# Patient Record
Sex: Male | Born: 1970 | Race: White | Hispanic: No | Marital: Married | Smoking: Never smoker
Health system: Southern US, Community
[De-identification: ages and names within clinical notes are randomized; demographics above are authoritative.]

## PROBLEM LIST (undated history)

## (undated) DIAGNOSIS — J45909 Unspecified asthma, uncomplicated: Secondary | ICD-10-CM

## (undated) DIAGNOSIS — E781 Pure hyperglyceridemia: Secondary | ICD-10-CM

## (undated) DIAGNOSIS — E785 Hyperlipidemia, unspecified: Secondary | ICD-10-CM

## (undated) DIAGNOSIS — H9192 Unspecified hearing loss, left ear: Secondary | ICD-10-CM

## (undated) DIAGNOSIS — I10 Essential (primary) hypertension: Secondary | ICD-10-CM

## (undated) DIAGNOSIS — F419 Anxiety disorder, unspecified: Secondary | ICD-10-CM

## (undated) DIAGNOSIS — IMO0002 Reserved for concepts with insufficient information to code with codable children: Secondary | ICD-10-CM

## (undated) DIAGNOSIS — N529 Male erectile dysfunction, unspecified: Secondary | ICD-10-CM

## (undated) DIAGNOSIS — T7840XA Allergy, unspecified, initial encounter: Secondary | ICD-10-CM

## (undated) DIAGNOSIS — G4700 Insomnia, unspecified: Secondary | ICD-10-CM

## (undated) HISTORY — DX: Reserved for concepts with insufficient information to code with codable children: IMO0002

## (undated) HISTORY — DX: Essential (primary) hypertension: I10

## (undated) HISTORY — PX: TONSILLECTOMY: SUR1361

## (undated) HISTORY — DX: Allergy, unspecified, initial encounter: T78.40XA

## (undated) HISTORY — DX: Anxiety disorder, unspecified: F41.9

## (undated) HISTORY — DX: Male erectile dysfunction, unspecified: N52.9

## (undated) HISTORY — DX: Pure hyperglyceridemia: E78.1

## (undated) HISTORY — DX: Insomnia, unspecified: G47.00

## (undated) HISTORY — DX: Unspecified asthma, uncomplicated: J45.909

## (undated) HISTORY — DX: Hyperlipidemia, unspecified: E78.5

## (undated) HISTORY — PX: WISDOM TOOTH EXTRACTION: SHX21

## (undated) HISTORY — PX: SPINAL CORD STIMULATOR IMPLANT: SHX2422

---

## 2000-10-01 ENCOUNTER — Encounter: Payer: Self-pay | Admitting: Family Medicine

## 2000-10-01 ENCOUNTER — Ambulatory Visit (HOSPITAL_COMMUNITY): Admission: RE | Admit: 2000-10-01 | Discharge: 2000-10-01 | Payer: Self-pay | Admitting: Family Medicine

## 2010-04-09 ENCOUNTER — Other Ambulatory Visit (HOSPITAL_COMMUNITY): Payer: Self-pay | Admitting: Orthopaedic Surgery

## 2010-04-09 DIAGNOSIS — M25579 Pain in unspecified ankle and joints of unspecified foot: Secondary | ICD-10-CM

## 2010-04-10 ENCOUNTER — Ambulatory Visit (HOSPITAL_COMMUNITY)
Admission: RE | Admit: 2010-04-10 | Discharge: 2010-04-10 | Disposition: A | Discharge status: Home / Self Care | Payer: BC Managed Care – PPO | Source: Ambulatory Visit | Attending: Orthopaedic Surgery | Admitting: Orthopaedic Surgery

## 2010-04-10 DIAGNOSIS — Z13820 Encounter for screening for osteoporosis: Secondary | ICD-10-CM | POA: Insufficient documentation

## 2010-04-10 DIAGNOSIS — M25579 Pain in unspecified ankle and joints of unspecified foot: Secondary | ICD-10-CM

## 2012-05-13 ENCOUNTER — Other Ambulatory Visit: Payer: Self-pay | Admitting: Nurse Practitioner

## 2012-05-16 ENCOUNTER — Other Ambulatory Visit: Payer: Self-pay | Admitting: *Deleted

## 2012-05-16 MED ORDER — ENALAPRIL MALEATE 10 MG PO TABS: 10.0000 mg | ORAL_TABLET | Freq: Every day | ORAL | Status: DC

## 2012-06-03 ENCOUNTER — Ambulatory Visit (INDEPENDENT_AMBULATORY_CARE_PROVIDER_SITE_OTHER): Payer: BC Managed Care – PPO | Admitting: Family Medicine

## 2012-06-03 ENCOUNTER — Encounter: Payer: Self-pay | Admitting: Family Medicine

## 2012-06-03 VITALS — BP 112/88 | Temp 99.0°F | Wt 233.2 lb

## 2012-06-03 DIAGNOSIS — J309 Allergic rhinitis, unspecified: Secondary | ICD-10-CM

## 2012-06-03 MED ORDER — METHYLPREDNISOLONE ACETATE 80 MG/ML IJ SUSP
80.0000 mg | Freq: Once | INTRAMUSCULAR | Status: AC
  Administered 2012-06-03: 80 mg via INTRAMUSCULAR

## 2012-06-03 MED ORDER — KETOCONAZOLE 2 % EX CREA: TOPICAL_CREAM | Freq: Two times a day (BID) | CUTANEOUS | Status: DC

## 2012-06-03 NOTE — Progress Notes (Signed)
  Subjective:    Patient ID: Trevor Rollins, male    DOB: 1971-01-10, 42 y.o.   MRN: [id number]  HPI Sig hx allergies. Due to start shots soon. On loratadine plus flonase. Benadryl. Ongoing cong and drainage despite. Little itchy in the eyes, but not bad.  Patient also has challenge with fungus on feet. Over-the-counter meds help a little but not a lot. Keeps coming back. Review of Systems    no fever, no chest pain, no dyspnea. ROS otherwise negative. Objective:   Physical Exam  Alert slight malaise. Vitals reviewed. HEENT significant nasal congestion I injection slight drainage neck supple. Lungs clear. Heart regular in rhythm. Feet Tenia pedis changes      Assessment & Plan:  Impression #1 allergic rhinitis resistant to antihistamines posteriorly nasal spray. Patient considering injections soon. #2 tenia pedis. Plan steroid injection today. Ketoconazole cream when necessary. Symptomatic care discussed. WSL

## 2012-08-01 ENCOUNTER — Encounter: Payer: Self-pay | Admitting: *Deleted

## 2012-08-02 ENCOUNTER — Encounter: Payer: Self-pay | Admitting: Family Medicine

## 2012-08-02 ENCOUNTER — Ambulatory Visit (INDEPENDENT_AMBULATORY_CARE_PROVIDER_SITE_OTHER): Payer: BC Managed Care – PPO | Admitting: Family Medicine

## 2012-08-02 ENCOUNTER — Encounter: Payer: Self-pay | Admitting: *Deleted

## 2012-08-02 VITALS — BP 134/81 | Temp 98.7°F | Wt 239.0 lb

## 2012-08-02 DIAGNOSIS — R059 Cough, unspecified: Secondary | ICD-10-CM

## 2012-08-02 DIAGNOSIS — F654 Pedophilia: Secondary | ICD-10-CM

## 2012-08-02 DIAGNOSIS — R05 Cough: Secondary | ICD-10-CM

## 2012-08-02 DIAGNOSIS — R5383 Other fatigue: Secondary | ICD-10-CM

## 2012-08-02 DIAGNOSIS — R5381 Other malaise: Secondary | ICD-10-CM

## 2012-08-02 DIAGNOSIS — J309 Allergic rhinitis, unspecified: Secondary | ICD-10-CM

## 2012-08-02 DIAGNOSIS — E785 Hyperlipidemia, unspecified: Secondary | ICD-10-CM

## 2012-08-02 DIAGNOSIS — F528 Other sexual dysfunction not due to a substance or known physiological condition: Secondary | ICD-10-CM

## 2012-08-02 DIAGNOSIS — Z79899 Other long term (current) drug therapy: Secondary | ICD-10-CM

## 2012-08-02 MED ORDER — METHYLPREDNISOLONE ACETATE 80 MG/ML IJ SUSP
80.0000 mg | Freq: Once | INTRAMUSCULAR | Status: AC
  Administered 2012-08-02: 80 mg via INTRAMUSCULAR

## 2012-08-02 MED ORDER — CLARITHROMYCIN 500 MG PO TABS: 500.0000 mg | ORAL_TABLET | Freq: Two times a day (BID) | ORAL | Status: AC

## 2012-08-02 NOTE — Progress Notes (Signed)
  Subjective:    Patient ID: Trevor Rollins, male    DOB: [birth year], 42 y.o.   MRN: [id number]  Cough This is a recurrent problem. The current episode started in the past 7 days. The problem has been gradually worsening. The problem occurs every few minutes. The cough is productive of purulent sputum. Pertinent negatives include no chest pain or chills. Nothing aggravates the symptoms. Risk factors for lung disease include animal exposure. He has tried nothing for the symptoms. The treatment provided moderate relief.   Started after bushhogging Loratadine and benadryl and flonase  Review of Systems  Constitutional: Negative for chills.  Respiratory: Positive for cough.   Cardiovascular: Negative for chest pain.       Objective:   Physical Exam Alert no apparent distress. Clear his throat intermittently during exam. HEENT mild nasal congestion. Pharynx normal lungs rare rhonchi heart regular in rhythm.       Assessment & Plan:  Impression rhinitis/bronchitis with both allergenic and infectious component. Plan Biaxin 500 twice a day 10 days. Maintain Flonase. Steroid injection. Patient to consider getting back with allergy specialist regarding shots.

## 2012-08-02 NOTE — Patient Instructions (Signed)
Do blood work fasting about a week before next visit

## 2012-08-19 LAB — BASIC METABOLIC PANEL
BUN: 21 mg/dL (ref 6–23)
CO2: 25 mEq/L (ref 19–32)
Calcium: 9.1 mg/dL (ref 8.4–10.5)
Chloride: 102 mEq/L (ref 96–112)
Creat: 0.93 mg/dL (ref 0.50–1.35)
Glucose, Bld: 98 mg/dL (ref 70–99)
Potassium: 4.3 mEq/L (ref 3.5–5.3)
Sodium: 139 mEq/L (ref 135–145)

## 2012-08-19 LAB — LIPID PANEL
Cholesterol: 199 mg/dL (ref 0–200)
HDL: 48 mg/dL (ref >39)
LDL Cholesterol: 109 mg/dL — ABNORMAL HIGH (ref 0–99)
Total CHOL/HDL Ratio: 4.1 Ratio
Triglycerides: 211 mg/dL — ABNORMAL HIGH (ref <150)
VLDL: 42 mg/dL — ABNORMAL HIGH (ref 0–40)

## 2012-08-19 LAB — HEPATIC FUNCTION PANEL
ALT: 49 U/L (ref 0–53)
AST: 35 U/L (ref 0–37)
Albumin: 4 g/dL (ref 3.5–5.2)
Alkaline Phosphatase: 83 U/L (ref 39–117)
Bilirubin, Direct: 0.1 mg/dL (ref 0.0–0.3)
Indirect Bilirubin: 0.4 mg/dL (ref 0.0–0.9)
Total Bilirubin: 0.5 mg/dL (ref 0.3–1.2)
Total Protein: 6.5 g/dL (ref 6.0–8.3)

## 2012-08-19 LAB — TESTOSTERONE: Testosterone: 277 ng/dL — ABNORMAL LOW (ref 300–890)

## 2012-08-31 ENCOUNTER — Ambulatory Visit: Payer: BC Managed Care – PPO | Admitting: Family Medicine

## 2012-09-08 ENCOUNTER — Encounter: Payer: Self-pay | Admitting: Family Medicine

## 2012-09-08 ENCOUNTER — Ambulatory Visit (INDEPENDENT_AMBULATORY_CARE_PROVIDER_SITE_OTHER): Payer: BC Managed Care – PPO | Admitting: Family Medicine

## 2012-09-08 VITALS — BP 142/94 | HR 110 | Wt 237.6 lb

## 2012-09-08 DIAGNOSIS — I10 Essential (primary) hypertension: Secondary | ICD-10-CM

## 2012-09-08 DIAGNOSIS — E291 Testicular hypofunction: Secondary | ICD-10-CM

## 2012-09-08 DIAGNOSIS — N529 Male erectile dysfunction, unspecified: Secondary | ICD-10-CM | POA: Insufficient documentation

## 2012-09-08 DIAGNOSIS — F411 Generalized anxiety disorder: Secondary | ICD-10-CM

## 2012-09-08 DIAGNOSIS — E785 Hyperlipidemia, unspecified: Secondary | ICD-10-CM

## 2012-09-08 DIAGNOSIS — J4520 Mild intermittent asthma, uncomplicated: Secondary | ICD-10-CM

## 2012-09-08 DIAGNOSIS — R7989 Other specified abnormal findings of blood chemistry: Secondary | ICD-10-CM | POA: Insufficient documentation

## 2012-09-08 DIAGNOSIS — J45909 Unspecified asthma, uncomplicated: Secondary | ICD-10-CM | POA: Insufficient documentation

## 2012-09-08 MED ORDER — FLUTICASONE PROPIONATE 50 MCG/ACT NA SUSP: 2.0000 | Freq: Every day | NASAL | Status: DC

## 2012-09-08 MED ORDER — ENALAPRIL MALEATE 10 MG PO TABS: 10.0000 mg | ORAL_TABLET | Freq: Every day | ORAL | Status: DC

## 2012-09-08 MED ORDER — CITALOPRAM HYDROBROMIDE 20 MG PO TABS: ORAL_TABLET | ORAL | Status: DC

## 2012-09-08 NOTE — Progress Notes (Signed)
  Subjective:    Patient ID: Trevor Rollins, male    DOB: [birth year], 42 y.o.   MRN: [id number]  HPI Results for orders placed in visit on 08/02/12  BASIC METABOLIC PANEL      Result Value Range   Sodium 139  135 - 145 mEq/L   Potassium 4.3  3.5 - 5.3 mEq/L   Chloride 102  96 - 112 mEq/L   CO2 25  19 - 32 mEq/L   Glucose, Bld 98  70 - 99 mg/dL   BUN 21  6 - 23 mg/dL   Creat 7.82  9.56 - 2.13 mg/dL   Calcium 9.1  8.4 - 08.6 mg/dL  TESTOSTERONE      Result Value Range   Testosterone 277 (*) 300 - 890 ng/dL  LIPID PANEL      Result Value Range   Cholesterol 199  0 - 200 mg/dL   Triglycerides 578 (*) <150 mg/dL   HDL 48  >46 mg/dL   Total CHOL/HDL Ratio 4.1     VLDL 42 (*) 0 - 40 mg/dL   LDL Cholesterol 962 (*) 0 - 99 mg/dL  HEPATIC FUNCTION PANEL      Result Value Range   Total Bilirubin 0.5  0.3 - 1.2 mg/dL   Bilirubin, Direct 0.1  0.0 - 0.3 mg/dL   Indirect Bilirubin 0.4  0.0 - 0.9 mg/dL   Alkaline Phosphatase 83  39 - 117 U/L   AST 35  0 - 37 U/L   ALT 49  0 - 53 U/L   Total Protein 6.5  6.0 - 8.3 g/dL   Albumin 4.0  3.5 - 5.2 g/dL   Exercise regular, watching it closely.  Diet overall not very good. Compliant with meds  Increased stress lately led to increased snacking  Gained weight.  Energy level not the best  celexa 40 patient has been taking just one quarter of a tablet of this daily.  Patient notes diminished libido. Also diminished ability to maintain a good correction. This is been coming on for some time. Patient's spouse was wondering about testosterone level.  Still significant stress at work though somewhat improved.  Review of Systems No chest pain no headache decent appetite no abdominal pain no change in bowel habits no blood in stools alert    Objective:   Physical Exam  Alert no acute distress. HEENT normal. Blood pressure good on repeat. Lungs clear. Heart regular rate and rhythm.      Assessment & Plan:  Impression #1 hypertension good  control. #2 chronic rhinitis good control. #3 hyperlipidemia discussed improved. #4 depression improved stable on low-dose medicine. #5 diminished libido #6 low testosterone discussed at great length. I am not in favor of supplementing with just mild decline in levels. Discussed at length with patient. Plan maintain usual meds. Decrease Celexa to one half 20 daily. Diet exercise discussed in encourage. Viagra 50 mg #8 one 2 hours before sex side effects benefits discussed easily 40 minutes spent with patient most in discussion. WSL

## 2012-12-19 ENCOUNTER — Encounter: Payer: Self-pay | Admitting: Family Medicine

## 2012-12-19 ENCOUNTER — Ambulatory Visit (INDEPENDENT_AMBULATORY_CARE_PROVIDER_SITE_OTHER): Payer: BC Managed Care – PPO | Admitting: Family Medicine

## 2012-12-19 VITALS — BP 110/80 | Temp 98.5°F | Ht 74.0 in | Wt 245.0 lb

## 2012-12-19 DIAGNOSIS — J329 Chronic sinusitis, unspecified: Secondary | ICD-10-CM

## 2012-12-19 MED ORDER — LEVOFLOXACIN 500 MG PO TABS: 500.0000 mg | ORAL_TABLET | Freq: Every day | ORAL | Status: AC

## 2012-12-19 NOTE — Progress Notes (Signed)
  Subjective:    Patient ID: Trevor Rollins, male    DOB: [birth year], 42 y.o.   MRN: [id number]  URI  This is a new problem. The current episode started 1 to 4 weeks ago. The problem has been unchanged. There has been no fever. Associated symptoms include congestion and coughing. He has tried antihistamine for the symptoms. The treatment provided no relief.    Out doors a lot cough  definietly worse at night  Uses benadryl, adding hydromet at night helps.  Using the loratadine and flonase, some uri symptoms in the family,   Cough productive   Review of Systems  HENT: Positive for congestion.   Respiratory: Positive for cough.    no vomiting no diarrhea no rash. ROS otherwise negative.   Objective:   Physical Exam  Alert HEENT moderate his congestion some frontal tenderness. Pharynx slight erythema neck supple. Lungs clear heart regular in rhythm.         Assessment & Plan:  Impression rhinosinusitis with secondary cough. Plan Hydromet 1 teaspoon each bedtime when necessary. Levaquin 500 daily 10 days. Symptomatic care discussed. WSL

## 2012-12-29 ENCOUNTER — Telehealth: Payer: Self-pay | Admitting: Family Medicine

## 2012-12-29 MED ORDER — PREDNISONE 20 MG PO TABS: ORAL_TABLET | ORAL | Status: DC

## 2012-12-29 NOTE — Telephone Encounter (Signed)
pred 20 three qd for three d, two qd for three d one qd for two d

## 2012-12-29 NOTE — Telephone Encounter (Signed)
States he is not feeling any better.  Can something else be prescribed?  Patient states maybe Prednisone will work  Scientist, forensic in [employer]

## 2012-12-29 NOTE — Telephone Encounter (Signed)
Med sent to pharm. Pt notified.  

## 2013-06-21 ENCOUNTER — Ambulatory Visit: Payer: BC Managed Care – PPO | Admitting: Family Medicine

## 2013-06-26 ENCOUNTER — Ambulatory Visit (INDEPENDENT_AMBULATORY_CARE_PROVIDER_SITE_OTHER): Payer: BC Managed Care – PPO | Admitting: Family Medicine

## 2013-06-26 ENCOUNTER — Encounter: Payer: Self-pay | Admitting: Family Medicine

## 2013-06-26 VITALS — BP 142/90 | Temp 98.3°F | Ht 74.0 in | Wt 246.0 lb

## 2013-06-26 DIAGNOSIS — J309 Allergic rhinitis, unspecified: Secondary | ICD-10-CM

## 2013-06-26 MED ORDER — SILDENAFIL CITRATE 20 MG PO TABS: ORAL_TABLET | ORAL | Status: DC

## 2013-06-26 MED ORDER — METHYLPREDNISOLONE ACETATE 40 MG/ML IJ SUSP
80.0000 mg | Freq: Once | INTRAMUSCULAR | Status: AC
  Administered 2013-06-26: 80 mg via INTRAMUSCULAR

## 2013-06-26 NOTE — Progress Notes (Signed)
   Subjective:    Patient ID: Trevor Rollins, male    DOB: 08/07/1970, 43 y.o.   MRN: [id number]  HPI Patient is here today for environmental allergies.  He has headaches, aching all over., alswo sig stress with job  Using the inhaler for wheezing, Some prod cough but not much  Congestion  Cough  He does get allergy shots once a week.  No concerns/questions  Re emerged past couple weeks, on the go,  Patient also notes ongoing challenges with erectile dysfunction. Would like to try generic Viagra.  Review of Systems No fever no chills no nausea no vomiting no diarrhea ROS otherwise negative    Objective:   Physical Exam Alert no apparent distress. Mild malaise. HEENT moderate nasal congestion eyes injected pharynx normal. Lungs clear. Heart regular in rhythm.      Assessment & Plan:  Impression 1 allergic rhinitis flare. Patient has been helped very significantly by injections in the past. #2 erectile dysfunction discussed plan trial of generic sildenafil. Depo-Medrol injection. WSL

## 2013-07-05 ENCOUNTER — Other Ambulatory Visit: Payer: Self-pay | Admitting: Family Medicine

## 2013-07-10 ENCOUNTER — Telehealth: Payer: Self-pay | Admitting: Family Medicine

## 2013-07-10 NOTE — Telephone Encounter (Signed)
Rx DENIAL received from [facility] for pt's Sildenafil, state that plan doesn't cover that medicine for the Dx stated, called to notify pt, pt states he knew was denied and that he paid out of pocket

## 2013-07-18 ENCOUNTER — Other Ambulatory Visit: Payer: Self-pay | Admitting: Family Medicine

## 2013-07-18 NOTE — Telephone Encounter (Signed)
Last seen 06/26/13 (sick)

## 2013-08-09 ENCOUNTER — Other Ambulatory Visit: Payer: Self-pay | Admitting: Family Medicine

## 2013-08-10 MED ORDER — CITALOPRAM HYDROBROMIDE 20 MG PO TABS: ORAL_TABLET | ORAL | Status: DC

## 2013-08-10 NOTE — Addendum Note (Signed)
Addended byOneal Deputy: Bence Trapp D on: 08/10/2013 11:26 AM   Modules accepted: Orders

## 2013-10-09 ENCOUNTER — Ambulatory Visit (INDEPENDENT_AMBULATORY_CARE_PROVIDER_SITE_OTHER): Payer: BC Managed Care – PPO | Admitting: Nurse Practitioner

## 2013-10-09 VITALS — BP 150/96 | Ht 73.5 in | Wt 246.0 lb

## 2013-10-09 DIAGNOSIS — G9050 Complex regional pain syndrome I, unspecified: Secondary | ICD-10-CM

## 2013-10-09 MED ORDER — DIAZEPAM 5 MG PO TABS: 5.0000 mg | ORAL_TABLET | Freq: Every evening | ORAL | Status: DC | PRN

## 2013-10-09 MED ORDER — TIZANIDINE HCL 4 MG PO TABS: 4.0000 mg | ORAL_TABLET | Freq: Three times a day (TID) | ORAL | Status: DC | PRN

## 2013-10-12 ENCOUNTER — Encounter: Payer: Self-pay | Admitting: Nurse Practitioner

## 2013-10-12 NOTE — Progress Notes (Signed)
Subjective:  Presents for complaints of exacerbation of his muscle spasms. Particularly in the back and the legs. Having spasms after exercise. Has RSD with a spinal stimulator. Has used Zanaflex in the past to help with spasms. Having some difficulty sleeping due to spasms. Also sees a Land.  Objective:   BP 150/96  Ht 6' 1.5" (1.867 m)  Wt 246 lb (111.585 kg)  BMI 32.01 kg/m2 NAD. Alert, oriented. Lungs clear. Heart RRR.   Assessment:  Problem List Items Addressed This Visit   None    Visit Diagnoses   RSD (reflex sympathetic dystrophy)    -  Primary    Relevant Medications       tiZANidine (ZANAFLEX) tablet       diazepam (VALIUM) tablet       Plan:  Meds ordered this encounter  Medications  . clobetasol cream (TEMOVATE) 0.05 %    Sig:   . tiZANidine (ZANAFLEX) 4 MG tablet    Sig: Take 1 tablet (4 mg total) by mouth every 8 (eight) hours as needed for muscle spasms.    Dispense:  30 tablet    Refill:  2    Order Specific Question:  Supervising Provider    Answer:  Merlyn Albert [[id number]]  . diazepam (VALIUM) 5 MG tablet    Sig: Take 1 tablet (5 mg total) by mouth at bedtime as needed for muscle spasms. Wait 4 hours between zanaflex and valium dose    Dispense:  30 tablet    Refill:  0    Order Specific Question:  Supervising Provider    Answer:  Merlyn Albert [[id number]]   Zanaflex for daytime use. Valium for HS use prn. Use sparingly. Call back if no improvement.

## 2013-12-05 ENCOUNTER — Other Ambulatory Visit: Payer: Self-pay | Admitting: Family Medicine

## 2013-12-21 ENCOUNTER — Telehealth: Payer: Self-pay | Admitting: *Deleted

## 2013-12-21 MED ORDER — PREDNISONE 20 MG PO TABS: 20.0000 mg | ORAL_TABLET | Freq: Every day | ORAL | Status: DC

## 2013-12-21 NOTE — Telephone Encounter (Signed)
Patient just has a cough that will not go away. He would like Prednisone.   Dr. Brett CanalesSteve approved. Pt notified and verbalized understanding. Will call back if s/s worsen.

## 2013-12-29 ENCOUNTER — Telehealth: Payer: Self-pay | Admitting: Family Medicine

## 2013-12-29 MED ORDER — AZITHROMYCIN 250 MG PO TABS: ORAL_TABLET | ORAL | Status: DC

## 2013-12-29 MED ORDER — ALBUTEROL SULFATE HFA 108 (90 BASE) MCG/ACT IN AERS: 2.0000 | INHALATION_SPRAY | Freq: Four times a day (QID) | RESPIRATORY_TRACT | Status: DC | PRN

## 2013-12-29 NOTE — Telephone Encounter (Signed)
Medication sent to pharmacy. Patient was notified and told to call back if symptoms persist.

## 2013-12-29 NOTE — Telephone Encounter (Signed)
Tell pt normally we'd see but way past full for today, vent mdi two sprays qid prn cough/sheezing.., also z pk, sched appt if persists

## 2013-12-29 NOTE — Telephone Encounter (Signed)
Patient last seen 10/09/13 for spasms. Patient had prednisone sent in for him on 12/21/13 for cough and wheezing. Patient states that he is still coughing and wheezing and feels he may need to be seen or can something be sent in for him. (Has not been recently seen for this issue)

## 2013-12-29 NOTE — Telephone Encounter (Signed)
Ntsw, get more descrip and what pt wants

## 2013-12-29 NOTE — Telephone Encounter (Signed)
Patient said that he still has a persistent cough and prednisone did not help.  He was told to call back is symptoms persists and wants to know if he needs to be seen.  [facility]

## 2014-01-12 ENCOUNTER — Other Ambulatory Visit: Payer: Self-pay | Admitting: Family Medicine

## 2014-02-22 ENCOUNTER — Other Ambulatory Visit: Payer: Self-pay | Admitting: Family Medicine

## 2014-02-26 ENCOUNTER — Other Ambulatory Visit (HOSPITAL_COMMUNITY): Payer: Self-pay | Admitting: Orthopaedic Surgery

## 2014-02-28 ENCOUNTER — Encounter (HOSPITAL_COMMUNITY): Payer: Self-pay | Admitting: *Deleted

## 2014-02-28 MED ORDER — CLINDAMYCIN PHOSPHATE 900 MG/50ML IV SOLN
900.0000 mg | INTRAVENOUS | Status: AC
  Administered 2014-03-01: 900 mg via INTRAVENOUS
  Filled 2014-02-28: qty 50

## 2014-02-28 NOTE — Progress Notes (Signed)
Pt denies SOB, chest pain, and being under the care of a cardiologist. Pt denies having a stress test, echo, and cardiac cath. Pt denies having an EKG and chest x ray within the last year. Pt made aware to stop taking Aspirin, vitamins, and herbal medications. Do not take any NSAIDs ie: Ibuprofen, Advil, Naproxen or any medication containing Aspirin.

## 2014-02-28 NOTE — H&P (Signed)
PREOPERATIVE H&P  Chief Complaint: Right trimalleolar ankle fracture  HPI: Trevor Rollins is a 44 y.o. male who presents for surgical treatment of Right trimalleolar ankle fracture.  He denies any changes in medical history.  Past Medical History  Diagnosis Date  . DDD (degenerative disc disease)   . Allergy   . Anxiety   . Hypertension   . Hyperlipidemia   . Insomnia   . Asthma   . Hypertriglyceridemia   . ED (erectile dysfunction)   . Deafness in left ear    Past Surgical History  Procedure Laterality Date  . Tonsillectomy    . Spinal cord stimulator implant     History   Social History  . Marital Status: Married    Spouse Name: N/A    Number of Children: N/A  . Years of Education: N/A   Social History Main Topics  . Smoking status: Never Smoker   . Smokeless tobacco: Never Used  . Alcohol Use: Yes     Comment: occasional  . Drug Use: No  . Sexual Activity: None   Other Topics Concern  . None   Social History Narrative   Family History  Problem Relation Age of Onset  . Cancer Sister   . Stroke Other    Allergies  Allergen Reactions  . Penicillins Other (See Comments)    Childhood reaction "redness"    Prior to Admission medications   Medication Sig Start Date End Date Taking? Authorizing Provider  albuterol (PROVENTIL HFA;VENTOLIN HFA) 108 (90 BASE) MCG/ACT inhaler Inhale 2 puffs into the lungs 4 (four) times daily as needed for wheezing or shortness of breath. 12/29/13  Yes Merlyn Albert, MD  diazepam (VALIUM) 5 MG tablet Take 1 tablet (5 mg total) by mouth at bedtime as needed for muscle spasms. Wait 4 hours between zanaflex and valium dose 10/09/13  Yes Campbell Riches, NP  enalapril (VASOTEC) 10 MG tablet TAKE 1 TABLET BY MOUTH EVERY DAY 02/23/14  Yes Merlyn Albert, MD  escitalopram (LEXAPRO) 10 MG tablet Take 10 mg by mouth daily.   Yes Historical Provider, MD  loratadine (CLARITIN) 10 MG tablet Take 10 mg by mouth daily.   Yes Historical  Provider, MD  oxyCODONE (OXY IR/ROXICODONE) 5 MG immediate release tablet Take 5 mg by mouth 4 (four) times daily as needed (for pain).   Yes Historical Provider, MD  sildenafil (REVATIO) 20 MG tablet Two to three tabs one to two hrs prior to sex Patient taking differently: Take 40-60 mg by mouth once as needed (1 to 2 hours prior to sex for E. D.). Two to three tabs one to two hrs prior to sex 06/26/13  Yes Merlyn Albert, MD  tiZANidine (ZANAFLEX) 4 MG tablet Take 1 tablet (4 mg total) by mouth every 8 (eight) hours as needed for muscle spasms. 10/09/13  Yes Campbell Riches, NP  azithromycin (ZITHROMAX Z-PAK) 250 MG tablet Take 2 tablets (500 mg) on  Day 1,  followed by 1 tablet (250 mg) once daily on Days 2 through 5. Patient not taking: Reported on 02/27/2014 12/29/13   Merlyn Albert, MD  citalopram (CELEXA) 20 MG tablet TAKE 1/2 TABLET BY MOUTH EVERY DAY Patient not taking: Reported on 02/27/2014 12/05/13   Merlyn Albert, MD  fluticasone [facility]) 50 MCG/ACT nasal spray USE 2 SPRAYS IN [facility] NOSTRIL EVERY DAY Patient not taking: Reported on 02/27/2014    Merlyn Albert, MD  ketoconazole (NIZORAL) 2 % cream APPLY  TOPICALLY TWICE DAILY Patient not taking: Reported on 02/27/2014 07/18/13   Babs SciaraScott A Luking, MD  predniSONE (DELTASONE) 20 MG tablet Take 1 tablet (20 mg total) by mouth daily. Patient not taking: Reported on 02/27/2014 12/21/13   Merlyn AlbertWilliam S Luking, MD     Positive ROS: All other systems have been reviewed and were otherwise negative with the exception of those mentioned in the HPI and as above.  Physical Exam: General: Alert, no acute distress Cardiovascular: No pedal edema Respiratory: No cyanosis, no use of accessory musculature GI: abdomen soft Skin: No lesions in the area of chief complaint Neurologic: Sensation intact distally Psychiatric: Patient is competent for consent with normal mood and affect Lymphatic: no lymphedema  MUSCULOSKELETAL: exam  stable   Assessment: Right trimalleolar ankle fracture  Plan: Plan for Procedure(s): OPEN REDUCTION INTERNAL FIXATION (ORIF) RIGHT ANKLE FRACTURE  The risks benefits and alternatives were discussed with the patient including but not limited to the risks of nonoperative treatment, versus surgical intervention including infection, bleeding, nerve injury,  blood clots, cardiopulmonary complications, morbidity, mortality, among others, and they were willing to proceed.   Trevor Rollins, Trevor Michael, MD   02/28/2014 7:14 PM

## 2014-03-01 ENCOUNTER — Encounter (HOSPITAL_COMMUNITY)
Admission: RE | Disposition: A | Discharge status: Home / Self Care | Payer: Self-pay | Source: Ambulatory Visit | Attending: Orthopaedic Surgery

## 2014-03-01 ENCOUNTER — Ambulatory Visit (HOSPITAL_COMMUNITY): Payer: BC Managed Care – PPO | Admitting: Certified Registered Nurse Anesthetist

## 2014-03-01 ENCOUNTER — Encounter (HOSPITAL_COMMUNITY): Payer: Self-pay | Admitting: *Deleted

## 2014-03-01 ENCOUNTER — Ambulatory Visit (HOSPITAL_COMMUNITY): Payer: BC Managed Care – PPO

## 2014-03-01 ENCOUNTER — Ambulatory Visit (HOSPITAL_COMMUNITY)
Admission: RE | Admit: 2014-03-01 | Discharge: 2014-03-01 | Disposition: A | Discharge status: Home / Self Care | Payer: BC Managed Care – PPO | Source: Ambulatory Visit | Attending: Orthopaedic Surgery | Admitting: Orthopaedic Surgery

## 2014-03-01 DIAGNOSIS — G4700 Insomnia, unspecified: Secondary | ICD-10-CM | POA: Insufficient documentation

## 2014-03-01 DIAGNOSIS — J45909 Unspecified asthma, uncomplicated: Secondary | ICD-10-CM | POA: Diagnosis not present

## 2014-03-01 DIAGNOSIS — F419 Anxiety disorder, unspecified: Secondary | ICD-10-CM | POA: Insufficient documentation

## 2014-03-01 DIAGNOSIS — Z79899 Other long term (current) drug therapy: Secondary | ICD-10-CM | POA: Diagnosis not present

## 2014-03-01 DIAGNOSIS — Y929 Unspecified place or not applicable: Secondary | ICD-10-CM | POA: Diagnosis not present

## 2014-03-01 DIAGNOSIS — N529 Male erectile dysfunction, unspecified: Secondary | ICD-10-CM | POA: Insufficient documentation

## 2014-03-01 DIAGNOSIS — S82851A Displaced trimalleolar fracture of right lower leg, initial encounter for closed fracture: Secondary | ICD-10-CM | POA: Diagnosis present

## 2014-03-01 DIAGNOSIS — E785 Hyperlipidemia, unspecified: Secondary | ICD-10-CM | POA: Insufficient documentation

## 2014-03-01 DIAGNOSIS — Y939 Activity, unspecified: Secondary | ICD-10-CM | POA: Insufficient documentation

## 2014-03-01 DIAGNOSIS — Z880 Allergy status to penicillin: Secondary | ICD-10-CM | POA: Insufficient documentation

## 2014-03-01 DIAGNOSIS — X58XXXA Exposure to other specified factors, initial encounter: Secondary | ICD-10-CM | POA: Diagnosis not present

## 2014-03-01 DIAGNOSIS — I10 Essential (primary) hypertension: Secondary | ICD-10-CM | POA: Diagnosis not present

## 2014-03-01 DIAGNOSIS — H918X2 Other specified hearing loss, left ear: Secondary | ICD-10-CM | POA: Diagnosis not present

## 2014-03-01 DIAGNOSIS — S82891A Other fracture of right lower leg, initial encounter for closed fracture: Secondary | ICD-10-CM

## 2014-03-01 HISTORY — DX: Unspecified hearing loss, left ear: H91.92

## 2014-03-01 HISTORY — PX: ORIF ANKLE FRACTURE: SHX5408

## 2014-03-01 LAB — CBC
HCT: 44.3 % (ref 39.0–52.0)
Hemoglobin: 14.7 g/dL (ref 13.0–17.0)
MCH: 31.4 pg (ref 26.0–34.0)
MCHC: 33.2 g/dL (ref 30.0–36.0)
MCV: 94.7 fL (ref 78.0–100.0)
Platelets: 261 10*3/uL (ref 150–400)
RBC: 4.68 MIL/uL (ref 4.22–5.81)
RDW: 12.9 % (ref 11.5–15.5)
WBC: 10 10*3/uL (ref 4.0–10.5)

## 2014-03-01 LAB — BASIC METABOLIC PANEL
Anion gap: 10 (ref 5–15)
BUN: 11 mg/dL (ref 6–23)
CO2: 25 mmol/L (ref 19–32)
Calcium: 9.5 mg/dL (ref 8.4–10.5)
Chloride: 101 mEq/L (ref 96–112)
Creatinine, Ser: 1.02 mg/dL (ref 0.50–1.35)
GFR calc Af Amer: >90 mL/min (ref >90)
GFR calc non Af Amer: 88 mL/min — ABNORMAL LOW (ref >90)
Glucose, Bld: 106 mg/dL — ABNORMAL HIGH (ref 70–99)
Potassium: 4.5 mmol/L (ref 3.5–5.1)
Sodium: 136 mmol/L (ref 135–145)

## 2014-03-01 LAB — SURGICAL PCR SCREEN
MRSA, PCR: NEGATIVE
Staphylococcus aureus: NEGATIVE

## 2014-03-01 SURGERY — OPEN REDUCTION INTERNAL FIXATION (ORIF) ANKLE FRACTURE
Anesthesia: General | Laterality: Right

## 2014-03-01 MED ORDER — FENTANYL CITRATE 0.05 MG/ML IJ SOLN
INTRAMUSCULAR | Status: DC | PRN
Start: 2014-03-01 — End: 2014-03-01
  Administered 2014-03-01 (×4): 50 ug via INTRAVENOUS
  Administered 2014-03-01 (×2): 100 ug via INTRAVENOUS

## 2014-03-01 MED ORDER — ASPIRIN EC 325 MG PO TBEC: 325.0000 mg | DELAYED_RELEASE_TABLET | Freq: Two times a day (BID) | ORAL | Status: DC

## 2014-03-01 MED ORDER — 0.9 % SODIUM CHLORIDE (POUR BTL) OPTIME
TOPICAL | Status: DC | PRN
  Administered 2014-03-01: 1000 mL

## 2014-03-01 MED ORDER — MIDAZOLAM HCL 2 MG/2ML IJ SOLN
INTRAMUSCULAR | Status: AC
  Administered 2014-03-01: 2 mg
  Filled 2014-03-01: qty 2

## 2014-03-01 MED ORDER — ONDANSETRON HCL 4 MG/2ML IJ SOLN: 4.0000 mg | Freq: Once | INTRAMUSCULAR | Status: DC | PRN

## 2014-03-01 MED ORDER — OXYCODONE HCL 5 MG PO TABS: 5.0000 mg | ORAL_TABLET | Freq: Once | ORAL | Status: DC | PRN

## 2014-03-01 MED ORDER — FENTANYL CITRATE 0.05 MG/ML IJ SOLN
INTRAMUSCULAR | Status: AC
  Administered 2014-03-01: 100 ug
  Filled 2014-03-01: qty 2

## 2014-03-01 MED ORDER — DEXAMETHASONE SODIUM PHOSPHATE 4 MG/ML IJ SOLN
INTRAMUSCULAR | Status: DC | PRN
  Administered 2014-03-01: 4 mg via INTRAVENOUS

## 2014-03-01 MED ORDER — DEXAMETHASONE SODIUM PHOSPHATE 4 MG/ML IJ SOLN
INTRAMUSCULAR | Status: AC
  Filled 2014-03-01: qty 1

## 2014-03-01 MED ORDER — LIDOCAINE HCL (CARDIAC) 20 MG/ML IV SOLN
INTRAVENOUS | Status: AC
  Filled 2014-03-01: qty 5

## 2014-03-01 MED ORDER — MUPIROCIN 2 % EX OINT
TOPICAL_OINTMENT | CUTANEOUS | Status: AC
  Administered 2014-03-01: 1
  Filled 2014-03-01: qty 22

## 2014-03-01 MED ORDER — FENTANYL CITRATE 0.05 MG/ML IJ SOLN
INTRAMUSCULAR | Status: AC
  Filled 2014-03-01: qty 5

## 2014-03-01 MED ORDER — OXYCODONE HCL 5 MG/5ML PO SOLN: 5.0000 mg | Freq: Once | ORAL | Status: DC | PRN

## 2014-03-01 MED ORDER — LACTATED RINGERS IV SOLN
INTRAVENOUS | Status: DC
  Administered 2014-03-01: 50 mL/h via INTRAVENOUS

## 2014-03-01 MED ORDER — LIDOCAINE HCL (CARDIAC) 20 MG/ML IV SOLN
INTRAVENOUS | Status: DC | PRN
  Administered 2014-03-01: 50 mg via INTRAVENOUS

## 2014-03-01 MED ORDER — ONDANSETRON HCL 4 MG/2ML IJ SOLN
INTRAMUSCULAR | Status: AC
  Filled 2014-03-01: qty 2

## 2014-03-01 MED ORDER — OXYCODONE HCL 5 MG PO TABS: 5.0000 mg | ORAL_TABLET | ORAL | Status: DC | PRN

## 2014-03-01 MED ORDER — ONDANSETRON HCL 4 MG/2ML IJ SOLN
INTRAMUSCULAR | Status: DC | PRN
  Administered 2014-03-01: 4 mg via INTRAVENOUS

## 2014-03-01 MED ORDER — PROPOFOL 10 MG/ML IV BOLUS
INTRAVENOUS | Status: DC | PRN
  Administered 2014-03-01: 200 mg via INTRAVENOUS

## 2014-03-01 MED ORDER — PROPOFOL 10 MG/ML IV BOLUS
INTRAVENOUS | Status: AC
  Filled 2014-03-01: qty 20

## 2014-03-01 MED ORDER — HYDROMORPHONE HCL 1 MG/ML IJ SOLN: 0.2500 mg | INTRAMUSCULAR | Status: DC | PRN

## 2014-03-01 SURGICAL SUPPLY — 77 items
BANDAGE ELASTIC 4 VELCRO ST LF (GAUZE/BANDAGES/DRESSINGS) IMPLANT
BANDAGE ELASTIC 6 VELCRO ST LF (GAUZE/BANDAGES/DRESSINGS) ×2 IMPLANT
BANDAGE ESMARK 6X9 LF (GAUZE/BANDAGES/DRESSINGS) ×1 IMPLANT
BIT DRILL 3.5X122MM AO FIT (BIT) ×2 IMPLANT
BIT DRILL CANN 2.7 (BIT) ×2
BIT DRILL CANN 2.7MM (BIT) ×1
BIT DRILL SRG 2.7XCANN AO CPLG (BIT) IMPLANT
BIT DRL SRG 2.7XCANN AO CPLNG (BIT) ×1
BNDG CMPR 9X6 STRL LF SNTH (GAUZE/BANDAGES/DRESSINGS) ×1
BNDG COHESIVE 4X5 TAN STRL (GAUZE/BANDAGES/DRESSINGS) ×3 IMPLANT
BNDG COHESIVE 6X5 TAN STRL LF (GAUZE/BANDAGES/DRESSINGS) ×3 IMPLANT
BNDG ESMARK 6X9 LF (GAUZE/BANDAGES/DRESSINGS) ×3
BUCKET CAST 5QT PAPER WAX WHT (CAST SUPPLIES) ×2 IMPLANT
CANISTER SUCT 3000ML (MISCELLANEOUS) ×1 IMPLANT
COVER SURGICAL LIGHT HANDLE (MISCELLANEOUS) ×3 IMPLANT
CUFF TOURNIQUET SINGLE 34IN LL (TOURNIQUET CUFF) ×2 IMPLANT
DRAPE C-ARM 42X72 X-RAY (DRAPES) ×3 IMPLANT
DRAPE C-ARMOR (DRAPES) ×3 IMPLANT
DRAPE IMP U-DRAPE 54X76 (DRAPES) ×3 IMPLANT
DRAPE U-SHAPE 47X51 STRL (DRAPES) ×6 IMPLANT
DRILL 2.6X122MM WL AO SHAFT (BIT) ×2 IMPLANT
DRSG PAD ABDOMINAL 8X10 ST (GAUZE/BANDAGES/DRESSINGS) ×2 IMPLANT
DURAPREP 26ML APPLICATOR (WOUND CARE) ×3 IMPLANT
ELECT CAUTERY BLADE 6.4 (BLADE) ×3 IMPLANT
ELECT REM PT RETURN 9FT ADLT (ELECTROSURGICAL) ×3
ELECTRODE REM PT RTRN 9FT ADLT (ELECTROSURGICAL) ×1 IMPLANT
FACESHIELD WRAPAROUND (MASK) ×3 IMPLANT
FACESHIELD WRAPAROUND OR TEAM (MASK) ×1 IMPLANT
GAUZE SPONGE 4X4 12PLY STRL (GAUZE/BANDAGES/DRESSINGS) ×3 IMPLANT
GAUZE XEROFORM 5X9 LF (GAUZE/BANDAGES/DRESSINGS) ×3 IMPLANT
GLOVE NEODERM STRL 7.5 LF PF (GLOVE) ×1 IMPLANT
GLOVE SURG NEODERM 7.5  LF PF (GLOVE) ×2
GLOVE SURG SYN 7.5  E (GLOVE) ×2
GLOVE SURG SYN 7.5 E (GLOVE) ×1 IMPLANT
GLOVE SURG SYN 7.5 PF PI (GLOVE) ×1 IMPLANT
GOWN STRL REIN XL XLG (GOWN DISPOSABLE) ×9 IMPLANT
K-WIRE ORTHOPEDIC 1.4X150L (WIRE) ×6
KIT BASIN OR (CUSTOM PROCEDURE TRAY) ×3 IMPLANT
KIT ROOM TURNOVER OR (KITS) ×3 IMPLANT
KWIRE ORTHOPEDIC 1.4X150L (WIRE) IMPLANT
NDL HYPO 25GX1X1/2 BEV (NEEDLE) IMPLANT
NEEDLE HYPO 25GX1X1/2 BEV (NEEDLE) IMPLANT
NS IRRIG 1000ML POUR BTL (IV SOLUTION) ×3 IMPLANT
PACK ORTHO EXTREMITY (CUSTOM PROCEDURE TRAY) ×3 IMPLANT
PAD ARMBOARD 7.5X6 YLW CONV (MISCELLANEOUS) ×6 IMPLANT
PAD CAST 3X4 CTTN HI CHSV (CAST SUPPLIES) ×2 IMPLANT
PADDING CAST ABS 3INX4YD NS (CAST SUPPLIES) ×2
PADDING CAST ABS 6INX4YD NS (CAST SUPPLIES) ×2
PADDING CAST ABS COTTON 3X4 (CAST SUPPLIES) IMPLANT
PADDING CAST ABS COTTON 6X4 NS (CAST SUPPLIES) IMPLANT
PADDING CAST COTTON 3X4 STRL (CAST SUPPLIES) ×6
PADDING CAST COTTON 6X4 STRL (CAST SUPPLIES) ×3 IMPLANT
PADDING CAST SYN 6 (CAST SUPPLIES) ×8
PADDING CAST SYNTHETIC 6X4 NS (CAST SUPPLIES) IMPLANT
PLATE FIBULA 4H (Plate) ×2 IMPLANT
SCREW BONE 14MMX3.5MM (Screw) ×2 IMPLANT
SCREW BONE 18 (Screw) ×2 IMPLANT
SCREW BONE 3.5X20MM (Screw) ×4 IMPLANT
SCREW BONE NON-LCKING 3.5X12MM (Screw) ×4 IMPLANT
SCREW CANNULATED 4.0X36MM PT (Screw) ×4 IMPLANT
SCREW LOCKING 3.5X16MM (Screw) ×4 IMPLANT
SCREW LOCKING 3.5X18MM (Screw) ×4 IMPLANT
SPLINT FIBERGLASS 4X30 (CAST SUPPLIES) ×4 IMPLANT
SPONGE LAP 18X18 X RAY DECT (DISPOSABLE) IMPLANT
SUCTION FRAZIER TIP 10 FR DISP (SUCTIONS) ×3 IMPLANT
SUT ETHILON 2 0 FS 18 (SUTURE) IMPLANT
SUT ETHILON 3 0 PS 1 (SUTURE) ×2 IMPLANT
SUT VIC AB 0 CT1 27 (SUTURE)
SUT VIC AB 0 CT1 27XBRD ANBCTR (SUTURE) IMPLANT
SUT VIC AB 2-0 CT1 27 (SUTURE) ×3
SUT VIC AB 2-0 CT1 TAPERPNT 27 (SUTURE) IMPLANT
SYR CONTROL 10ML LL (SYRINGE) IMPLANT
TOWEL OR 17X24 6PK STRL BLUE (TOWEL DISPOSABLE) ×3 IMPLANT
TOWEL OR 17X26 10 PK STRL BLUE (TOWEL DISPOSABLE) ×8 IMPLANT
TUBE CONNECTING 12'X1/4 (SUCTIONS) ×1
TUBE CONNECTING 12X1/4 (SUCTIONS) ×2 IMPLANT
WATER STERILE IRR 1000ML POUR (IV SOLUTION) ×3 IMPLANT

## 2014-03-01 NOTE — Anesthesia Postprocedure Evaluation (Signed)
  Anesthesia Post-op Note  Patient: Trevor Rollins  Procedure(s) Performed: Procedure(s): OPEN REDUCTION INTERNAL FIXATION (ORIF) RIGHT ANKLE FRACTURE (Right)  Patient Location: PACU  Anesthesia Type:General  Level of Consciousness: awake, alert , oriented and patient cooperative  Airway and Oxygen Therapy: Patient Spontanous Breathing  Post-op Pain: mild  Post-op Assessment: Post-op Vital signs reviewed, Patient's Cardiovascular Status Stable, Respiratory Function Stable, Patent Airway, No signs of Nausea or vomiting and Pain level controlled  Post-op Vital Signs: stable  Last Vitals:  Filed Vitals:   03/01/14 1713  BP: 154/91  Pulse: 100  Temp: 36.7 C  Resp: 17    Complications: No apparent anesthesia complications

## 2014-03-01 NOTE — Addendum Note (Signed)
Addendum  created 03/01/14 1744 by Kipp Broodavid Brynleigh Sequeira, MD   Modules edited: Anesthesia Blocks and Procedures, Clinical Notes   Clinical Notes:  File: [id number]

## 2014-03-01 NOTE — Anesthesia Procedure Notes (Addendum)
Procedure Name: LMA Insertion Date/Time: 03/01/2014 3:21 PM Performed by: Charm BargesBUTLER, Lavanya Roa R Pre-anesthesia Checklist: Patient identified, Emergency Drugs available, Suction available, Patient being monitored and Timeout performed Patient Re-evaluated:Patient Re-evaluated prior to inductionOxygen Delivery Method: Circle system utilized Preoxygenation: Pre-oxygenation with 100% oxygen Intubation Type: IV induction Ventilation: Mask ventilation without difficulty LMA: LMA inserted LMA Size: 5.0 Number of attempts: 1 Placement Confirmation: positive ETCO2 and breath sounds checked- equal and bilateral Tube secured with: Tape Dental Injury: Teeth and Oropharynx as per pre-operative assessment    Anesthesia Regional Block:  Popliteal block  Pre-Anesthetic Checklist: ,, timeout performed, Correct Patient, Correct Site, Correct Laterality, Correct Procedure, Correct Position, site marked, Risks and benefits discussed,  Surgical consent,  Pre-op evaluation,  At surgeon's request and post-op pain management  Laterality: Right  Prep: chloraprep       Needles:  Injection technique: Single-shot  Needle Type: Echogenic Stimulator Needle     Needle Length: 9cm 9 cm Needle Gauge: 22 and 22 G    Additional Needles:  Procedures: ultrasound guided (picture in chart) Popliteal block Narrative:  Start time: 03/01/2014 2:43 PM End time: 03/01/2014 2:52 PM Injection made incrementally with aspirations every 5 mL.  Additional Notes: 30 cc 0.5% Bupivacaine with 1:200 epi injected easily

## 2014-03-01 NOTE — Discharge Instructions (Signed)
° ° °  1. Keep splint clean and dry °2. Elevate foot above level of the heart °3. Take aspirin to prevent blood clots °4. Take pain meds as needed °5. Strict non weight bearing to operative extremity ° °

## 2014-03-01 NOTE — OR Nursing (Signed)
Patient has spinal cord stimulator in abdomen on right side of body. Cautery pad could not be placed on the right leg due to the application of a tourniquet. Therefore, the pad was placed on the left thigh.  Dr. Roda ShuttersXu made aware.  Patient stated that he is deaf in left ear. All OR team members were made aware so that communication could occur appropriately.

## 2014-03-01 NOTE — Transfer of Care (Signed)
Immediate Anesthesia Transfer of Care Note  Patient: Trevor Rollins  Procedure(s) Performed: Procedure(s): OPEN REDUCTION INTERNAL FIXATION (ORIF) RIGHT ANKLE FRACTURE (Right)  Patient Location: PACU  Anesthesia Type:GA combined with regional for post-op pain  Level of Consciousness: awake, alert  and oriented  Airway & Oxygen Therapy: Patient Spontanous Breathing and Patient connected to nasal cannula oxygen  Post-op Assessment: Report given to PACU RN, Post -op Vital signs reviewed and stable and Patient moving all extremities  Post vital signs: Reviewed and stable  Complications: No apparent anesthesia complications

## 2014-03-01 NOTE — Anesthesia Preprocedure Evaluation (Signed)
Anesthesia Evaluation  Patient identified by MRN, date of birth, ID band Patient awake    Reviewed: Allergy & Precautions, NPO status , Patient's Chart, lab work & pertinent test results  Airway Mallampati: II  TM Distance: >3 FB Neck ROM: Full    Dental  (+) Teeth Intact, Dental Advisory Given   Pulmonary  breath sounds clear to auscultation        Cardiovascular hypertension, Rhythm:Regular Rate:Normal     Neuro/Psych    GI/Hepatic   Endo/Other    Renal/GU      Musculoskeletal   Abdominal   Peds  Hematology   Anesthesia Other Findings   Reproductive/Obstetrics                             Anesthesia Physical Anesthesia Plan  ASA: II  Anesthesia Plan: General   Post-op Pain Management:    Induction: Intravenous  Airway Management Planned: LMA  Additional Equipment:   Intra-op Plan:   Post-operative Plan: Extubation in OR  Informed Consent: I have reviewed the patients History and Physical, chart, labs and discussed the procedure including the risks, benefits and alternatives for the proposed anesthesia with the patient or authorized representative who has indicated his/her understanding and acceptance.   Dental advisory given  Plan Discussed with: CRNA and Anesthesiologist  Anesthesia Plan Comments: (Htn H/O RSD now resolved  Plan GA with LMA with popliteal block  Kipp Broodavid Oleva Koo, MD)        Anesthesia Quick Evaluation

## 2014-03-01 NOTE — Op Note (Signed)
   Date of Surgery: 03/01/2014  INDICATIONS: Mr. Trevor Rollins is a 44 y.o.-year-old male who sustained a right ankle fracture; he was indicated for open reduction and internal fixation due to the displaced nature of the articular fracture and came to the operating room today for this procedure. The patient did consent to the procedure after discussion of the risks and benefits.  PREOPERATIVE DIAGNOSIS: right trimalleolar ankle fracture  POSTOPERATIVE DIAGNOSIS: Same.  PROCEDURE: Open treatment of right ankle fracture with internal fixation. Trimalleolar w/o fixation of posterior malleolus CPT 27822.  SURGEON: N. Glee ArvinMichael Xu, M.D.  ASSIST: Hart CarwinJustin Queen, RNFA.  ANESTHESIA:  general, regional  IV FLUIDS AND URINE: See anesthesia.  ESTIMATED BLOOD LOSS: minimal mL.  IMPLANTS: [employer] Variax 4 hole distal fibula plate  COMPLICATIONS: None.  DESCRIPTION OF PROCEDURE: The patient was brought to the operating room and placed supine on the operating table.  The patient had been signed prior to the procedure and this was documented. The patient had the anesthesia placed by the anesthesiologist.  A nonsterile tourniquet was placed on the upper thigh.  The prep verification and incision time-outs were performed to confirm that this was the correct patient, site, side and location. The patient had an SCD on the opposite lower extremity. The patient did receive antibiotics prior to the incision and was re-dosed during the procedure as needed at indicated intervals.  The patient had the lower extremity prepped and draped in the standard surgical fashion.  The extremity was exsanguinated using an esmarch bandage and the tourniquet was inflated to 300 mm Hg.  The bony landmarks were palpated and a lateral incision over the fibula was used. Full-thickness flaps were created and elevated off of the fibula. The superficial peroneal nerve was protected during the entirety of the procedure. Subperiosteal elevation was  carried out around the fracture site. Any organized hematoma and entrapped periosteum was retrieved from the fracture site. A reduction was affected and held in place with a tenaculum clamp. Of note the patient had poor bone quality and significant comminution of the fracture. We then sized a distal fibular plate to the lateral aspect of the fibula. We fixed the fracture and bridge fashion given the amount of comminution. X-rays were taken to confirm adequate plate placement and reduction of the fracture. We then turned our attention to the medial malleolus fracture. The medial malleolus fracture had self reduced after the fibula was reduced. This was confirmed on fluoroscopy. We placed 2 percutaneous 4.0 mm cannulated screws over parallel K wires. This was done under fluoroscopy. Compression was achieved with use of a partially-threaded screw. The posterior malleolus fracture was treated without internal fixation.  The wound was thoroughly irrigated and closed in layer fashion using 2-0 Vicryl and 3-0 nylon. Sterile dressings were applied. Short-leg splint was placed. Patient was x-rayed and transferred to the PACU in stable condition.  POSTOPERATIVE PLAN: Mr. Trevor Rollins will remain nonweightbearing on this leg for approximately 6 weeks; Mr. Trevor Rollins will return for suture removal in 2 weeks.  He will be immobilized in a short leg splint and then transitioned to a short leg cast at his first follow up appointment.  Mr. Trevor Rollins will receive DVT prophylaxis based on other medications, activity level, and risk ratio of bleeding to thrombosis.  Mayra ReelN. Michael Xu, MD [facility] [phone] 5:14 PM

## 2014-03-04 ENCOUNTER — Other Ambulatory Visit: Payer: Self-pay | Admitting: Nurse Practitioner

## 2014-03-06 ENCOUNTER — Encounter (HOSPITAL_COMMUNITY): Payer: Self-pay | Admitting: Orthopaedic Surgery

## 2014-03-15 ENCOUNTER — Encounter (HOSPITAL_COMMUNITY): Payer: Self-pay | Admitting: Orthopaedic Surgery

## 2014-03-24 ENCOUNTER — Other Ambulatory Visit: Payer: Self-pay | Admitting: Family Medicine

## 2014-03-27 ENCOUNTER — Other Ambulatory Visit: Payer: Self-pay | Admitting: Family Medicine

## 2014-04-02 ENCOUNTER — Ambulatory Visit: Payer: BC Managed Care – PPO | Admitting: Family Medicine

## 2014-04-03 ENCOUNTER — Ambulatory Visit (INDEPENDENT_AMBULATORY_CARE_PROVIDER_SITE_OTHER): Payer: BC Managed Care – PPO | Admitting: Family Medicine

## 2014-04-03 ENCOUNTER — Encounter: Payer: Self-pay | Admitting: Family Medicine

## 2014-04-03 VITALS — BP 132/82 | Ht 74.0 in

## 2014-04-03 DIAGNOSIS — M545 Low back pain, unspecified: Secondary | ICD-10-CM

## 2014-04-03 MED ORDER — TIZANIDINE HCL 4 MG PO TABS: 4.0000 mg | ORAL_TABLET | Freq: Four times a day (QID) | ORAL | Status: DC | PRN

## 2014-04-03 NOTE — Progress Notes (Signed)
   Subjective:    Patient ID: Trevor Rollins, male    DOB: 02/14/1970, 44 y.o.   MRN: [id number]  HPI Patient arrives with complaint of muscle cramps in legs foot and back-has been sleeping on couch due to broken leg and leg surgery.  Pt having sig spasm, leg and foot  And low back   lumb spasms prn and sig   spasms quite painful at times. Having this sleep on the couch.   Review of Systems  no headache no chest pain no back pain no abdominal pain    Objective:   Physical Exam   alert no acute distress. Using crutches. Vitals stable. Lungs clear heart regular in rhythm. Low back slight tenderness to palpation right ankle healing surgical wound no calf tenderness       Assessment & Plan:   impression muscle spasm plan Zanaflex when necessary. Local measures discussed. WSL

## 2014-05-12 ENCOUNTER — Other Ambulatory Visit: Payer: Self-pay | Admitting: Family Medicine

## 2014-07-16 ENCOUNTER — Telehealth: Payer: Self-pay | Admitting: Family Medicine

## 2014-07-16 ENCOUNTER — Other Ambulatory Visit: Payer: Self-pay | Admitting: *Deleted

## 2014-07-16 MED ORDER — CITALOPRAM HYDROBROMIDE 20 MG PO TABS: 10.0000 mg | ORAL_TABLET | Freq: Every day | ORAL | Status: DC

## 2014-07-16 NOTE — Telephone Encounter (Signed)
Patient needs Rx for citalopram to [facility].  He is completely out and needs this ASAP please.

## 2014-07-16 NOTE — Telephone Encounter (Signed)
Ok, chronic o v next month

## 2014-07-16 NOTE — Telephone Encounter (Signed)
Last seen 2/23 for back pain. Med check up over 1 year ago. Refill done in feb was told needed office visit. Requesting 90 day supply.

## 2014-07-16 NOTE — Telephone Encounter (Signed)
Med sent to pharm. Pt notified on voicemail.  

## 2014-08-14 DIAGNOSIS — Z029 Encounter for administrative examinations, unspecified: Secondary | ICD-10-CM

## 2014-08-21 ENCOUNTER — Ambulatory Visit (INDEPENDENT_AMBULATORY_CARE_PROVIDER_SITE_OTHER): Payer: BC Managed Care – PPO | Admitting: Family Medicine

## 2014-08-21 ENCOUNTER — Encounter: Payer: Self-pay | Admitting: Family Medicine

## 2014-08-21 VITALS — BP 140/82 | Temp 98.3°F | Ht 74.0 in | Wt 247.2 lb

## 2014-08-21 DIAGNOSIS — R252 Cramp and spasm: Secondary | ICD-10-CM

## 2014-08-21 DIAGNOSIS — I10 Essential (primary) hypertension: Secondary | ICD-10-CM

## 2014-08-21 DIAGNOSIS — R05 Cough: Secondary | ICD-10-CM

## 2014-08-21 DIAGNOSIS — J301 Allergic rhinitis due to pollen: Secondary | ICD-10-CM | POA: Diagnosis not present

## 2014-08-21 DIAGNOSIS — R059 Cough, unspecified: Secondary | ICD-10-CM

## 2014-08-21 MED ORDER — METHYLPREDNISOLONE ACETATE 40 MG/ML IJ SUSP
40.0000 mg | Freq: Once | INTRAMUSCULAR | Status: AC
Start: 2014-08-21 — End: 2014-08-21
  Administered 2014-08-21: 40 mg via INTRAMUSCULAR

## 2014-08-21 MED ORDER — CITALOPRAM HYDROBROMIDE 20 MG PO TABS: 10.0000 mg | ORAL_TABLET | Freq: Every day | ORAL | Status: DC

## 2014-08-21 MED ORDER — FLUTICASONE PROPIONATE 50 MCG/ACT NA SUSP: 2.0000 | Freq: Every day | NASAL | Status: DC

## 2014-08-21 MED ORDER — ENALAPRIL MALEATE 10 MG PO TABS: 10.0000 mg | ORAL_TABLET | Freq: Every day | ORAL | Status: DC

## 2014-08-21 MED ORDER — DIAZEPAM 5 MG PO TABS: 5.0000 mg | ORAL_TABLET | Freq: Every evening | ORAL | Status: DC | PRN

## 2014-08-21 NOTE — Progress Notes (Signed)
   Subjective:    Patient ID: Trevor Rollins, male    DOB: 02/04/1971, 44 y.o.   MRN: [id number]  Sinusitis This is a new problem. The current episode started 1 to 4 weeks ago. The problem is unchanged. There has been no fever. The pain is moderate. Associated symptoms include congestion and coughing. Treatments tried: loratadine  The treatment provided no relief.   recently did a lot of [place] hogging which seemed to flareup allergies Patient states that he is having muscle cramps. He would like to discuss this with the doctor also. Uses Valium daily at bedtime when necessary. Helps the cramps considerably.  Compliant blood pressure medicine no obvious side effects per watching salt intake. Does not miss a dose.  Compliant with an eye depressed. Feels it's definitely still help him would like to stay on the medicine.  Going to [place], walking regulaly  Recently had fx in the leg, at times muscles cramp up intermittently  Hx of valium qhs as needed for muscles cramps  Review of Systems  HENT: Positive for congestion.   Respiratory: Positive for cough.        Objective:   Physical Exam  Alert vitals stable blood pressure good on repeat HEENT mild nasal congestion. Neck supple. Lungs clear. Heart regular in rhythm ankles edema      Assessment & Plan:  Impression 1 substantial flare of allergic rhinitis #2 hypertension good control #3 depression clinically stable number for nocturnal cramps plan thallium refilled. Use sparingly. Diet exercise discussed. Other medications refilled. Depo-Medrol injection because that helps his allergies to most follow-up every 6 months WSL

## 2014-08-23 ENCOUNTER — Other Ambulatory Visit: Payer: Self-pay | Admitting: Family Medicine

## 2014-10-10 ENCOUNTER — Other Ambulatory Visit: Payer: Self-pay | Admitting: Family Medicine

## 2014-11-01 DIAGNOSIS — J309 Allergic rhinitis, unspecified: Principal | ICD-10-CM

## 2014-11-01 DIAGNOSIS — H1010 Acute atopic conjunctivitis, unspecified eye: Secondary | ICD-10-CM | POA: Insufficient documentation

## 2014-11-06 ENCOUNTER — Ambulatory Visit (INDEPENDENT_AMBULATORY_CARE_PROVIDER_SITE_OTHER): Payer: BC Managed Care – PPO

## 2014-11-06 DIAGNOSIS — J309 Allergic rhinitis, unspecified: Secondary | ICD-10-CM

## 2014-11-20 ENCOUNTER — Ambulatory Visit (INDEPENDENT_AMBULATORY_CARE_PROVIDER_SITE_OTHER): Payer: BC Managed Care – PPO

## 2014-11-20 DIAGNOSIS — J309 Allergic rhinitis, unspecified: Secondary | ICD-10-CM

## 2014-11-21 DIAGNOSIS — J301 Allergic rhinitis due to pollen: Secondary | ICD-10-CM | POA: Diagnosis not present

## 2014-11-22 DIAGNOSIS — J3089 Other allergic rhinitis: Secondary | ICD-10-CM | POA: Diagnosis not present

## 2014-11-27 ENCOUNTER — Ambulatory Visit (INDEPENDENT_AMBULATORY_CARE_PROVIDER_SITE_OTHER): Payer: BC Managed Care – PPO

## 2014-11-27 DIAGNOSIS — J301 Allergic rhinitis due to pollen: Secondary | ICD-10-CM | POA: Diagnosis not present

## 2014-12-04 ENCOUNTER — Ambulatory Visit (INDEPENDENT_AMBULATORY_CARE_PROVIDER_SITE_OTHER): Payer: BC Managed Care – PPO

## 2014-12-04 DIAGNOSIS — J301 Allergic rhinitis due to pollen: Secondary | ICD-10-CM

## 2014-12-12 ENCOUNTER — Ambulatory Visit (INDEPENDENT_AMBULATORY_CARE_PROVIDER_SITE_OTHER): Payer: BC Managed Care – PPO | Admitting: Neurology

## 2014-12-12 DIAGNOSIS — J309 Allergic rhinitis, unspecified: Secondary | ICD-10-CM | POA: Diagnosis not present

## 2014-12-28 ENCOUNTER — Ambulatory Visit (INDEPENDENT_AMBULATORY_CARE_PROVIDER_SITE_OTHER): Payer: BC Managed Care – PPO

## 2014-12-28 DIAGNOSIS — J309 Allergic rhinitis, unspecified: Secondary | ICD-10-CM | POA: Diagnosis not present

## 2015-01-01 ENCOUNTER — Ambulatory Visit (INDEPENDENT_AMBULATORY_CARE_PROVIDER_SITE_OTHER): Payer: BC Managed Care – PPO

## 2015-01-01 DIAGNOSIS — J309 Allergic rhinitis, unspecified: Secondary | ICD-10-CM | POA: Diagnosis not present

## 2015-01-08 ENCOUNTER — Ambulatory Visit (INDEPENDENT_AMBULATORY_CARE_PROVIDER_SITE_OTHER): Payer: BC Managed Care – PPO

## 2015-01-08 DIAGNOSIS — J309 Allergic rhinitis, unspecified: Secondary | ICD-10-CM

## 2015-01-10 ENCOUNTER — Other Ambulatory Visit: Payer: Self-pay | Admitting: Family Medicine

## 2015-01-15 ENCOUNTER — Ambulatory Visit (INDEPENDENT_AMBULATORY_CARE_PROVIDER_SITE_OTHER): Payer: BC Managed Care – PPO

## 2015-01-15 DIAGNOSIS — J309 Allergic rhinitis, unspecified: Secondary | ICD-10-CM | POA: Diagnosis not present

## 2015-02-05 ENCOUNTER — Ambulatory Visit (INDEPENDENT_AMBULATORY_CARE_PROVIDER_SITE_OTHER): Payer: BC Managed Care – PPO

## 2015-02-05 DIAGNOSIS — J309 Allergic rhinitis, unspecified: Secondary | ICD-10-CM

## 2015-02-12 ENCOUNTER — Ambulatory Visit (INDEPENDENT_AMBULATORY_CARE_PROVIDER_SITE_OTHER): Payer: BC Managed Care – PPO

## 2015-02-12 DIAGNOSIS — J309 Allergic rhinitis, unspecified: Secondary | ICD-10-CM | POA: Diagnosis not present

## 2015-02-13 ENCOUNTER — Other Ambulatory Visit: Payer: Self-pay | Admitting: Family Medicine

## 2015-02-19 ENCOUNTER — Ambulatory Visit (INDEPENDENT_AMBULATORY_CARE_PROVIDER_SITE_OTHER): Payer: BC Managed Care – PPO | Admitting: Neurology

## 2015-02-19 DIAGNOSIS — J309 Allergic rhinitis, unspecified: Secondary | ICD-10-CM | POA: Diagnosis not present

## 2015-02-28 ENCOUNTER — Other Ambulatory Visit: Payer: Self-pay | Admitting: Family Medicine

## 2015-03-05 ENCOUNTER — Ambulatory Visit (INDEPENDENT_AMBULATORY_CARE_PROVIDER_SITE_OTHER): Payer: BC Managed Care – PPO

## 2015-03-05 DIAGNOSIS — J309 Allergic rhinitis, unspecified: Secondary | ICD-10-CM

## 2015-03-12 ENCOUNTER — Ambulatory Visit (INDEPENDENT_AMBULATORY_CARE_PROVIDER_SITE_OTHER): Payer: BC Managed Care – PPO

## 2015-03-12 DIAGNOSIS — J309 Allergic rhinitis, unspecified: Secondary | ICD-10-CM

## 2015-03-18 ENCOUNTER — Other Ambulatory Visit: Payer: Self-pay | Admitting: Family Medicine

## 2015-03-21 DIAGNOSIS — J3089 Other allergic rhinitis: Secondary | ICD-10-CM | POA: Diagnosis not present

## 2015-03-22 DIAGNOSIS — J301 Allergic rhinitis due to pollen: Secondary | ICD-10-CM | POA: Diagnosis not present

## 2015-03-26 ENCOUNTER — Ambulatory Visit (INDEPENDENT_AMBULATORY_CARE_PROVIDER_SITE_OTHER): Payer: BC Managed Care – PPO

## 2015-03-26 DIAGNOSIS — J309 Allergic rhinitis, unspecified: Secondary | ICD-10-CM

## 2015-04-02 ENCOUNTER — Ambulatory Visit (INDEPENDENT_AMBULATORY_CARE_PROVIDER_SITE_OTHER): Payer: BC Managed Care – PPO

## 2015-04-02 DIAGNOSIS — J309 Allergic rhinitis, unspecified: Secondary | ICD-10-CM | POA: Diagnosis not present

## 2015-04-09 ENCOUNTER — Ambulatory Visit (INDEPENDENT_AMBULATORY_CARE_PROVIDER_SITE_OTHER): Payer: BC Managed Care – PPO

## 2015-04-09 DIAGNOSIS — J309 Allergic rhinitis, unspecified: Secondary | ICD-10-CM | POA: Diagnosis not present

## 2015-04-16 ENCOUNTER — Other Ambulatory Visit: Payer: Self-pay | Admitting: Family Medicine

## 2015-04-16 ENCOUNTER — Ambulatory Visit (INDEPENDENT_AMBULATORY_CARE_PROVIDER_SITE_OTHER): Payer: BC Managed Care – PPO

## 2015-04-16 DIAGNOSIS — J309 Allergic rhinitis, unspecified: Secondary | ICD-10-CM

## 2015-04-16 NOTE — Telephone Encounter (Signed)
Patient called hoping this can be done today because he is having trouble sleeping because he has been doing spring work.

## 2015-04-23 ENCOUNTER — Ambulatory Visit (INDEPENDENT_AMBULATORY_CARE_PROVIDER_SITE_OTHER): Payer: BC Managed Care – PPO

## 2015-04-23 DIAGNOSIS — J309 Allergic rhinitis, unspecified: Secondary | ICD-10-CM | POA: Diagnosis not present

## 2015-05-07 ENCOUNTER — Ambulatory Visit (INDEPENDENT_AMBULATORY_CARE_PROVIDER_SITE_OTHER): Payer: BC Managed Care – PPO

## 2015-05-07 DIAGNOSIS — J309 Allergic rhinitis, unspecified: Secondary | ICD-10-CM

## 2015-05-13 ENCOUNTER — Ambulatory Visit: Payer: BC Managed Care – PPO | Admitting: Family Medicine

## 2015-05-14 ENCOUNTER — Ambulatory Visit (INDEPENDENT_AMBULATORY_CARE_PROVIDER_SITE_OTHER): Payer: BC Managed Care – PPO

## 2015-05-14 DIAGNOSIS — J309 Allergic rhinitis, unspecified: Secondary | ICD-10-CM | POA: Diagnosis not present

## 2015-05-20 ENCOUNTER — Encounter: Payer: Self-pay | Admitting: Family Medicine

## 2015-05-20 ENCOUNTER — Ambulatory Visit (INDEPENDENT_AMBULATORY_CARE_PROVIDER_SITE_OTHER): Payer: BC Managed Care – PPO | Admitting: Family Medicine

## 2015-05-20 VITALS — BP 136/82 | Ht 73.5 in | Wt 245.0 lb

## 2015-05-20 DIAGNOSIS — J301 Allergic rhinitis due to pollen: Secondary | ICD-10-CM

## 2015-05-20 DIAGNOSIS — Z91048 Other nonmedicinal substance allergy status: Secondary | ICD-10-CM

## 2015-05-20 DIAGNOSIS — G9050 Complex regional pain syndrome I, unspecified: Secondary | ICD-10-CM

## 2015-05-20 DIAGNOSIS — I10 Essential (primary) hypertension: Secondary | ICD-10-CM | POA: Diagnosis not present

## 2015-05-20 DIAGNOSIS — Z9109 Other allergy status, other than to drugs and biological substances: Secondary | ICD-10-CM

## 2015-05-20 DIAGNOSIS — M545 Low back pain, unspecified: Secondary | ICD-10-CM

## 2015-05-20 MED ORDER — ENALAPRIL MALEATE 10 MG PO TABS: 10.0000 mg | ORAL_TABLET | Freq: Every day | ORAL | Status: DC

## 2015-05-20 MED ORDER — METHYLPREDNISOLONE ACETATE 40 MG/ML IJ SUSP: 80.0000 mg | Freq: Once | INTRAMUSCULAR | Status: DC

## 2015-05-20 MED ORDER — TIZANIDINE HCL 4 MG PO TABS: ORAL_TABLET | ORAL | Status: DC

## 2015-05-20 MED ORDER — METHYLPREDNISOLONE ACETATE 80 MG/ML IJ SUSP
80.0000 mg | Freq: Once | INTRAMUSCULAR | Status: AC
  Administered 2015-05-20: 80 mg via INTRAMUSCULAR

## 2015-05-20 MED ORDER — FLUTICASONE PROPIONATE 50 MCG/ACT NA SUSP: 2.0000 | Freq: Every day | NASAL | Status: DC

## 2015-05-20 NOTE — Progress Notes (Signed)
   Subjective:    Patient ID: Trevor Rollins, male    DOB: 05/24/1970, 45 y.o.   MRN: [id number]  Hypertension This is a chronic problem. The current episode started more than 1 year ago. Treatments tried: enalapril. Compliance problems include exercise (walks a mile a day).    Requesting 80 mg depo medrol for allergies, back to walking and now a lot of allergy symptoms. . Wants to start back on flonase.   Wants refill on tizanidine,would like to go to 60 per rx no obvious side effects. Definitely helps his muscle spasm pain.  flonase, and definitely help  Enalapril., did not exdrcise this winter, sticking with meds, cutting down salt intake   allergy shots has cut down flare up considerably   Pt states he use to get #60 of tizanidine and then it went to #30. Pt prefers #60 because some days he has to take more than one. , sometimes  Not using celexa, notes not on these meds now and hndling well. Patient feels he needs nothing along these lines. Reports no problem with depression or anxiety this time is not taking the medication.  Allergies continued be a major problem. Uses Flonase regularly. Uses Claritin also. Overall helped symptoms. Although times flareup unfortunately salmon a pretty massive flare right now  Review of Systems No headache, no major weight loss or weight gain, no chest pain no back pain abdominal pain no change in bowel habits complete ROS otherwise negative     Objective:   Physical Exam  Alert vital stable HET moderate nasal congestion eyes injected slight nasal discharge pharynx normal neck supple lungs clear heart regular in rhythm blood pressure excellent on repeat      Assessment & Plan:  Impression 1 hypertension good control discussed maintain same meds #2 allergic rhinitis major flare patient request steroids which is reasonable #3 chronic muscle pain with element of spasm. Angles wall medications. #4 depression/anxiety clinically resolved at this time  plan diet exercise discussed., Medications refilled multiple questions answered WSL

## 2015-05-25 ENCOUNTER — Encounter: Payer: Self-pay | Admitting: Family Medicine

## 2015-05-28 ENCOUNTER — Ambulatory Visit (INDEPENDENT_AMBULATORY_CARE_PROVIDER_SITE_OTHER): Payer: BC Managed Care – PPO

## 2015-05-28 DIAGNOSIS — J309 Allergic rhinitis, unspecified: Secondary | ICD-10-CM

## 2015-06-25 ENCOUNTER — Ambulatory Visit (INDEPENDENT_AMBULATORY_CARE_PROVIDER_SITE_OTHER): Payer: BC Managed Care – PPO | Admitting: *Deleted

## 2015-06-25 DIAGNOSIS — J309 Allergic rhinitis, unspecified: Secondary | ICD-10-CM | POA: Diagnosis not present

## 2015-06-26 DIAGNOSIS — J3089 Other allergic rhinitis: Secondary | ICD-10-CM | POA: Diagnosis not present

## 2015-06-27 DIAGNOSIS — J301 Allergic rhinitis due to pollen: Secondary | ICD-10-CM | POA: Diagnosis not present

## 2015-07-16 ENCOUNTER — Ambulatory Visit (INDEPENDENT_AMBULATORY_CARE_PROVIDER_SITE_OTHER): Payer: BC Managed Care – PPO

## 2015-07-16 DIAGNOSIS — J309 Allergic rhinitis, unspecified: Secondary | ICD-10-CM

## 2015-07-26 ENCOUNTER — Ambulatory Visit (INDEPENDENT_AMBULATORY_CARE_PROVIDER_SITE_OTHER): Payer: BC Managed Care – PPO | Admitting: *Deleted

## 2015-07-26 DIAGNOSIS — J309 Allergic rhinitis, unspecified: Secondary | ICD-10-CM | POA: Diagnosis not present

## 2015-08-14 ENCOUNTER — Ambulatory Visit (INDEPENDENT_AMBULATORY_CARE_PROVIDER_SITE_OTHER): Payer: BC Managed Care – PPO

## 2015-08-14 DIAGNOSIS — J309 Allergic rhinitis, unspecified: Secondary | ICD-10-CM

## 2015-09-12 ENCOUNTER — Ambulatory Visit (INDEPENDENT_AMBULATORY_CARE_PROVIDER_SITE_OTHER): Payer: BC Managed Care – PPO

## 2015-09-12 DIAGNOSIS — J309 Allergic rhinitis, unspecified: Secondary | ICD-10-CM | POA: Diagnosis not present

## 2015-09-18 ENCOUNTER — Ambulatory Visit (INDEPENDENT_AMBULATORY_CARE_PROVIDER_SITE_OTHER): Payer: BC Managed Care – PPO

## 2015-09-18 DIAGNOSIS — J309 Allergic rhinitis, unspecified: Secondary | ICD-10-CM | POA: Diagnosis not present

## 2015-10-08 ENCOUNTER — Ambulatory Visit (INDEPENDENT_AMBULATORY_CARE_PROVIDER_SITE_OTHER): Payer: BC Managed Care – PPO

## 2015-10-08 DIAGNOSIS — J309 Allergic rhinitis, unspecified: Secondary | ICD-10-CM

## 2015-10-18 ENCOUNTER — Other Ambulatory Visit: Payer: Self-pay | Admitting: Family Medicine

## 2015-10-18 NOTE — Telephone Encounter (Signed)
Refusal of this renewal, according to the records patient is not on this medicine, patient needs to call, cancel or refuse this refill request thank you

## 2015-10-29 ENCOUNTER — Ambulatory Visit (INDEPENDENT_AMBULATORY_CARE_PROVIDER_SITE_OTHER): Payer: BC Managed Care – PPO | Admitting: *Deleted

## 2015-10-29 DIAGNOSIS — J309 Allergic rhinitis, unspecified: Secondary | ICD-10-CM | POA: Diagnosis not present

## 2015-11-05 ENCOUNTER — Ambulatory Visit (INDEPENDENT_AMBULATORY_CARE_PROVIDER_SITE_OTHER): Payer: BC Managed Care – PPO | Admitting: *Deleted

## 2015-11-05 DIAGNOSIS — J309 Allergic rhinitis, unspecified: Secondary | ICD-10-CM

## 2015-11-12 ENCOUNTER — Ambulatory Visit (INDEPENDENT_AMBULATORY_CARE_PROVIDER_SITE_OTHER): Payer: BC Managed Care – PPO | Admitting: *Deleted

## 2015-11-12 DIAGNOSIS — J309 Allergic rhinitis, unspecified: Secondary | ICD-10-CM | POA: Diagnosis not present

## 2015-11-19 ENCOUNTER — Ambulatory Visit (INDEPENDENT_AMBULATORY_CARE_PROVIDER_SITE_OTHER): Payer: BC Managed Care – PPO | Admitting: *Deleted

## 2015-11-19 DIAGNOSIS — J309 Allergic rhinitis, unspecified: Secondary | ICD-10-CM | POA: Diagnosis not present

## 2015-11-25 DIAGNOSIS — J301 Allergic rhinitis due to pollen: Secondary | ICD-10-CM | POA: Diagnosis not present

## 2015-11-26 ENCOUNTER — Ambulatory Visit (INDEPENDENT_AMBULATORY_CARE_PROVIDER_SITE_OTHER): Payer: BC Managed Care – PPO | Admitting: *Deleted

## 2015-11-26 DIAGNOSIS — J309 Allergic rhinitis, unspecified: Secondary | ICD-10-CM

## 2015-11-27 DIAGNOSIS — J3089 Other allergic rhinitis: Secondary | ICD-10-CM | POA: Diagnosis not present

## 2015-12-24 ENCOUNTER — Other Ambulatory Visit: Payer: Self-pay | Admitting: Family Medicine

## 2015-12-31 ENCOUNTER — Ambulatory Visit (INDEPENDENT_AMBULATORY_CARE_PROVIDER_SITE_OTHER): Payer: BC Managed Care – PPO | Admitting: *Deleted

## 2015-12-31 DIAGNOSIS — J309 Allergic rhinitis, unspecified: Secondary | ICD-10-CM | POA: Diagnosis not present

## 2016-01-07 ENCOUNTER — Encounter: Payer: Self-pay | Admitting: Family Medicine

## 2016-01-07 ENCOUNTER — Ambulatory Visit (INDEPENDENT_AMBULATORY_CARE_PROVIDER_SITE_OTHER): Payer: BC Managed Care – PPO | Admitting: *Deleted

## 2016-01-07 ENCOUNTER — Ambulatory Visit (INDEPENDENT_AMBULATORY_CARE_PROVIDER_SITE_OTHER): Payer: BC Managed Care – PPO | Admitting: Family Medicine

## 2016-01-07 VITALS — BP 132/78 | Ht 74.0 in | Wt 238.0 lb

## 2016-01-07 DIAGNOSIS — E785 Hyperlipidemia, unspecified: Secondary | ICD-10-CM | POA: Diagnosis not present

## 2016-01-07 DIAGNOSIS — M791 Myalgia, unspecified site: Secondary | ICD-10-CM

## 2016-01-07 DIAGNOSIS — Z79899 Other long term (current) drug therapy: Secondary | ICD-10-CM

## 2016-01-07 DIAGNOSIS — J309 Allergic rhinitis, unspecified: Secondary | ICD-10-CM | POA: Diagnosis not present

## 2016-01-07 DIAGNOSIS — Z13220 Encounter for screening for lipoid disorders: Secondary | ICD-10-CM

## 2016-01-07 DIAGNOSIS — I10 Essential (primary) hypertension: Secondary | ICD-10-CM

## 2016-01-07 MED ORDER — TIZANIDINE HCL 4 MG PO TABS: ORAL_TABLET | ORAL | 5 refills | Status: DC

## 2016-01-07 MED ORDER — ENALAPRIL MALEATE 10 MG PO TABS: 10.0000 mg | ORAL_TABLET | Freq: Every day | ORAL | 5 refills | Status: DC

## 2016-01-07 MED ORDER — DIAZEPAM 5 MG PO TABS: ORAL_TABLET | ORAL | 0 refills | Status: DC

## 2016-01-07 NOTE — Progress Notes (Signed)
   Subjective:    Patient ID: Trevor Rollins, male    DOB: 08/11/1970, 45 y.o.   MRN: [id number] Patient arrives office with several concerns HPIMuscle spasms all over. Started 30 years ago. Needs refills on zanaflex. elps gernally with ths spasm states definitely needs it.   needs valium prn . Patient uses when necessary for insomnia. Also when necessary for severe muscle spasms when the Zanaflex does not help  Patient also still gets substantial difficulties with allergies. Uses a Flonase faithfully. Overall controlling symptoms well.  Flu shot already given    Results for orders placed or performed during the hospital encounter of 03/01/14  Surgical pcr screen  Result Value Ref Range   MRSA, PCR NEGATIVE NEGATIVE   Staphylococcus aureus NEGATIVE NEGATIVE  CBC  Result Value Ref Range   WBC 10.0 4.0 - 10.5 K/uL   RBC 4.68 4.22 - 5.81 MIL/uL   Hemoglobin 14.7 13.0 - 17.0 g/dL   HCT 40.944.3 81.139.0 - 91.452.0 %   MCV 94.7 78.0 - 100.0 fL   MCH 31.4 26.0 - 34.0 pg   MCHC 33.2 30.0 - 36.0 g/dL   RDW 78.212.9 95.611.5 - 21.315.5 %   Platelets 261 150 - 400 K/uL  Basic metabolic panel  Result Value Ref Range   Sodium 136 135 - 145 mmol/L   Potassium 4.5 3.5 - 5.1 mmol/L   Chloride 101 96 - 112 mEq/L   CO2 25 19 - 32 mmol/L   Glucose, Bld 106 (H) 70 - 99 mg/dL   BUN 11 6 - 23 mg/dL   Creatinine, Ser 0.861.02 0.50 - 1.35 mg/dL   Calcium 9.5 8.4 - 57.810.5 mg/dL   GFR calc non Af Amer 88 (L) >90 mL/min   GFR calc Af Amer >90 >90 mL/min   Anion gap 10 5 - 15   Blood pressure medicine and blood pressure levels reviewed today with patient. Compliant with blood pressure medicine. States does not miss a dose. No obvious side effects. Blood pressure generally good when checked elsewhere. Watching salt intake.  BP geernally good elsewhere Blood pressure medicine and blood pressure levels reviewed today with patient. Compliant with blood pressure medicine. States does not miss a dose. No obvious side effects. Blood  pressure generally good when checked elsewhere. Watching salt intake.    Pt also wanted to get med check up while here today.    Review of Systems No headache, no major weight loss or weight gain, no chest pain no back pain abdominal pain no change in bowel habits complete ROS otherwise negative     Objective:   Physical Exam  Alert vitals stable, NAD. Blood pressure good on repeat. HEENTMild nasal congestion otherwise normal. Lungs clear. Heart regular rate and rhythm.       Assessment & Plan:  Impression hypertension good control discussed maintain same meds #2 chronic rhinitis. Generally Flonase and Claritin maintains reasonable control said to maintain #3 skeletal muscle spasms patient has substantial problems with this. Will screen for magnesium along with usual blood work. Zanaflex refilled. Diet and to use for more severe spasms all medications refilled. Blood work performed further recommendations based results

## 2016-02-11 ENCOUNTER — Ambulatory Visit (INDEPENDENT_AMBULATORY_CARE_PROVIDER_SITE_OTHER): Payer: BC Managed Care – PPO | Admitting: *Deleted

## 2016-02-11 DIAGNOSIS — J309 Allergic rhinitis, unspecified: Secondary | ICD-10-CM | POA: Diagnosis not present

## 2016-02-21 ENCOUNTER — Other Ambulatory Visit: Payer: Self-pay | Admitting: Family Medicine

## 2016-02-22 NOTE — Telephone Encounter (Signed)
May have one refill on each with 2 additional refills will need office visit before further

## 2016-03-03 ENCOUNTER — Ambulatory Visit (INDEPENDENT_AMBULATORY_CARE_PROVIDER_SITE_OTHER): Payer: BC Managed Care – PPO | Admitting: *Deleted

## 2016-03-03 DIAGNOSIS — J309 Allergic rhinitis, unspecified: Secondary | ICD-10-CM | POA: Diagnosis not present

## 2016-03-05 ENCOUNTER — Telehealth: Payer: Self-pay | Admitting: Family Medicine

## 2016-03-05 NOTE — Telephone Encounter (Signed)
Patient refused to tell me anything and will only talk to Dr. Brett CanalesSteve. He said that he will pay for the phone call if need be. Offered patient an office visit and he refused. Told patient that Dr. Brett CanalesSteve will not be able to call him back due to seeing patients with many urgent concerns and he stated ok and hung up.

## 2016-03-05 NOTE — Telephone Encounter (Signed)
Noted and proper response, pts have to work with us, gen erally if complicated enough for demand for consversation over phone requires an o v to cover adequately

## 2016-03-05 NOTE — Telephone Encounter (Signed)
Patient called and would not give any information to me.  He just says it is urgent and he needs Dr. Brett CanalesSteve to call him.

## 2016-03-05 NOTE — Telephone Encounter (Signed)
Called pt and explained to him that dr Brett Canalessteve is seeing pts and cannot call him. Pt states he will only speak to dr Brett Canalessteve. Offered to make him an appt to talk face to face. Pt declined. Pt states he wants dr Brett Canalessteve to call him. Does not want appt. Explained to pt that I would send message but cannot guarantee that dr Brett Canalessteve will be able to call him and if he has something urgent going on he needs to tell me so I can talk with the doctor. Pt still declined.

## 2016-03-05 NOTE — Telephone Encounter (Signed)
Sorry nurse will need to interact with him so I can no severity we have many patients with many urgen problems

## 2016-03-10 ENCOUNTER — Ambulatory Visit (INDEPENDENT_AMBULATORY_CARE_PROVIDER_SITE_OTHER): Payer: BC Managed Care – PPO | Admitting: *Deleted

## 2016-03-10 DIAGNOSIS — J309 Allergic rhinitis, unspecified: Secondary | ICD-10-CM | POA: Diagnosis not present

## 2016-03-16 NOTE — Addendum Note (Signed)
Addended by: Berna BueWHITAKER, Maan Zarcone L on: 03/16/2016 02:37 PM   Modules accepted: Orders

## 2016-03-17 ENCOUNTER — Ambulatory Visit (INDEPENDENT_AMBULATORY_CARE_PROVIDER_SITE_OTHER): Payer: BC Managed Care – PPO | Admitting: *Deleted

## 2016-03-17 DIAGNOSIS — J309 Allergic rhinitis, unspecified: Secondary | ICD-10-CM

## 2016-03-17 MED ORDER — EPINEPHRINE 0.3 MG/0.3ML IJ SOAJ: 0.3000 mg | Freq: Once | INTRAMUSCULAR | 1 refills | Status: AC

## 2016-04-07 ENCOUNTER — Ambulatory Visit (INDEPENDENT_AMBULATORY_CARE_PROVIDER_SITE_OTHER): Payer: BC Managed Care – PPO | Admitting: *Deleted

## 2016-04-07 DIAGNOSIS — J309 Allergic rhinitis, unspecified: Secondary | ICD-10-CM | POA: Diagnosis not present

## 2016-04-15 ENCOUNTER — Ambulatory Visit (INDEPENDENT_AMBULATORY_CARE_PROVIDER_SITE_OTHER): Payer: BC Managed Care – PPO | Admitting: *Deleted

## 2016-04-15 DIAGNOSIS — J309 Allergic rhinitis, unspecified: Secondary | ICD-10-CM | POA: Diagnosis not present

## 2016-04-23 ENCOUNTER — Ambulatory Visit (INDEPENDENT_AMBULATORY_CARE_PROVIDER_SITE_OTHER): Payer: BC Managed Care – PPO | Admitting: *Deleted

## 2016-04-23 DIAGNOSIS — J309 Allergic rhinitis, unspecified: Secondary | ICD-10-CM | POA: Diagnosis not present

## 2016-04-30 ENCOUNTER — Ambulatory Visit (INDEPENDENT_AMBULATORY_CARE_PROVIDER_SITE_OTHER): Payer: BC Managed Care – PPO | Admitting: *Deleted

## 2016-04-30 DIAGNOSIS — J309 Allergic rhinitis, unspecified: Secondary | ICD-10-CM | POA: Diagnosis not present

## 2016-05-13 ENCOUNTER — Ambulatory Visit (INDEPENDENT_AMBULATORY_CARE_PROVIDER_SITE_OTHER): Payer: BC Managed Care – PPO | Admitting: *Deleted

## 2016-05-13 DIAGNOSIS — J309 Allergic rhinitis, unspecified: Secondary | ICD-10-CM

## 2016-06-01 ENCOUNTER — Ambulatory Visit (INDEPENDENT_AMBULATORY_CARE_PROVIDER_SITE_OTHER): Payer: BC Managed Care – PPO | Admitting: *Deleted

## 2016-06-01 DIAGNOSIS — J309 Allergic rhinitis, unspecified: Secondary | ICD-10-CM

## 2016-06-11 ENCOUNTER — Ambulatory Visit (INDEPENDENT_AMBULATORY_CARE_PROVIDER_SITE_OTHER): Payer: BC Managed Care – PPO | Admitting: *Deleted

## 2016-06-11 DIAGNOSIS — J309 Allergic rhinitis, unspecified: Secondary | ICD-10-CM | POA: Diagnosis not present

## 2016-06-17 ENCOUNTER — Encounter: Payer: Self-pay | Admitting: *Deleted

## 2016-06-19 DIAGNOSIS — J3089 Other allergic rhinitis: Secondary | ICD-10-CM | POA: Diagnosis not present

## 2016-06-23 ENCOUNTER — Ambulatory Visit (INDEPENDENT_AMBULATORY_CARE_PROVIDER_SITE_OTHER): Payer: BC Managed Care – PPO

## 2016-06-23 DIAGNOSIS — J309 Allergic rhinitis, unspecified: Secondary | ICD-10-CM | POA: Diagnosis not present

## 2016-07-10 ENCOUNTER — Ambulatory Visit (INDEPENDENT_AMBULATORY_CARE_PROVIDER_SITE_OTHER): Payer: BC Managed Care – PPO

## 2016-07-10 DIAGNOSIS — J309 Allergic rhinitis, unspecified: Secondary | ICD-10-CM

## 2016-08-18 ENCOUNTER — Ambulatory Visit (INDEPENDENT_AMBULATORY_CARE_PROVIDER_SITE_OTHER): Payer: BC Managed Care – PPO | Admitting: *Deleted

## 2016-08-18 DIAGNOSIS — J309 Allergic rhinitis, unspecified: Secondary | ICD-10-CM

## 2016-10-02 ENCOUNTER — Ambulatory Visit (INDEPENDENT_AMBULATORY_CARE_PROVIDER_SITE_OTHER): Payer: BC Managed Care – PPO

## 2016-10-02 DIAGNOSIS — J309 Allergic rhinitis, unspecified: Secondary | ICD-10-CM | POA: Diagnosis not present

## 2016-10-06 ENCOUNTER — Ambulatory Visit (INDEPENDENT_AMBULATORY_CARE_PROVIDER_SITE_OTHER): Payer: BC Managed Care – PPO | Admitting: *Deleted

## 2016-10-06 DIAGNOSIS — J309 Allergic rhinitis, unspecified: Secondary | ICD-10-CM | POA: Diagnosis not present

## 2016-10-09 ENCOUNTER — Other Ambulatory Visit: Payer: Self-pay | Admitting: Family Medicine

## 2016-10-20 ENCOUNTER — Ambulatory Visit (INDEPENDENT_AMBULATORY_CARE_PROVIDER_SITE_OTHER): Payer: BC Managed Care – PPO | Admitting: *Deleted

## 2016-10-20 DIAGNOSIS — J309 Allergic rhinitis, unspecified: Secondary | ICD-10-CM

## 2016-10-27 ENCOUNTER — Ambulatory Visit (INDEPENDENT_AMBULATORY_CARE_PROVIDER_SITE_OTHER): Payer: BC Managed Care – PPO | Admitting: *Deleted

## 2016-10-27 DIAGNOSIS — J309 Allergic rhinitis, unspecified: Secondary | ICD-10-CM

## 2016-11-03 ENCOUNTER — Ambulatory Visit (INDEPENDENT_AMBULATORY_CARE_PROVIDER_SITE_OTHER): Payer: BC Managed Care – PPO | Admitting: *Deleted

## 2016-11-03 DIAGNOSIS — J309 Allergic rhinitis, unspecified: Secondary | ICD-10-CM | POA: Diagnosis not present

## 2016-11-10 ENCOUNTER — Ambulatory Visit (INDEPENDENT_AMBULATORY_CARE_PROVIDER_SITE_OTHER): Payer: BC Managed Care – PPO

## 2016-11-10 DIAGNOSIS — J309 Allergic rhinitis, unspecified: Secondary | ICD-10-CM | POA: Diagnosis not present

## 2016-11-18 ENCOUNTER — Telehealth: Payer: Self-pay | Admitting: Family Medicine

## 2016-11-18 ENCOUNTER — Other Ambulatory Visit: Payer: Self-pay | Admitting: Family Medicine

## 2016-11-18 DIAGNOSIS — I10 Essential (primary) hypertension: Secondary | ICD-10-CM

## 2016-11-18 DIAGNOSIS — Z79899 Other long term (current) drug therapy: Secondary | ICD-10-CM

## 2016-11-18 DIAGNOSIS — E785 Hyperlipidemia, unspecified: Secondary | ICD-10-CM

## 2016-11-18 MED ORDER — ENALAPRIL MALEATE 10 MG PO TABS: 10.0000 mg | ORAL_TABLET | Freq: Every day | ORAL | 0 refills | Status: DC

## 2016-11-18 NOTE — Telephone Encounter (Signed)
Pt is requesting lab orders to be sent over for an upcoming appt on 10/24 with Dr. Brett Canales. Last labs in epic are expired.

## 2016-11-18 NOTE — Telephone Encounter (Signed)
Lip luiv m7 magnesium

## 2016-11-18 NOTE — Telephone Encounter (Signed)
Pt is requesting a refill on enalapril (VASOTEC) 10 MG tablet    [facility]

## 2016-11-18 NOTE — Telephone Encounter (Signed)
Spoke with patient and informed him that we sent in a refill on Enalapril. Patient verbalized understanding.

## 2016-11-19 ENCOUNTER — Other Ambulatory Visit: Payer: Self-pay | Admitting: *Deleted

## 2016-11-19 DIAGNOSIS — E785 Hyperlipidemia, unspecified: Secondary | ICD-10-CM

## 2016-11-19 DIAGNOSIS — Z79899 Other long term (current) drug therapy: Secondary | ICD-10-CM

## 2016-11-19 NOTE — Telephone Encounter (Signed)
I called and left vm, asked that he r/c. Orders sent to [facility].

## 2016-11-20 NOTE — Telephone Encounter (Signed)
Pt made aware

## 2016-12-01 ENCOUNTER — Ambulatory Visit (INDEPENDENT_AMBULATORY_CARE_PROVIDER_SITE_OTHER): Payer: BC Managed Care – PPO | Admitting: *Deleted

## 2016-12-01 DIAGNOSIS — J309 Allergic rhinitis, unspecified: Secondary | ICD-10-CM

## 2016-12-02 ENCOUNTER — Encounter: Payer: Self-pay | Admitting: Family Medicine

## 2016-12-02 ENCOUNTER — Ambulatory Visit (INDEPENDENT_AMBULATORY_CARE_PROVIDER_SITE_OTHER): Payer: BC Managed Care – PPO | Admitting: Family Medicine

## 2016-12-02 VITALS — BP 132/84 | Ht 74.0 in | Wt 242.0 lb

## 2016-12-02 DIAGNOSIS — Z9109 Other allergy status, other than to drugs and biological substances: Secondary | ICD-10-CM | POA: Diagnosis not present

## 2016-12-02 DIAGNOSIS — I10 Essential (primary) hypertension: Secondary | ICD-10-CM | POA: Diagnosis not present

## 2016-12-02 DIAGNOSIS — F411 Generalized anxiety disorder: Secondary | ICD-10-CM | POA: Diagnosis not present

## 2016-12-02 MED ORDER — ENALAPRIL MALEATE 10 MG PO TABS
10.0000 mg | ORAL_TABLET | Freq: Every day | ORAL | 11 refills | Status: DC
Start: 2016-12-02 — End: 2018-08-26

## 2016-12-02 MED ORDER — FLUTICASONE PROPIONATE 50 MCG/ACT NA SUSP: 2.0000 | Freq: Every day | NASAL | 2 refills | Status: DC

## 2016-12-02 NOTE — Progress Notes (Signed)
   Subjective:    Patient ID: Trevor HongBrent H Brisbane, male    DOB: 08/31/1970, 46 y.o.   MRN: [id number]  Hypertension  This is a chronic problem. Compliance problems include exercise.     Results for orders placed or performed during the hospital encounter of 03/01/14  Surgical pcr screen  Result Value Ref Range   MRSA, PCR NEGATIVE NEGATIVE   Staphylococcus aureus NEGATIVE NEGATIVE  CBC  Result Value Ref Range   WBC 10.0 4.0 - 10.5 K/uL   RBC 4.68 4.22 - 5.81 MIL/uL   Hemoglobin 14.7 13.0 - 17.0 g/dL   HCT 34.744.3 42.539.0 - 95.652.0 %   MCV 94.7 78.0 - 100.0 fL   MCH 31.4 26.0 - 34.0 pg   MCHC 33.2 30.0 - 36.0 g/dL   RDW 38.712.9 56.411.5 - 33.215.5 %   Platelets 261 150 - 400 K/uL  Basic metabolic panel  Result Value Ref Range   Sodium 136 135 - 145 mmol/L   Potassium 4.5 3.5 - 5.1 mmol/L   Chloride 101 96 - 112 mEq/L   CO2 25 19 - 32 mmol/L   Glucose, Bld 106 (H) 70 - 99 mg/dL   BUN 11 6 - 23 mg/dL   Creatinine, Ser 9.511.02 0.50 - 1.35 mg/dL   Calcium 9.5 8.4 - 88.410.5 mg/dL   GFR calc non Af Amer 88 (L) >90 mL/min   GFR calc Af Amer >90 >90 mL/min   Anion gap 10 5 - 15    Blood pressure medicine and blood pressure levels reviewed today with patient. Compliant with blood pressure medicine. States does not miss a dose. No obvious side effects. Blood pressure generally good when checked elsewhere. Watching salt intake.  Patient continues to experience chronic pain.Celexa was stopped. Started on Cymbalta. At this time he feels it is helping his mood and his pain. He would like to remain on it.Therefore w remove Celexa from pharmacy less  bp's elev at times    bp goes up with pain  exrcise not much to do, pain very sig with this n  Allergy shot s still help, has helped sinus infxn etter. Notes allergy shots have definitely helped. Still uses Flonae as needed  Review of Systems No headache, no major weight loss or weight gain, no chest pain no back pain abdominal pain no change in bowel habits complete  ROS otherwise negative     Objective:   Physical Exam Alert and oriented, vitals reviewed and stable, NAD ENT-TM's and ext canals WNL bilat via otoscopic exam Soft palate, tonsils and post pharynx WNL via oropharyngeal exam Neck-symmetric, no masses; thyroid nonpalpable and nontender Pulmonary-no tachypnea or accessory muscle use; Clear without wheezes via auscultation Card--no abnrml murmurs, rhythm reg and rate WNL Carotid pulses symmetric, without bruits        Assessment & Plan:  Impression 1 hypertension good control discussed/maintain blood pressure medication refills written  #2generalized anxiety disorder with chronic pain. Celexa switch to Cymbalta. Will maintain  #3 chronic allergic rhinitis. Will prescribe Flonase to use when necessary.  Greater than 50% of this 25 minute face to face visit was spent in counseling and discussion and coordination of care regarding the above diagnosis/diagnosies

## 2016-12-23 ENCOUNTER — Encounter (HOSPITAL_COMMUNITY): Payer: Self-pay | Admitting: *Deleted

## 2016-12-23 ENCOUNTER — Other Ambulatory Visit: Payer: Self-pay

## 2016-12-23 ENCOUNTER — Other Ambulatory Visit: Payer: Self-pay | Admitting: Anesthesiology

## 2016-12-23 NOTE — Progress Notes (Signed)
Pt denies SOB, chest pain, and being under the care of a cardiologist. Pt denies having a stress test, echo and cardiac cath. Pt denies having a chest x ray and EKG within the last year. Pt add BMI and neck size to complete Sleep Apnea Screening on DOS. Pt made aware to stop taking Aspirin, vitamins, fish oil and herbal medications. Do not take any NSAIDs ie: Ibuprofen, Advil, Naproxen (Aleve), Motrin, BC and Goody Powder or any medication containing Aspirin. Pt verbalized understanding of all pre-op instructions.

## 2016-12-24 ENCOUNTER — Encounter (HOSPITAL_COMMUNITY): Payer: Self-pay | Admitting: Certified Registered"

## 2016-12-24 ENCOUNTER — Encounter (HOSPITAL_COMMUNITY)
Admission: RE | Disposition: A | Discharge status: Home / Self Care | Payer: Self-pay | Source: Ambulatory Visit | Attending: Anesthesiology

## 2016-12-24 ENCOUNTER — Inpatient Hospital Stay (HOSPITAL_COMMUNITY)
Admission: RE | Admit: 2016-12-24 | Discharge: 2016-12-24 | Disposition: A | Discharge status: Home / Self Care | Payer: BLUE CROSS/BLUE SHIELD | Source: Ambulatory Visit

## 2016-12-24 ENCOUNTER — Ambulatory Visit (HOSPITAL_COMMUNITY): Payer: BC Managed Care – PPO | Admitting: Certified Registered"

## 2016-12-24 ENCOUNTER — Ambulatory Visit (HOSPITAL_COMMUNITY)
Admission: RE | Admit: 2016-12-24 | Discharge: 2016-12-24 | Disposition: A | Discharge status: Home / Self Care | Payer: BC Managed Care – PPO | Source: Ambulatory Visit | Attending: Anesthesiology | Admitting: Anesthesiology

## 2016-12-24 DIAGNOSIS — G4700 Insomnia, unspecified: Secondary | ICD-10-CM | POA: Diagnosis not present

## 2016-12-24 DIAGNOSIS — Z880 Allergy status to penicillin: Secondary | ICD-10-CM | POA: Diagnosis not present

## 2016-12-24 DIAGNOSIS — G90529 Complex regional pain syndrome I of unspecified lower limb: Secondary | ICD-10-CM | POA: Insufficient documentation

## 2016-12-24 DIAGNOSIS — F419 Anxiety disorder, unspecified: Secondary | ICD-10-CM | POA: Insufficient documentation

## 2016-12-24 DIAGNOSIS — Z79899 Other long term (current) drug therapy: Secondary | ICD-10-CM | POA: Diagnosis not present

## 2016-12-24 DIAGNOSIS — Z79891 Long term (current) use of opiate analgesic: Secondary | ICD-10-CM | POA: Insufficient documentation

## 2016-12-24 DIAGNOSIS — E785 Hyperlipidemia, unspecified: Secondary | ICD-10-CM | POA: Diagnosis not present

## 2016-12-24 DIAGNOSIS — I10 Essential (primary) hypertension: Secondary | ICD-10-CM | POA: Insufficient documentation

## 2016-12-24 DIAGNOSIS — Z7982 Long term (current) use of aspirin: Secondary | ICD-10-CM | POA: Insufficient documentation

## 2016-12-24 HISTORY — PX: SPINAL CORD STIMULATOR BATTERY EXCHANGE: SHX6202

## 2016-12-24 LAB — BASIC METABOLIC PANEL
Anion gap: 11 (ref 5–15)
BUN: 13 mg/dL (ref 6–20)
CO2: 21 mmol/L — ABNORMAL LOW (ref 22–32)
Calcium: 9.2 mg/dL (ref 8.9–10.3)
Chloride: 104 mmol/L (ref 101–111)
Creatinine, Ser: 0.96 mg/dL (ref 0.61–1.24)
GFR calc Af Amer: >60 mL/min (ref >60)
GFR calc non Af Amer: >60 mL/min (ref >60)
Glucose, Bld: 105 mg/dL — ABNORMAL HIGH (ref 65–99)
Potassium: 3.9 mmol/L (ref 3.5–5.1)
Sodium: 136 mmol/L (ref 135–145)

## 2016-12-24 LAB — CBC
HCT: 41.7 % (ref 39.0–52.0)
Hemoglobin: 14.1 g/dL (ref 13.0–17.0)
MCH: 32.3 pg (ref 26.0–34.0)
MCHC: 33.8 g/dL (ref 30.0–36.0)
MCV: 95.4 fL (ref 78.0–100.0)
Platelets: 219 K/uL (ref 150–400)
RBC: 4.37 MIL/uL (ref 4.22–5.81)
RDW: 12.9 % (ref 11.5–15.5)
WBC: 5.1 K/uL (ref 4.0–10.5)

## 2016-12-24 LAB — APTT: aPTT: 28 seconds (ref 24–36)

## 2016-12-24 LAB — PROTIME-INR
INR: 0.97
Prothrombin Time: 12.8 s (ref 11.4–15.2)

## 2016-12-24 SURGERY — SPINAL CORD STIMULATOR BATTERY EXCHANGE
Anesthesia: General

## 2016-12-24 MED ORDER — ROCURONIUM BROMIDE 10 MG/ML (PF) SYRINGE
PREFILLED_SYRINGE | INTRAVENOUS | Status: AC
  Filled 2016-12-24: qty 5

## 2016-12-24 MED ORDER — HYDROCODONE-ACETAMINOPHEN 5-325 MG PO TABS: 1.0000 | ORAL_TABLET | ORAL | 0 refills | Status: DC | PRN

## 2016-12-24 MED ORDER — PROPOFOL 10 MG/ML IV BOLUS
INTRAVENOUS | Status: AC
  Filled 2016-12-24: qty 20

## 2016-12-24 MED ORDER — LACTATED RINGERS IV SOLN
INTRAVENOUS | Status: DC
  Administered 2016-12-24: 50 mL/h via INTRAVENOUS

## 2016-12-24 MED ORDER — ONDANSETRON HCL 4 MG/2ML IJ SOLN
INTRAMUSCULAR | Status: DC | PRN
  Administered 2016-12-24: 4 mg via INTRAVENOUS

## 2016-12-24 MED ORDER — PROMETHAZINE HCL 25 MG/ML IJ SOLN: 6.2500 mg | INTRAMUSCULAR | Status: DC | PRN

## 2016-12-24 MED ORDER — OXYCODONE HCL 5 MG PO TABS: 5.0000 mg | ORAL_TABLET | Freq: Once | ORAL | Status: DC | PRN

## 2016-12-24 MED ORDER — OXYCODONE HCL 5 MG/5ML PO SOLN: 5.0000 mg | Freq: Once | ORAL | Status: DC | PRN

## 2016-12-24 MED ORDER — LIDOCAINE-EPINEPHRINE 1 %-1:100000 IJ SOLN
INTRAMUSCULAR | Status: AC
  Filled 2016-12-24: qty 1

## 2016-12-24 MED ORDER — VANCOMYCIN HCL IN DEXTROSE 1-5 GM/200ML-% IV SOLN
1000.0000 mg | INTRAVENOUS | Status: AC
  Administered 2016-12-24: 1000 mg via INTRAVENOUS
  Filled 2016-12-24: qty 200

## 2016-12-24 MED ORDER — HYDROMORPHONE HCL 1 MG/ML IJ SOLN
0.2500 mg | INTRAMUSCULAR | Status: DC | PRN
  Administered 2016-12-24: 0.5 mg via INTRAVENOUS

## 2016-12-24 MED ORDER — FENTANYL CITRATE (PF) 250 MCG/5ML IJ SOLN
INTRAMUSCULAR | Status: AC
  Filled 2016-12-24: qty 5

## 2016-12-24 MED ORDER — MIDAZOLAM HCL 2 MG/2ML IJ SOLN
INTRAMUSCULAR | Status: DC | PRN
  Administered 2016-12-24: 2 mg via INTRAVENOUS

## 2016-12-24 MED ORDER — MEPERIDINE HCL 25 MG/ML IJ SOLN: 6.2500 mg | INTRAMUSCULAR | Status: DC | PRN

## 2016-12-24 MED ORDER — ONDANSETRON HCL 4 MG/2ML IJ SOLN
INTRAMUSCULAR | Status: AC
  Filled 2016-12-24: qty 2

## 2016-12-24 MED ORDER — LIDOCAINE 2% (20 MG/ML) 5 ML SYRINGE
INTRAMUSCULAR | Status: DC | PRN
  Administered 2016-12-24: 40 mg via INTRAVENOUS

## 2016-12-24 MED ORDER — LIDOCAINE-EPINEPHRINE 1 %-1:100000 IJ SOLN
INTRAMUSCULAR | Status: DC | PRN
  Administered 2016-12-24: 15 mL

## 2016-12-24 MED ORDER — ROCURONIUM BROMIDE 10 MG/ML (PF) SYRINGE
PREFILLED_SYRINGE | INTRAVENOUS | Status: DC | PRN
  Administered 2016-12-24: 50 mg via INTRAVENOUS

## 2016-12-24 MED ORDER — CHLORHEXIDINE GLUCONATE CLOTH 2 % EX PADS: 6.0000 | MEDICATED_PAD | Freq: Once | CUTANEOUS | Status: DC

## 2016-12-24 MED ORDER — LIDOCAINE 2% (20 MG/ML) 5 ML SYRINGE
INTRAMUSCULAR | Status: AC
  Filled 2016-12-24: qty 5

## 2016-12-24 MED ORDER — 0.9 % SODIUM CHLORIDE (POUR BTL) OPTIME
TOPICAL | Status: DC | PRN
  Administered 2016-12-24: 1000 mL

## 2016-12-24 MED ORDER — PROPOFOL 10 MG/ML IV BOLUS
INTRAVENOUS | Status: DC | PRN
  Administered 2016-12-24: 200 mg via INTRAVENOUS

## 2016-12-24 MED ORDER — SUGAMMADEX SODIUM 200 MG/2ML IV SOLN
INTRAVENOUS | Status: AC
  Filled 2016-12-24: qty 2

## 2016-12-24 MED ORDER — FENTANYL CITRATE (PF) 100 MCG/2ML IJ SOLN
INTRAMUSCULAR | Status: DC | PRN
  Administered 2016-12-24 (×3): 50 ug via INTRAVENOUS

## 2016-12-24 MED ORDER — HYDROMORPHONE HCL 1 MG/ML IJ SOLN
INTRAMUSCULAR | Status: AC
  Filled 2016-12-24: qty 1

## 2016-12-24 MED ORDER — SUGAMMADEX SODIUM 200 MG/2ML IV SOLN
INTRAVENOUS | Status: DC | PRN
  Administered 2016-12-24: 200 mg via INTRAVENOUS

## 2016-12-24 MED ORDER — MIDAZOLAM HCL 2 MG/2ML IJ SOLN
INTRAMUSCULAR | Status: AC
  Filled 2016-12-24: qty 2

## 2016-12-24 SURGICAL SUPPLY — 39 items
ADH SKN CLS APL DERMABOND .7 (GAUZE/BANDAGES/DRESSINGS) ×1
BINDER ABD UNIV 12 45-62 (WOUND CARE) ×1 IMPLANT
BINDER ABDOMINAL 46IN 62IN (WOUND CARE) ×2
BLADE CLIPPER SURG (BLADE) IMPLANT
CHLORAPREP W/TINT 26ML (MISCELLANEOUS) ×2 IMPLANT
DERMABOND ADVANCED (GAUZE/BANDAGES/DRESSINGS) ×1
DERMABOND ADVANCED .7 DNX12 (GAUZE/BANDAGES/DRESSINGS) ×1 IMPLANT
DRAPE LAPAROTOMY 100X72X124 (DRAPES) ×2 IMPLANT
DRSG OPSITE POSTOP 4X6 (GAUZE/BANDAGES/DRESSINGS) ×1 IMPLANT
GENERATOR IPG PRODIGY 4.8X5.3 (Generator) ×1 IMPLANT
GLOVE BIOGEL PI IND STRL 7.5 (GLOVE) ×1 IMPLANT
GLOVE BIOGEL PI INDICATOR 7.5 (GLOVE) ×1
GLOVE ECLIPSE 7.5 STRL STRAW (GLOVE) ×2 IMPLANT
GLOVE EXAM NITRILE LRG STRL (GLOVE) IMPLANT
GLOVE EXAM NITRILE XL STR (GLOVE) IMPLANT
GLOVE EXAM NITRILE XS STR PU (GLOVE) IMPLANT
GOWN STRL REUS W/ TWL LRG LVL3 (GOWN DISPOSABLE) IMPLANT
GOWN STRL REUS W/TWL LRG LVL3 (GOWN DISPOSABLE)
KIT BASIN OR (CUSTOM PROCEDURE TRAY) ×2 IMPLANT
KIT ROOM TURNOVER OR (KITS) ×2 IMPLANT
NDL HYPO 25X1 1.5 SAFETY (NEEDLE) ×1 IMPLANT
NEEDLE HYPO 22GX1.5 SAFETY (NEEDLE) ×1 IMPLANT
NEEDLE HYPO 25X1 1.5 SAFETY (NEEDLE) ×2 IMPLANT
PACK LAMINECTOMY NEURO (CUSTOM PROCEDURE TRAY) ×2 IMPLANT
PAD ARMBOARD 7.5X6 YLW CONV (MISCELLANEOUS) ×2 IMPLANT
PRODIGY CHARGING SYSTEM NONMR US ×1 IMPLANT
PRODIGY PROGRAMMER 3856ANS ×1 IMPLANT
SPONGE LAP 4X18 X RAY DECT (DISPOSABLE) IMPLANT
SUT MNCRL AB 4-0 PS2 18 (SUTURE) ×2 IMPLANT
SUT SILK 0 (SUTURE) ×2
SUT SILK 0 MO-6 18XCR BRD 8 (SUTURE) IMPLANT
SUT SILK 2 0 PERMA HAND 18 BK (SUTURE) IMPLANT
SUT VIC AB 2-0 CP2 18 (SUTURE) ×3 IMPLANT
SYR 10ML LL (SYRINGE) IMPLANT
SYR EPIDURAL 5ML GLASS (SYRINGE) IMPLANT
TOWEL GREEN STERILE (TOWEL DISPOSABLE) ×2 IMPLANT
TOWEL GREEN STERILE FF (TOWEL DISPOSABLE) ×2 IMPLANT
WATER STERILE IRR 1000ML POUR (IV SOLUTION) ×2 IMPLANT
YANKAUER SUCT BULB TIP NO VENT (SUCTIONS) ×1 IMPLANT

## 2016-12-24 NOTE — Anesthesia Procedure Notes (Signed)
Procedure Name: Intubation Date/Time: 12/24/2016 11:48 AM Performed by: Barrington Ellison, CRNA Pre-anesthesia Checklist: Patient identified, Emergency Drugs available, Suction available and Patient being monitored Patient Re-evaluated:Patient Re-evaluated prior to induction Oxygen Delivery Method: Circle System Utilized Preoxygenation: Pre-oxygenation with 100% oxygen Induction Type: IV induction Ventilation: Mask ventilation without difficulty Laryngoscope Size: Mac and 4 Grade View: Grade I Tube type: Oral Tube size: 7.5 mm Number of attempts: 1 Airway Equipment and Method: Stylet and Oral airway Placement Confirmation: ETT inserted through vocal cords under direct vision,  positive ETCO2 and breath sounds checked- equal and bilateral Secured at: 23 cm Tube secured with: Tape Dental Injury: Teeth and Oropharynx as per pre-operative assessment

## 2016-12-24 NOTE — H&P (Signed)
Trevor Rollins is an 46 y.o. male.   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  Patient reports that was working well up until a few months ago when he had problems  with his battery functioning.   His pain management physician, Dr. Brien Few asked me to replace the battery, interrogate his leads and replace the system if needed  Past Medical History:  Diagnosis Date  . Allergy   . Anxiety   . Asthma   . DDD (degenerative disc disease)    pt denies  . Deafness in left ear   . ED (erectile dysfunction)   . Hyperlipidemia   . Hypertension   . Hypertriglyceridemia   . Insomnia     Past Surgical History:  Procedure Laterality Date  . ORIF ANKLE FRACTURE Right 03/01/2014   Procedure: OPEN REDUCTION INTERNAL FIXATION (ORIF) RIGHT ANKLE FRACTURE;  Surgeon: Marianna Payment, MD;  Location: [facility];  Service: Orthopedics;  Laterality: Right;  . SPINAL CORD STIMULATOR IMPLANT    . TONSILLECTOMY    . WISDOM TOOTH EXTRACTION      Family History  Problem Relation Age of Onset  . Cancer Sister   . Stroke Other    Social History:  reports that  has never smoked. he has never used smokeless tobacco. He reports that he drinks alcohol. He reports that he does not use drugs.  Allergies:  Allergies  Allergen Reactions  . Penicillins Other (See Comments)    Childhood reaction "redness"  Has patient had a PCN reaction causing immediate rash, facial/tongue/throat swelling, SOB or lightheadedness with hypotension: No Has patient had a PCN reaction causing severe rash involving mucus membranes or skin necrosis: No Has patient had a PCN reaction that required hospitalization: No Has patient had a PCN reaction occurring within the last 10 years: No If all of the above answers are "NO", then may proceed with Cephalosporin use.     Medications Prior to Admission  Medication Sig  Dispense Refill  . citalopram (CELEXA) 10 MG tablet Take 5 mg daily by mouth.    . diphenhydrAMINE (BENADRYL) 25 mg capsule Take 25 mg daily as needed by mouth (for allergic reaction).     . enalapril (VASOTEC) 10 MG tablet Take 1 tablet (10 mg total) by mouth daily. 30 tablet 11  . oxyCODONE-acetaminophen (PERCOCET/ROXICET) 5-325 MG tablet Take 1 tablet 2 (two) times daily by mouth.     Marland Kitchen tiZANidine (ZANAFLEX) 4 MG tablet TAKE ONE TABLET BY MOUTH EVERY 6 HOURS AS NEEDED FOR MUSCLE SPASMS. 60 tablet 5  . aspirin EC 325 MG tablet Take 1 tablet (325 mg total) by mouth 2 (two) times daily. (Patient not taking: Reported on 12/21/2016) 84 tablet 0  . cyclobenzaprine (FLEXERIL) 10 MG tablet Take 10 mg daily as needed by mouth for muscle spasms.    . diazepam (VALIUM) 5 MG tablet TAKE ONE TABLET BY MOUTH AT BEDTIME AS NEEDED. (Patient taking differently: Take 5 mg by mouth once daily as needed for muscle spasms) 30 tablet 2  . fluticasone (FLONASE) 50 MCG/ACT nasal spray Place 2 sprays into both nostrils daily. (Patient taking differently: Place 1 spray daily as needed into both nostrils for allergies. ) 16 g 2  . loratadine (CLARITIN) 10 MG tablet Take 10 mg daily as needed by mouth for allergies.     . sildenafil (REVATIO) 20 MG  tablet Two to three tabs one to two hrs prior to sex (Patient not taking: Reported on 12/21/2016) 24 tablet 5    Results for orders placed or performed during the hospital encounter of 12/24/16 (from the past 48 hour(s))  APTT     Status: None   Collection Time: 12/24/16  9:32 AM  Result Value Ref Range   aPTT 28 24 - 36 seconds  Protime-INR     Status: None   Collection Time: 12/24/16  9:32 AM  Result Value Ref Range   Prothrombin Time 12.8 11.4 - 15.2 seconds   INR 0.97   CBC     Status: None   Collection Time: 12/24/16  9:32 AM  Result Value Ref Range   WBC 5.1 4.0 - 10.5 K/uL   RBC 4.37 4.22 - 5.81 MIL/uL   Hemoglobin 14.1 13.0 - 17.0 g/dL   HCT 41.7 39.0 - 52.0  %   MCV 95.4 78.0 - 100.0 fL   MCH 32.3 26.0 - 34.0 pg   MCHC 33.8 30.0 - 36.0 g/dL   RDW 12.9 11.5 - 15.5 %   Platelets 219 150 - 400 K/uL  Basic metabolic panel     Status: Abnormal   Collection Time: 12/24/16  9:32 AM  Result Value Ref Range   Sodium 136 135 - 145 mmol/L   Potassium 3.9 3.5 - 5.1 mmol/L   Chloride 104 101 - 111 mmol/L   CO2 21 (L) 22 - 32 mmol/L   Glucose, Bld 105 (H) 65 - 99 mg/dL   BUN 13 6 - 20 mg/dL   Creatinine, Ser 0.96 0.61 - 1.24 mg/dL   Calcium 9.2 8.9 - 10.3 mg/dL   GFR calc non Af Amer >60 >60 mL/min   GFR calc Af Amer >60 >60 mL/min    Comment: (NOTE) The eGFR has been calculated using the CKD EPI equation. This calculation has not been validated in all clinical situations. eGFR's persistently <60 mL/min signify possible Chronic Kidney Disease.    Anion gap 11 5 - 15   No results found.  Review of Systems  Constitutional: Negative.   HENT: Negative.   Eyes: Negative.   Respiratory: Negative.   Cardiovascular: Negative.   Gastrointestinal: Negative.   Genitourinary: Negative.   Musculoskeletal: Negative.   Skin: Negative.   Neurological: Negative.   Endo/Heme/Allergies: Negative.   Psychiatric/Behavioral: Negative.     Blood pressure (!) 142/90, pulse 80, temperature 98.4 F (36.9 C), temperature source Oral, resp. rate 20, height 6' 1" (1.854 m), weight 108.9 kg (240 lb), SpO2 97 %. Physical Exam  Constitutional: He is oriented to person, place, and time. He appears well-developed and well-nourished.  HENT:  Head: Normocephalic and atraumatic.  Eyes: Conjunctivae are normal. Pupils are equal, round, and reactive to light.  Neck: Normal range of motion.  Cardiovascular: Normal rate.  Musculoskeletal: Normal range of motion.  Neurological: He is alert and oriented to person, place, and time.  Skin: Skin is warm and dry.  Psychiatric: He has a normal mood and affect. Judgment and thought content normal.     Assessment/Plan  CRPS  of the lower extremity, S CS therapy with failed IPG.    Plan:  Replace IPG, leads if necessary. St. Jude/Abbot rep aware  Bonna Gains, MD 12/24/2016, 11:29 AM

## 2016-12-24 NOTE — Discharge Instructions (Signed)
Dr. Aldine Grainger Post-Op Orders ° °• Ice Pack - 20 minutes on (in a pillow case), and 20 minutes off. Wear the ice pack UNDER the binder. °• Follow up in office, they will call you for an appointment in 10 days to 2 weeks. °• Increase activity gradually.   °• No lifting anything heavier than a gallon of milk (10 pounds) until seen in the office. °• Advance diet slowly as tolerated. °• Dressing care:  Keep dressing dry for 3 days, and on Post-op day 4, may shower. °• Call for fever, drainage, and redness. °• No swimming or bathing in a bathtub (do not get into standing water). °•  °

## 2016-12-24 NOTE — Transfer of Care (Signed)
Immediate Anesthesia Transfer of Care Note  Patient: Trevor MerlBrent H Guadamuz  Procedure(s) Performed: Replacement of implantable pulse generator, (N/A )  Patient Location: PACU  Anesthesia Type:General  Level of Consciousness: awake, alert  and oriented  Airway & Oxygen Therapy: Patient Spontanous Breathing  Post-op Assessment: Report given to RN and Patient moving all extremities X 4  Post vital signs: Reviewed and stable  Last Vitals:  Vitals:   12/24/16 0925  BP: (!) 142/90  Pulse: 80  Resp: 20  Temp: 36.9 C  SpO2: 97%    Last Pain:  Vitals:   12/24/16 0934  TempSrc:   PainSc: 4       Patients Stated Pain Goal: 3 (12/24/16 0934)  Complications: No apparent anesthesia complications

## 2016-12-24 NOTE — Op Note (Signed)
1) CRPS type 1 2) chronic pain  POSTOP DX: same as preop PROCEDURES PERFORMED:1) IPG replacement (St. Jude/Abbott) SURGEON:Tequia Wolman  ASSISTANT: NONE  ANESTHESIA: GETA EBL: <10cc  DESCRIPTION OF PROCEDURE: After a discussion of risks, benefits and alternatives, informed consent was obtained. The patient was taken to the OR, general anesthesia induced by the anesthesia team without difficulty, all pressure points padded, SCD's placed. A timeout was taken to verify the correct patient, position, personnel, availability of appropriate equipment, and administration of perioperative antibiotics.  The abdomen overlying the previously placed IPG widely prepped with chloraprep and draped into a sterile field. The skin and subcutaneous tissues around the patient's previous pocket incision was infiltrated with 0.25% bupivicaine 1:200K epinephrine. The subcutaneous pocket was incised with a 10 blade and using sharp, careful dissection the pocket opened and the IPG delivered onto the field. The pocket was inspected for hemostasis, which was found to be excellent. The leads were removed from the IPG, and placed into a new Abbott MRI compatible IPG. Impedances were checked and the lead found to be in good condition.  The  Incision was copiously irrigated with bacitracin-containing irrigation.The incision was closed with a deeper layer of 2-0 vicryl interrupted sutures, a more superficial layer of interrupted 2-0 vicryl, and the skin closed with a running 4-0 subcuticular monocryl suture and dermabond. Sterile dressings were applied. Needle, sponge, and instrument counts were correct x2 at the end of the case.  The patient was then carefully awakened from anesthesia, turned supine, an abdominal binder placed, and the patient taken to the recovery room. COMPLICATIONS: NONE  CONDITION: Stable throughout the course of the procedure and immediately afterward  DISPOSITION: discharge to home. Discussed care  with the patient and family. Followup in clinic will be scheduled in 10-14 days.

## 2016-12-24 NOTE — Anesthesia Postprocedure Evaluation (Signed)
Anesthesia Post Note  Patient: Trevor Rollins  Procedure(s) Performed: Replacement of implantable pulse generator, (N/A )     Patient location during evaluation: PACU Anesthesia Type: General Level of consciousness: awake and alert Pain management: pain level controlled Vital Signs Assessment: post-procedure vital signs reviewed and stable Respiratory status: spontaneous breathing, nonlabored ventilation and respiratory function stable Cardiovascular status: blood pressure returned to baseline and stable Postop Assessment: no apparent nausea or vomiting Anesthetic complications: no    Last Vitals:  Vitals:   12/24/16 1310 12/24/16 1325  BP: (!) 155/94 (!) 155/94  Pulse: 84 83  Resp: 18 18  Temp:    SpO2: 95% 96%    Last Pain:  Vitals:   12/24/16 1255  TempSrc:   PainSc: 2                  Lowella CurbWarren Ray Jossue Rubenstein

## 2016-12-24 NOTE — Anesthesia Preprocedure Evaluation (Signed)
Anesthesia Evaluation  Patient identified by MRN, date of birth, ID band Patient awake    Reviewed: Allergy & Precautions, NPO status , Patient's Chart, lab work & pertinent test results  Airway Mallampati: II  TM Distance: >3 FB Neck ROM: Full    Dental  (+) Teeth Intact, Dental Advisory Given   Pulmonary asthma ,    breath sounds clear to auscultation       Cardiovascular hypertension, Pt. on medications  Rhythm:Regular Rate:Normal     Neuro/Psych Anxiety    GI/Hepatic   Endo/Other    Renal/GU      Musculoskeletal  (+) Arthritis , Osteoarthritis,    Abdominal   Peds  Hematology   Anesthesia Other Findings   Reproductive/Obstetrics                             Anesthesia Physical  Anesthesia Plan  ASA: II  Anesthesia Plan: General   Post-op Pain Management:    Induction: Intravenous  PONV Risk Score and Plan: 2 and Ondansetron and Midazolam  Airway Management Planned: Oral ETT  Additional Equipment:   Intra-op Plan:   Post-operative Plan: Extubation in OR  Informed Consent: I have reviewed the patients History and Physical, chart, labs and discussed the procedure including the risks, benefits and alternatives for the proposed anesthesia with the patient or authorized representative who has indicated his/her understanding and acceptance.   Dental advisory given  Plan Discussed with: CRNA and Anesthesiologist  Anesthesia Plan Comments: (Htn H/O RSD    )        Anesthesia Quick Evaluation

## 2016-12-28 ENCOUNTER — Encounter (HOSPITAL_COMMUNITY): Payer: Self-pay | Admitting: Anesthesiology

## 2017-01-05 ENCOUNTER — Ambulatory Visit (INDEPENDENT_AMBULATORY_CARE_PROVIDER_SITE_OTHER): Payer: BC Managed Care – PPO | Admitting: *Deleted

## 2017-01-05 DIAGNOSIS — J309 Allergic rhinitis, unspecified: Secondary | ICD-10-CM | POA: Diagnosis not present

## 2017-02-16 ENCOUNTER — Ambulatory Visit (INDEPENDENT_AMBULATORY_CARE_PROVIDER_SITE_OTHER): Payer: BC Managed Care – PPO | Admitting: *Deleted

## 2017-02-16 DIAGNOSIS — J309 Allergic rhinitis, unspecified: Secondary | ICD-10-CM

## 2017-03-02 NOTE — Progress Notes (Signed)
VIALS EXP 01-12-19 

## 2017-03-03 DIAGNOSIS — J3089 Other allergic rhinitis: Secondary | ICD-10-CM | POA: Diagnosis not present

## 2017-03-16 ENCOUNTER — Ambulatory Visit (INDEPENDENT_AMBULATORY_CARE_PROVIDER_SITE_OTHER): Payer: BC Managed Care – PPO

## 2017-03-16 DIAGNOSIS — J309 Allergic rhinitis, unspecified: Secondary | ICD-10-CM

## 2017-03-29 ENCOUNTER — Other Ambulatory Visit: Payer: Self-pay | Admitting: Family Medicine

## 2017-03-30 ENCOUNTER — Ambulatory Visit (INDEPENDENT_AMBULATORY_CARE_PROVIDER_SITE_OTHER): Payer: BC Managed Care – PPO | Admitting: *Deleted

## 2017-03-30 DIAGNOSIS — J309 Allergic rhinitis, unspecified: Secondary | ICD-10-CM | POA: Diagnosis not present

## 2017-05-18 ENCOUNTER — Encounter: Payer: Self-pay | Admitting: *Deleted

## 2017-05-19 NOTE — Progress Notes (Signed)
[facility].  Last injection was 03/20/17.  No injection given today due to lapse in time and will need to consult physician.  Patient also wants to get his injections in [place] office so he can get injections back on schedule and not have lapse.  [facility] and will consult with Dr. Dellis AnesGallagher regarding next dose. Per Dr. Dellis AnesGallagher, [facility] and patient will receive 0.25 ml if he starts back this week on his injections.

## 2017-05-20 DIAGNOSIS — J301 Allergic rhinitis due to pollen: Secondary | ICD-10-CM | POA: Diagnosis not present

## 2017-05-26 ENCOUNTER — Ambulatory Visit (INDEPENDENT_AMBULATORY_CARE_PROVIDER_SITE_OTHER): Payer: BC Managed Care – PPO | Admitting: *Deleted

## 2017-05-26 DIAGNOSIS — J309 Allergic rhinitis, unspecified: Secondary | ICD-10-CM | POA: Diagnosis not present

## 2017-06-03 ENCOUNTER — Ambulatory Visit (INDEPENDENT_AMBULATORY_CARE_PROVIDER_SITE_OTHER): Payer: BC Managed Care – PPO | Admitting: *Deleted

## 2017-06-03 DIAGNOSIS — J309 Allergic rhinitis, unspecified: Secondary | ICD-10-CM

## 2017-06-14 ENCOUNTER — Encounter (INDEPENDENT_AMBULATORY_CARE_PROVIDER_SITE_OTHER): Payer: Self-pay | Admitting: Orthopaedic Surgery

## 2017-06-14 ENCOUNTER — Ambulatory Visit (INDEPENDENT_AMBULATORY_CARE_PROVIDER_SITE_OTHER): Payer: BC Managed Care – PPO | Admitting: Orthopaedic Surgery

## 2017-06-14 ENCOUNTER — Ambulatory Visit (INDEPENDENT_AMBULATORY_CARE_PROVIDER_SITE_OTHER): Payer: BC Managed Care – PPO | Admitting: *Deleted

## 2017-06-14 ENCOUNTER — Ambulatory Visit (INDEPENDENT_AMBULATORY_CARE_PROVIDER_SITE_OTHER): Payer: BC Managed Care – PPO

## 2017-06-14 DIAGNOSIS — J309 Allergic rhinitis, unspecified: Secondary | ICD-10-CM | POA: Diagnosis not present

## 2017-06-14 DIAGNOSIS — M25571 Pain in right ankle and joints of right foot: Secondary | ICD-10-CM

## 2017-06-14 MED ORDER — MELOXICAM 7.5 MG PO TABS: 7.5000 mg | ORAL_TABLET | Freq: Two times a day (BID) | ORAL | 2 refills | Status: DC | PRN

## 2017-06-14 NOTE — Progress Notes (Signed)
Office Visit Note   Patient: Trevor Rollins           Date of Birth: 07/22/1970           MRN: [id number] Visit Date: 06/14/2017              Requested by: Merlyn Albert, MD [address] [place], [place] [address] PCP: Merlyn Albert, MD   Assessment & Plan: Visit Diagnoses:  1. Pain in right ankle and joints of right foot     Plan: Impression is 47 year old gentleman with right ankle sprain.  Recommend meloxicam for 2 weeks and then as needed.  ASO brace when active.  He has an upcoming trip to [place] and I recommend that he have his ASO brace available.  Follow-Up Instructions: Return if symptoms worsen or fail to improve.   Orders:  Orders Placed This Encounter  Procedures  . XR Ankle Complete Right   Meds ordered this encounter  Medications  . meloxicam (MOBIC) 7.5 MG tablet    Sig: Take 1 tablet (7.5 mg total) by mouth 2 (two) times daily as needed for pain.    Dispense:  30 tablet    Refill:  2      Procedures: No procedures performed   Clinical Data: No additional findings.   Subjective: Chief Complaint  Patient presents with  . Right Ankle - Pain    S/p ORIF right ankle fracture 03/01/14. ? Injury 9 days ago while hiking    Trevor Rollins is a 47 year old gentleman who I took care of almost 3 years ago for a trimalleolar ankle fracture who comes in today with recent injury to his right ankle while hiking.  He states that he stepped down on a rock really hard and felt immediate pain.  He has had improvement since then.  He does have some swelling.  He is not had to take any medicines.  Denies any numbness and tingling.   Review of Systems  Constitutional: Negative.   All other systems reviewed and are negative.    Objective: Vital Signs: There were no vitals taken for this visit.  Physical Exam  Constitutional: He is oriented to person, place, and time. He appears well-developed and well-nourished.  Pulmonary/Chest: Effort normal.  Abdominal:  Soft.  Neurological: He is alert and oriented to person, place, and time.  Skin: Skin is warm.  Psychiatric: He has a normal mood and affect. His behavior is normal. Judgment and thought content normal.  Nursing note and vitals reviewed.   Ortho Exam Right ankle exam shows fully healed surgical scars.  Minimal swelling.  Neurovascular intact.  Normal motor and sensory function. Specialty Comments:  No specialty comments available.  Imaging: Xr Ankle Complete Right  Result Date: 06/14/2017 No acute or structural abnormalities.  Stable hardware.  Mild arthritis of the ankle joint.    PMFS History: Patient Active Problem List   Diagnosis Date Noted  . Allergic rhinoconjunctivitis   . Trimalleolar fracture of right ankle 03/01/2014  . Essential hypertension, benign 09/08/2012  . Other and unspecified hyperlipidemia 09/08/2012  . Asthma, chronic 09/08/2012  . Generalized anxiety disorder 09/08/2012  . Erectile dysfunction 09/08/2012  . Low serum testosterone level 09/08/2012  . Allergic rhinitis 08/02/2012   Past Medical History:  Diagnosis Date  . Allergy   . Anxiety   . Asthma   . DDD (degenerative disc disease)    pt denies  . Deafness in left ear   . ED (erectile  dysfunction)   . Hyperlipidemia   . Hypertension   . Hypertriglyceridemia   . Insomnia     Family History  Problem Relation Age of Onset  . Cancer Sister   . Stroke Other     Past Surgical History:  Procedure Laterality Date  . ORIF ANKLE FRACTURE Right 03/01/2014   Procedure: OPEN REDUCTION INTERNAL FIXATION (ORIF) RIGHT ANKLE FRACTURE;  Surgeon: Cheral Almas, MD;  Location: [facility] OR;  Service: Orthopedics;  Laterality: Right;  . SPINAL CORD STIMULATOR BATTERY EXCHANGE N/A 12/24/2016   Procedure: Replacement of implantable pulse generator,;  Surgeon: Odette Fraction, MD;  Location: [facility] OR;  Service: Neurosurgery;  Laterality: N/A;  Replacement of implantable pulse generator, possible replacement of leads  on spinal cord stimulator  . SPINAL CORD STIMULATOR IMPLANT    . TONSILLECTOMY    . WISDOM TOOTH EXTRACTION     Social History   Occupational History  . Not on file  Tobacco Use  . Smoking status: Never Smoker  . Smokeless tobacco: Never Used  Substance and Sexual Activity  . Alcohol use: Yes    Comment: occasional  . Drug use: No  . Sexual activity: Not on file

## 2017-06-17 ENCOUNTER — Ambulatory Visit (INDEPENDENT_AMBULATORY_CARE_PROVIDER_SITE_OTHER): Payer: BC Managed Care – PPO

## 2017-06-17 DIAGNOSIS — J309 Allergic rhinitis, unspecified: Secondary | ICD-10-CM | POA: Diagnosis not present

## 2017-06-25 ENCOUNTER — Ambulatory Visit (INDEPENDENT_AMBULATORY_CARE_PROVIDER_SITE_OTHER): Payer: BC Managed Care – PPO

## 2017-06-25 DIAGNOSIS — J309 Allergic rhinitis, unspecified: Secondary | ICD-10-CM

## 2017-07-06 ENCOUNTER — Ambulatory Visit (INDEPENDENT_AMBULATORY_CARE_PROVIDER_SITE_OTHER): Payer: BC Managed Care – PPO | Admitting: *Deleted

## 2017-07-06 DIAGNOSIS — J309 Allergic rhinitis, unspecified: Secondary | ICD-10-CM

## 2017-07-09 ENCOUNTER — Ambulatory Visit: Payer: BC Managed Care – PPO | Admitting: Family Medicine

## 2017-07-09 ENCOUNTER — Encounter: Payer: Self-pay | Admitting: Family Medicine

## 2017-07-09 ENCOUNTER — Other Ambulatory Visit: Payer: Self-pay | Admitting: *Deleted

## 2017-07-09 VITALS — BP 136/88 | Ht 73.0 in | Wt 246.0 lb

## 2017-07-09 DIAGNOSIS — M791 Myalgia, unspecified site: Secondary | ICD-10-CM | POA: Diagnosis not present

## 2017-07-09 DIAGNOSIS — F411 Generalized anxiety disorder: Secondary | ICD-10-CM | POA: Diagnosis not present

## 2017-07-09 DIAGNOSIS — I10 Essential (primary) hypertension: Secondary | ICD-10-CM

## 2017-07-09 MED ORDER — DIAZEPAM 5 MG PO TABS: 5.0000 mg | ORAL_TABLET | Freq: Every evening | ORAL | 0 refills | Status: DC | PRN

## 2017-07-09 MED ORDER — CITALOPRAM HYDROBROMIDE 20 MG PO TABS: 10.0000 mg | ORAL_TABLET | Freq: Every day | ORAL | 5 refills | Status: DC

## 2017-07-09 MED ORDER — CIPROFLOXACIN HCL 500 MG PO TABS: 500.0000 mg | ORAL_TABLET | Freq: Two times a day (BID) | ORAL | 0 refills | Status: DC

## 2017-07-09 NOTE — Telephone Encounter (Signed)
Ok six mo 

## 2017-07-09 NOTE — Progress Notes (Signed)
   Subjective:    Patient ID: Trevor Rollins, male    DOB: 02/09/1970, 47 y.o.   MRN: [id number] Patient [place]. Had hepA, typhoid and tdap at pharm on 06/01/17.   Would like to discuss what to do if he gets sick.  1 patient inquiring up precautions to avoid traveler's diarrhea and other sickness.  Not to do an event that something were to happen.  He has 2 teenagers in their mid teens.      and has muscle spasms while there.  Often when he is traveling concerned about this.  Would like to have some Valium on hand to take for muscle spasm.  Also uses it to help with sleep.  He will not be driving.  Has already received shots    Going to [place] for two Compliant with blood pressure medicine  Blood pressure medicine and blood pressure levels reviewed today with patient. Compliant with blood pressure medicine. States does not miss a dose. No obvious side effects. Blood pressure generally good when checked elsewhere. Watching salt intake.   Patient notes ongoing compliance with antidepressant medication. No obvious side effects. Reports does not miss a dose. Overall continues to help depression substantially. No thoughts of homicide or suicide. Would like to maintain medication.    Review of Systems No headache, no major weight loss or weight gain, no chest pain no back pain abdominal pain no change in bowel habits complete ROS otherwise negative     Objective:   Physical Exam  Alert and oriented, vitals reviewed and stable, NAD ENT-TM's and ext canals WNL bilat via otoscopic exam Soft palate, tonsils and post pharynx WNL via oropharyngeal exam Neck-symmetric, no masses; thyroid nonpalpable and nontender Pulmonary-no tachypnea or accessory muscle use; Clear without wheezes via auscultation Card--no abnrml murmurs, rhythm reg and rate WNL Carotid pulses symmetric, without bruits       Assessment & Plan:  1  impression traveling to [place].  Long discussion held.  Already gained.  Vaccines.  Recommend Cipro 1 twice daily up to 3 days as needed for traveler's diarrhea.  Avoidance of exposure to pathogens discussed at length  2.  Hypertension good control discussed maintain same meds  3.  Intermittent muscle spasms.  Valium helps prescription given proper use discussed  4.  generalized/anxiety clinically stable maintain Celexa  Medication written  Greater than 50% of this 25 minute face to face visit was spent in counseling and discussion and coordination of care regarding the above diagnosis/diagnosies

## 2017-07-15 ENCOUNTER — Ambulatory Visit (INDEPENDENT_AMBULATORY_CARE_PROVIDER_SITE_OTHER): Payer: BC Managed Care – PPO

## 2017-07-15 DIAGNOSIS — J309 Allergic rhinitis, unspecified: Secondary | ICD-10-CM

## 2017-07-26 ENCOUNTER — Ambulatory Visit (INDEPENDENT_AMBULATORY_CARE_PROVIDER_SITE_OTHER): Payer: BC Managed Care – PPO

## 2017-07-26 DIAGNOSIS — J309 Allergic rhinitis, unspecified: Secondary | ICD-10-CM

## 2017-08-24 ENCOUNTER — Ambulatory Visit: Payer: BC Managed Care – PPO | Admitting: Allergy & Immunology

## 2017-08-24 ENCOUNTER — Encounter: Payer: Self-pay | Admitting: Allergy & Immunology

## 2017-08-24 VITALS — BP 130/78 | HR 88 | Temp 98.2°F | Resp 18

## 2017-08-24 DIAGNOSIS — J3089 Other allergic rhinitis: Secondary | ICD-10-CM

## 2017-08-24 DIAGNOSIS — J302 Other seasonal allergic rhinitis: Secondary | ICD-10-CM | POA: Diagnosis not present

## 2017-08-24 DIAGNOSIS — J309 Allergic rhinitis, unspecified: Secondary | ICD-10-CM | POA: Diagnosis not present

## 2017-08-24 DIAGNOSIS — J0100 Acute maxillary sinusitis, unspecified: Secondary | ICD-10-CM

## 2017-08-24 MED ORDER — CLARITHROMYCIN 500 MG PO TABS: 500.0000 mg | ORAL_TABLET | Freq: Two times a day (BID) | ORAL | 0 refills | Status: AC

## 2017-08-24 MED ORDER — ALBUTEROL SULFATE 108 (90 BASE) MCG/ACT IN AEPB: 4.0000 | INHALATION_SPRAY | RESPIRATORY_TRACT | 1 refills | Status: DC | PRN

## 2017-08-24 NOTE — Patient Instructions (Addendum)
1. Seasonal and perennial allergic rhinitis - Continue with allergy shots at the same schedule. - We anticipate treating you at least through August 2021 to complete five years of therapy.  2. Acute sinuitis  - With your current symptoms and time course, antibiotics are needed: clarithromycin 500mg  twice daily for 14 days - Start the prednisone burst provided. - Add on nasal saline spray (i.e., Simply Saline) or nasal saline lavage (i.e., NeilMed) as needed prior to medicated nasal sprays. - For thick post nasal drainage, continue with the guaifenesin [phone] mg (Mucinex) twice daily as needed for mucous thinning with adequate hydration to help it work.  - We will send in an albuterol inhaler to use (4 puffs every 4-6 hours as needed).  3. Return in about 1 year (around 08/25/2018).   Please inform us of any Emergency Department visits, hospitalizations, or changes in symptoms. Call us before going to the ED for breathing or allergy symptoms since we might be able to fit you in for a sick visit. Feel free to contact us anytime with any questions, problems, or concerns.  It was a pleasure to meet you today!  Websites that have reliable patient information: 1. American Academy of Asthma, Allergy, and Immunology: www.aaaai.org 2. Food Allergy Research and Education (FARE): foodallergy.org 3. Mothers of Asthmatics: http://www.asthmacommunitynetwork.org 4. American College of Allergy, Asthma, and Immunology: MissingWeapons.cawww.acaai.org   Make sure you are registered to vote! If you have moved or changed any of your contact information, you will need to get this updated before voting!

## 2017-08-24 NOTE — Progress Notes (Addendum)
FOLLOW UP  Date of Service/Encounter:  08/24/17   Assessment:   Seasonal and perennial allergic rhinitis (grasses, weeds, trees, molds, cat, dog, dust mite, cockroach) - on allergen immunotherapy on maintenance since Aug 2016  Acute sinusitis   Plan/Recommendations:   1. Seasonal and perennial allergic rhinitis - Continue with allergy shots at the same schedule. - We anticipate treating you at least through August 2021 to complete five years of therapy.  2. Acute sinuitis  - With your current symptoms and time course, antibiotics are needed: clarithromycin 500mg  twice daily for 14 days - Start the prednisone burst provided. - Add on nasal saline spray (i.e., Simply Saline) or nasal saline lavage (i.e., NeilMed) as needed prior to medicated nasal sprays. - For thick post nasal drainage, continue with the guaifenesin ([phone] mg (Mucinex) twice daily as needed for mucous thinning with adequate hydration to help it work.  - We will send in an albuterol inhaler to use (4 puffs every 4-6 hours as needed).  3. Return in about 1 year (around 08/25/2018).  Subjective:   Trevor Rollins is a 47 y.o. male presenting today for follow up of  Chief Complaint  Patient presents with  . Nasal Congestion  . Cough    Trevor MerlBrent H Rollins has a history of the following: Patient Active Problem List   Diagnosis Date Noted  . Allergic rhinoconjunctivitis   . Trimalleolar fracture of right ankle 03/01/2014  . Essential hypertension, benign 09/08/2012  . Other and unspecified hyperlipidemia 09/08/2012  . Asthma, chronic 09/08/2012  . Generalized anxiety disorder 09/08/2012  . Erectile dysfunction 09/08/2012  . Low serum testosterone level 09/08/2012  . Allergic rhinitis 08/02/2012    History obtained from: chart review and patient.  Trevor Rollins's Primary Care Provider is Merlyn AlbertLuking, William S, MD.     Trevor Rollins is a 47 y.o. male presenting for a sick visit. He was last seen in February 2015. At that  time, he was endorsing improvement from the medications, but made the decision to start allergen immunotherapy. His last testing was performed in March 2014 and was positive to grasses, weeds, trees, molds, dust mites, cat, dog, and cockroach.   Since the last visit, Trevor Rollins has done well. He reports that [place] was in [place] a [place] of weeks. He feels that the smog in the air has made him sick. He has been sick since the end of June. He took some leftover azithromycin and prednisone with some improvement (these were leftover from previous visits). He took some dextromethorphan. He is using loratadine in the morning and two Benadryl at night.    Since starting on allergy shots, he has done fairly well. Since starting the shots, he has not had a single sinus infection. He continues to avoid certain environments, such as ragweed when it is that season. 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 He did have an albuterol inhaler to help with his symptoms.  Trevor Rollins is on allergen immunotherapy. He receives two injections. Immunotherapy script #1 contains trees, weeds and grasses. He currently receives 0.6030mL of the RED vial (1/100). Immunotherapy script #2 contains molds, dust mites, cat, dog and cockroach. He currently receives 0.4630mL of the RED vial (1/100). He started shots March 2015 and reached maintenance in August of 2016.  Otherwise, there have been no changes to his past medical history, surgical history, family history, or social history.    Review of Systems: a 14-point review of systems is pertinent for  what is mentioned in HPI.  Otherwise, all other systems were negative. Constitutional: negative other than that listed in the HPI Eyes: negative other than that listed in the HPI Ears, nose, mouth, throat, and face: negative other than that listed in the HPI Respiratory: negative other than that listed in the HPI Cardiovascular: negative other than that listed in the  HPI Gastrointestinal: negative other than that listed in the HPI Genitourinary: negative other than that listed in the HPI Integument: negative other than that listed in the HPI Hematologic: negative other than that listed in the HPI Musculoskeletal: negative other than that listed in the HPI Neurological: negative other than that listed in the HPI Allergy/Immunologic: negative other than that listed in the HPI    Objective:   Blood pressure 130/78, pulse 88, temperature 98.2 F (36.8 C), temperature source Oral, resp. rate 18, SpO2 96 %. There is no height or weight on file to calculate BMI.   Physical Exam:  General: Alert, interactive, in no acute distress. Pleasant male. Eyes: No conjunctival injection bilaterally, no discharge on the right, no discharge on the left and no Horner-Trantas dots present. PERRL bilaterally. EOMI without pain. No photophobia.  Ears: Right TM pearly gray with normal light reflex, Left TM pearly gray with normal light reflex, Right TM intact without perforation and Left TM intact without perforation.  Nose/Throat: External nose within normal limits and septum midline. Turbinates edematous and pale with thick discharge. Posterior oropharynx erythematous without cobblestoning in the posterior oropharynx. Tonsils 2+ without exudates.  Tongue without thrush. Bilateral maxillary sinus tenderness. Lungs: Decreased breath sounds with expiratory wheezing bilaterally. No increased work of breathing. CV: Normal S1/S2. No murmurs. Capillary refill <2 seconds.  Skin: Warm and dry, without lesions or rashes. Neuro:   Grossly intact. No focal deficits appreciated. Responsive to questions.  Diagnostic studies: none       Malachi Bonds, MD  [facility]

## 2017-08-31 ENCOUNTER — Ambulatory Visit (INDEPENDENT_AMBULATORY_CARE_PROVIDER_SITE_OTHER): Payer: BC Managed Care – PPO | Admitting: *Deleted

## 2017-08-31 DIAGNOSIS — J309 Allergic rhinitis, unspecified: Secondary | ICD-10-CM | POA: Diagnosis not present

## 2017-09-28 ENCOUNTER — Telehealth: Payer: Self-pay

## 2017-09-28 NOTE — Telephone Encounter (Signed)
Patient called and asked if he could transfer his vials back to [place] sue to him not being able to get here on tuesdays. Vials are being taken today and will be in [place] tomorrow.

## 2017-10-01 ENCOUNTER — Ambulatory Visit (INDEPENDENT_AMBULATORY_CARE_PROVIDER_SITE_OTHER): Payer: BC Managed Care – PPO

## 2017-10-01 DIAGNOSIS — J309 Allergic rhinitis, unspecified: Secondary | ICD-10-CM | POA: Diagnosis not present

## 2017-10-13 ENCOUNTER — Ambulatory Visit (INDEPENDENT_AMBULATORY_CARE_PROVIDER_SITE_OTHER): Payer: BC Managed Care – PPO

## 2017-10-13 DIAGNOSIS — J309 Allergic rhinitis, unspecified: Secondary | ICD-10-CM

## 2017-10-20 ENCOUNTER — Ambulatory Visit (INDEPENDENT_AMBULATORY_CARE_PROVIDER_SITE_OTHER): Payer: BC Managed Care – PPO | Admitting: *Deleted

## 2017-10-20 DIAGNOSIS — J309 Allergic rhinitis, unspecified: Secondary | ICD-10-CM | POA: Diagnosis not present

## 2017-10-29 ENCOUNTER — Ambulatory Visit (INDEPENDENT_AMBULATORY_CARE_PROVIDER_SITE_OTHER): Payer: BC Managed Care – PPO | Admitting: *Deleted

## 2017-10-29 DIAGNOSIS — J309 Allergic rhinitis, unspecified: Secondary | ICD-10-CM | POA: Diagnosis not present

## 2017-11-11 ENCOUNTER — Ambulatory Visit (INDEPENDENT_AMBULATORY_CARE_PROVIDER_SITE_OTHER): Payer: BC Managed Care – PPO | Admitting: *Deleted

## 2017-11-11 DIAGNOSIS — J309 Allergic rhinitis, unspecified: Secondary | ICD-10-CM

## 2017-11-17 NOTE — Progress Notes (Signed)
Vials exp 11-18-18 

## 2017-11-18 ENCOUNTER — Ambulatory Visit (INDEPENDENT_AMBULATORY_CARE_PROVIDER_SITE_OTHER): Payer: BC Managed Care – PPO | Admitting: *Deleted

## 2017-11-18 DIAGNOSIS — J309 Allergic rhinitis, unspecified: Secondary | ICD-10-CM

## 2017-11-19 DIAGNOSIS — J3089 Other allergic rhinitis: Secondary | ICD-10-CM

## 2018-01-05 ENCOUNTER — Other Ambulatory Visit: Payer: Self-pay | Admitting: Family Medicine

## 2018-01-19 ENCOUNTER — Telehealth: Payer: Self-pay | Admitting: *Deleted

## 2018-01-19 NOTE — Telephone Encounter (Signed)
Patient called states he has not received an allergy shot in months due to a new assignment. Patient is wondering if he can resume his shots after the holidays. Last injection 11/18/17 @ Red 1:100 schedule C dose given 0.50. Dr Dellis AnesGallagher please advise on how to proceed.

## 2018-02-01 ENCOUNTER — Encounter: Payer: Self-pay | Admitting: Family Medicine

## 2018-02-01 ENCOUNTER — Ambulatory Visit: Payer: BC Managed Care – PPO | Admitting: Family Medicine

## 2018-02-01 VITALS — BP 142/88 | Temp 97.7°F | Ht 73.0 in | Wt 249.0 lb

## 2018-02-01 DIAGNOSIS — E785 Hyperlipidemia, unspecified: Secondary | ICD-10-CM | POA: Diagnosis not present

## 2018-02-01 DIAGNOSIS — Z9109 Other allergy status, other than to drugs and biological substances: Secondary | ICD-10-CM | POA: Diagnosis not present

## 2018-02-01 DIAGNOSIS — F411 Generalized anxiety disorder: Secondary | ICD-10-CM

## 2018-02-01 DIAGNOSIS — Z79899 Other long term (current) drug therapy: Secondary | ICD-10-CM | POA: Diagnosis not present

## 2018-02-01 DIAGNOSIS — I10 Essential (primary) hypertension: Secondary | ICD-10-CM | POA: Diagnosis not present

## 2018-02-01 MED ORDER — METHYLPREDNISOLONE ACETATE 80 MG/ML IJ SUSP: 80.0000 mg | Freq: Once | INTRAMUSCULAR | Status: DC

## 2018-02-01 MED ORDER — CITALOPRAM HYDROBROMIDE 20 MG PO TABS: 10.0000 mg | ORAL_TABLET | Freq: Every day | ORAL | 1 refills | Status: DC

## 2018-02-01 MED ORDER — ENALAPRIL MALEATE 10 MG PO TABS: 10.0000 mg | ORAL_TABLET | Freq: Every day | ORAL | 1 refills | Status: DC

## 2018-02-01 MED ORDER — METHYLPREDNISOLONE ACETATE 40 MG/ML IJ SUSP
80.0000 mg | Freq: Once | INTRAMUSCULAR | Status: AC
  Administered 2018-02-01: 80 mg via INTRAMUSCULAR

## 2018-02-01 MED ORDER — FLUTICASONE PROPIONATE 50 MCG/ACT NA SUSP: 2.0000 | Freq: Every day | NASAL | 2 refills | Status: DC

## 2018-02-01 NOTE — Progress Notes (Signed)
   Subjective:    Patient ID: Trevor Rollins, male    DOB: 12/21/1970, 47 y.o.   MRN: [id number] Patient arrives with numerous concerns HPI Patient is here today to discuss his allergies. He states he has been doing Allergy shots for years,but has not had any in four months.   He states he will resume those after the beginning of the year with the allergy and asthma center   He is going on a trip over the next few days and he wants a steroid shot to help with them while he is out of town.  .htn  Blood pressure medicine and blood pressure levels reviewed today with patient. Compliant with blood pressure medicine. States does not miss a dose. No obvious side effects. Blood pressure generally good when checked elsewhere. Watching salt intake.   Flu shot already given  Not allergy shots in months  Uses inhaler prn, recently the asthma has clmed down    Anxiety and stress off with stepping away from the [employer] of  [employer].  Still sticking with the medication.  States definitely is necessary.      Review of Systems No headache, no major weight loss or weight gain, no chest pain no back pain abdominal pain no change in bowel habits complete ROS otherwise negative     Objective:   Physical Exam Alert and oriented, vitals reviewed and stable, NAD ENT-TM's and ext canals WNL bilat via otoscopic exam Soft palate, tonsils and post pharynx WNL via oropharyngeal exam Neck-symmetric, no masses; thyroid nonpalpable and nontender Pulmonary-no tachypnea or accessory muscle use; Clear without wheezes via auscultation Card--no abnrml murmurs, rhythm reg and rate WNL Carotid pulses symmetric, without bruits        Assessment & Plan:  Impression #1 allergic rhinitis/asthma.  Clinically more of a flare recently.  Discussed.  Patient request steroid injection pending initiation of allergy shots next month.  Will comply with this request which is reasonable.  2.  Generalized anxiety disorder.   Substantial element of stress medication helping will maintain discussed  3.  Hypertension.  Controlled suboptimum.  Blood pressure improved on repeat still elevated.  However did not take this morning's dose.  Will maintain same meds  Follow-up in 6 months.  Diet exercise discussed.  Definitely needs to do blood work this time has failed to do the last couple years.  Medications refilled.  Steroid shot per her request follow-up in 6 months

## 2018-02-10 NOTE — Telephone Encounter (Signed)
Patients Vials expire 03/03/2018 and only has about 0.5 ml in each. Will need to have new vials made but would like to inform patient before we do order them.

## 2018-02-10 NOTE — Telephone Encounter (Signed)
Left message to return call need to advise of plan

## 2018-02-10 NOTE — Telephone Encounter (Signed)
Sure that is fine to restart. Let's Green Vial 0.1 mL.  Malachi Bonds, MD [facility]

## 2018-02-11 NOTE — Telephone Encounter (Signed)
Patient has new Red vials in maintenance tray currently that expire 11/2018. Will mix down to green.

## 2018-02-11 NOTE — Telephone Encounter (Signed)
Note placed on flowsheet.  

## 2018-02-23 ENCOUNTER — Ambulatory Visit (INDEPENDENT_AMBULATORY_CARE_PROVIDER_SITE_OTHER): Payer: BC Managed Care – PPO | Admitting: *Deleted

## 2018-02-23 DIAGNOSIS — J309 Allergic rhinitis, unspecified: Secondary | ICD-10-CM | POA: Diagnosis not present

## 2018-02-25 ENCOUNTER — Ambulatory Visit (INDEPENDENT_AMBULATORY_CARE_PROVIDER_SITE_OTHER): Payer: BC Managed Care – PPO | Admitting: *Deleted

## 2018-02-25 DIAGNOSIS — J309 Allergic rhinitis, unspecified: Secondary | ICD-10-CM | POA: Diagnosis not present

## 2018-03-03 ENCOUNTER — Ambulatory Visit (INDEPENDENT_AMBULATORY_CARE_PROVIDER_SITE_OTHER): Payer: BC Managed Care – PPO

## 2018-03-03 DIAGNOSIS — J309 Allergic rhinitis, unspecified: Secondary | ICD-10-CM | POA: Diagnosis not present

## 2018-03-24 ENCOUNTER — Ambulatory Visit (INDEPENDENT_AMBULATORY_CARE_PROVIDER_SITE_OTHER): Payer: BC Managed Care – PPO | Admitting: *Deleted

## 2018-03-24 DIAGNOSIS — J309 Allergic rhinitis, unspecified: Secondary | ICD-10-CM

## 2018-04-14 ENCOUNTER — Ambulatory Visit (INDEPENDENT_AMBULATORY_CARE_PROVIDER_SITE_OTHER): Payer: BC Managed Care – PPO | Admitting: *Deleted

## 2018-04-14 DIAGNOSIS — J309 Allergic rhinitis, unspecified: Secondary | ICD-10-CM

## 2018-04-21 ENCOUNTER — Telehealth: Payer: Self-pay | Admitting: Family Medicine

## 2018-04-21 ENCOUNTER — Other Ambulatory Visit: Payer: Self-pay | Admitting: Family Medicine

## 2018-04-21 MED ORDER — HYDROCODONE-HOMATROPINE 5-1.5 MG/5ML PO SYRP: 5.0000 mL | ORAL_SOLUTION | Freq: Four times a day (QID) | ORAL | 0 refills | Status: AC | PRN

## 2018-04-21 MED ORDER — HYDROCODONE-HOMATROPINE 5-1.5 MG/5ML PO SYRP: 5.0000 mL | ORAL_SOLUTION | Freq: Four times a day (QID) | ORAL | 0 refills | Status: DC | PRN

## 2018-04-21 NOTE — Telephone Encounter (Signed)
Does not feel like he has a fever, and no sob. Symptoms started over night Saturday. Requesting hycodan cough to help sleep tonight til he can get seen tomorrow.

## 2018-04-21 NOTE — Telephone Encounter (Signed)
Pt.notified

## 2018-04-21 NOTE — Telephone Encounter (Signed)
Patient is requesting something for cough for tonight has appointment in the morning for cough,congestion but coughed all night last just wanting something to help him sleep tonight.[employer]

## 2018-04-21 NOTE — Telephone Encounter (Signed)
Prescription was sent in recommend to use at nighttime only caution drowsiness

## 2018-04-22 ENCOUNTER — Other Ambulatory Visit: Payer: Self-pay

## 2018-04-22 ENCOUNTER — Ambulatory Visit: Payer: BC Managed Care – PPO | Admitting: Family Medicine

## 2018-04-22 VITALS — BP 138/84 | Temp 99.3°F | Ht 73.0 in | Wt 241.0 lb

## 2018-04-22 DIAGNOSIS — J310 Chronic rhinitis: Secondary | ICD-10-CM

## 2018-04-22 DIAGNOSIS — J329 Chronic sinusitis, unspecified: Secondary | ICD-10-CM

## 2018-04-22 MED ORDER — CLARITHROMYCIN 500 MG PO TABS: 500.0000 mg | ORAL_TABLET | Freq: Two times a day (BID) | ORAL | 0 refills | Status: DC

## 2018-04-22 NOTE — Progress Notes (Signed)
   Subjective:    Patient ID: Trevor Rollins, male    DOB: [birth year], 48 y.o.   MRN: [id number]  Cough  This is a new problem. Episode onset: one week. Associated symptoms include headaches. Associated symptoms comments: congestion. Treatments tried: hycodan cough syrup.    Tend d ays ago pt was exposed to daughter with sickneess missed a few days   Sat eve  Fel t funny, with sig cough ;felt no sig fever   Headache after coughin      Some productive with phlegm   Energy zapped   Appetite down a bit         Review of Systems  Respiratory: Positive for cough.   Neurological: Positive for headaches.       Objective:   Physical Exam  Some pos gunky disch arge   Alert, mild malaise. Hydration good Vitals stable. frontal/ maxillary tenderness evident positive nasal congestion. pharynx normal neck supple  lungs clear/no crackles or wheezes. heart regular in rhythm      Assessment & Plan:  Impression rhinosinusitis likely post viral, discussed with patient. plan antibiotics prescribed. Questions answered. Symptomatic care discussed. warning signs discussed. WSL

## 2018-04-29 ENCOUNTER — Ambulatory Visit (INDEPENDENT_AMBULATORY_CARE_PROVIDER_SITE_OTHER): Payer: BC Managed Care – PPO | Admitting: *Deleted

## 2018-04-29 DIAGNOSIS — J309 Allergic rhinitis, unspecified: Secondary | ICD-10-CM

## 2018-05-02 ENCOUNTER — Telehealth: Payer: Self-pay | Admitting: Family Medicine

## 2018-05-02 NOTE — Telephone Encounter (Signed)
Pt called, still sick  + cough, + sore throat pt feels is due to cough, - SOB, - V&D, - HA, - fever  Pt requesting hycodan cough syrup & prednisone  States currently on antibiotics   Please advise & call pt    [facility]

## 2018-05-02 NOTE — Telephone Encounter (Signed)
Tried to contact patient for more info. Unable to leave message due to voicemail being full

## 2018-05-03 NOTE — Telephone Encounter (Signed)
I called and left a message to r/c to see if he still needed assistance.

## 2018-05-04 MED ORDER — DOXYCYCLINE HYCLATE 100 MG PO TABS: ORAL_TABLET | ORAL | 0 refills | Status: DC

## 2018-05-04 NOTE — Telephone Encounter (Signed)
Patient called back and states his cough has improved,still productive cough that is worse at night,hears a gargling sound in ches, No shortness of breath, no fever,no nausea, no vomiting. Patient is willing to come in this evening if need be or have something called in.Please advise.

## 2018-05-04 NOTE — Telephone Encounter (Signed)
Patient is aware of all.Medications sent in.

## 2018-05-04 NOTE — Telephone Encounter (Signed)
Doxy 100 bid for ten d 

## 2018-05-17 ENCOUNTER — Encounter: Payer: Self-pay | Admitting: Family Medicine

## 2018-05-17 ENCOUNTER — Ambulatory Visit (INDEPENDENT_AMBULATORY_CARE_PROVIDER_SITE_OTHER): Payer: BC Managed Care – PPO | Admitting: Family Medicine

## 2018-05-17 ENCOUNTER — Other Ambulatory Visit: Payer: Self-pay

## 2018-05-17 DIAGNOSIS — J683 Other acute and subacute respiratory conditions due to chemicals, gases, fumes and vapors: Secondary | ICD-10-CM

## 2018-05-17 MED ORDER — ALBUTEROL SULFATE 108 (90 BASE) MCG/ACT IN AEPB: 2.0000 | INHALATION_SPRAY | RESPIRATORY_TRACT | 1 refills | Status: DC | PRN

## 2018-05-17 MED ORDER — HYDROCODONE-HOMATROPINE 5-1.5 MG/5ML PO SYRP: 5.0000 mL | ORAL_SOLUTION | Freq: Four times a day (QID) | ORAL | 0 refills | Status: AC | PRN

## 2018-05-17 MED ORDER — FLUTICASONE PROPIONATE HFA 44 MCG/ACT IN AERO: 2.0000 | INHALATION_SPRAY | Freq: Two times a day (BID) | RESPIRATORY_TRACT | 0 refills | Status: DC

## 2018-05-17 NOTE — Progress Notes (Signed)
   Subjective:    Patient ID: Trevor Rollins, male    DOB: 09-26-1970, 48 y.o.   MRN: [id number] Video plus phone visit Cough  This is a new problem. Episode onset: one month. Cough characteristics: productive - clear to milky. Pertinent negatives include no fever or shortness of breath. Treatments tried: dayquil, nyquil, 2nd round of antibiotics ( biaxin and doxy)    Virtual Visit via Telephone Note  I connected with Jarmaine Gambles [place] on 05/17/18 at  3:00 PM EDT by telephone and verified that I am speaking with the correct person using two identifiers.   I discussed the limitations, risks, security and privacy concerns of performing an evaluation and management service by telephone and the availability of in person appointments. I also discussed with the patient that there may be a patient responsible charge related to this service. The patient expressed understanding and agreed to proceed.   History of Present Illness:    Observations/Objective:   Assessment and Plan:   Follow Up Instructions:    I discussed the assessment and treatment plan with the patient. The patient was provided an opportunity to ask questions and all were answered. The patient agreed with the plan and demonstrated an understanding of the instructions.   The patient was advised to call back or seek an in-person evaluation if the symptoms worsen or if the condition fails to improve as anticipated.  I provided 20 minutes of non-face-to-face time during this encounter.  Prior notes reviewed.  Has had 2 rounds of substantial antibiotics.  Ongoing cough.  Has seen allergist in the past with persistent cough felt to have reactive airway component   Review of Systems  Constitutional: Negative for fever.  Respiratory: Positive for cough. Negative for shortness of breath.        Objective:   Physical Exam   Virtually visit     Assessment & Plan:  Impression protracted cough.  Likely element of reactive airways  discussed.  Would like to avoid oral steroids due to the coronavirus pandemic.  We will add steroid inhaler to calm down inflammation.  Albuterol as needed.  Nightly Hycodan.  Antihistamine for allergy component.

## 2018-06-17 ENCOUNTER — Other Ambulatory Visit: Payer: Self-pay | Admitting: Family Medicine

## 2018-08-15 ENCOUNTER — Other Ambulatory Visit: Payer: Self-pay | Admitting: Family Medicine

## 2018-08-16 ENCOUNTER — Telehealth: Payer: Self-pay | Admitting: *Deleted

## 2018-08-16 NOTE — Telephone Encounter (Signed)
Patient called office and states he wants to restart allergy injections.  Patient has not been receiving injections due to not wanting to come into the office due to concerns of increasing cases of COVID-19 in [place].   Patient was informed of [facility] policy and procedures in place and office protocol regarding COVID-19.   Patient had last injection 04/29/18, Green Vial and received 0.20 ml out of each vial.  Patient would like to come in next week to restart injections.  Please advise what dose to start patient at next injection.

## 2018-08-16 NOTE — Telephone Encounter (Signed)
Let's decrease to Yellow 0.1 mL and advance on Schedule C.   Salvatore Marvel, MD [facility]

## 2018-08-18 NOTE — Telephone Encounter (Signed)
Patient informed of restarting on Yellow vial a 0.10 ml and Schedule C per Dr. Ernst Bowler.  Patient will come to office on 08/22/18 for injection.  Patient voiced understanding.

## 2018-08-22 ENCOUNTER — Ambulatory Visit (INDEPENDENT_AMBULATORY_CARE_PROVIDER_SITE_OTHER): Payer: BC Managed Care – PPO

## 2018-08-22 DIAGNOSIS — J309 Allergic rhinitis, unspecified: Secondary | ICD-10-CM | POA: Diagnosis not present

## 2018-08-26 ENCOUNTER — Other Ambulatory Visit: Payer: Self-pay | Admitting: Family Medicine

## 2018-09-02 ENCOUNTER — Telehealth: Payer: Self-pay | Admitting: Family Medicine

## 2018-09-02 MED ORDER — ONDANSETRON 8 MG PO TBDP: ORAL_TABLET | ORAL | 0 refills | Status: DC

## 2018-09-02 NOTE — Telephone Encounter (Signed)
Although it is possible that some of his symptoms could be related to heat the fatigue sore throat and mild cough point toward a viral illness  1.  If he is still having nausea he may use Zofran 8 mg, 1 tablet, 3 times daily as needed for nausea, #12 #2 I would recommend staying at home this weekend self-isolation resting Tylenol for any discomforts #3 if he gets progressively worse over the weekend such as shortness of breath difficulty breathing severe coughing consider going to urgent care center or ER #4 if his symptoms are mild but continued through the weekend the patient should consider COVID testing to be on the safe side, Monday typically results take 3 to 5 days to return and patient should self isolate until test are back

## 2018-09-02 NOTE — Addendum Note (Signed)
Addended by: Vicente Males on: 09/02/2018 05:01 PM   Modules accepted: Orders

## 2018-09-02 NOTE — Telephone Encounter (Signed)
Pt contacted office and states that they are on the way [place]. Pt vomited 2 times last night, sore throat, fatigue and mild cough. Pt states it was extremely hot at the [employer]. Pt would like recommendations on what to do. Pt unsure if he has a fever. Please advise. Thank you

## 2018-09-02 NOTE — Telephone Encounter (Signed)
Sent Zofran into [facility]. Left message to return call

## 2018-09-05 NOTE — Telephone Encounter (Signed)
Discussed with pt. Pt verbalized understanding.  °

## 2018-09-12 ENCOUNTER — Ambulatory Visit (INDEPENDENT_AMBULATORY_CARE_PROVIDER_SITE_OTHER): Payer: BC Managed Care – PPO | Admitting: *Deleted

## 2018-09-12 DIAGNOSIS — J309 Allergic rhinitis, unspecified: Secondary | ICD-10-CM

## 2018-09-15 ENCOUNTER — Ambulatory Visit (INDEPENDENT_AMBULATORY_CARE_PROVIDER_SITE_OTHER): Payer: BC Managed Care – PPO | Admitting: *Deleted

## 2018-09-15 DIAGNOSIS — J309 Allergic rhinitis, unspecified: Secondary | ICD-10-CM | POA: Diagnosis not present

## 2018-09-20 ENCOUNTER — Ambulatory Visit (INDEPENDENT_AMBULATORY_CARE_PROVIDER_SITE_OTHER): Payer: BC Managed Care – PPO | Admitting: *Deleted

## 2018-09-20 DIAGNOSIS — J309 Allergic rhinitis, unspecified: Secondary | ICD-10-CM | POA: Diagnosis not present

## 2018-09-23 ENCOUNTER — Ambulatory Visit (INDEPENDENT_AMBULATORY_CARE_PROVIDER_SITE_OTHER): Payer: BC Managed Care – PPO | Admitting: *Deleted

## 2018-09-23 DIAGNOSIS — J309 Allergic rhinitis, unspecified: Secondary | ICD-10-CM | POA: Diagnosis not present

## 2018-09-26 ENCOUNTER — Ambulatory Visit (INDEPENDENT_AMBULATORY_CARE_PROVIDER_SITE_OTHER): Payer: BC Managed Care – PPO | Admitting: *Deleted

## 2018-09-26 DIAGNOSIS — J309 Allergic rhinitis, unspecified: Secondary | ICD-10-CM

## 2018-09-30 ENCOUNTER — Ambulatory Visit (INDEPENDENT_AMBULATORY_CARE_PROVIDER_SITE_OTHER): Payer: BC Managed Care – PPO

## 2018-09-30 DIAGNOSIS — J309 Allergic rhinitis, unspecified: Secondary | ICD-10-CM

## 2018-10-11 DIAGNOSIS — J3089 Other allergic rhinitis: Secondary | ICD-10-CM | POA: Diagnosis not present

## 2018-10-11 NOTE — Progress Notes (Signed)
VIALS EXP 10-11-19 

## 2018-10-14 ENCOUNTER — Ambulatory Visit (INDEPENDENT_AMBULATORY_CARE_PROVIDER_SITE_OTHER): Payer: BC Managed Care – PPO | Admitting: *Deleted

## 2018-10-14 DIAGNOSIS — J309 Allergic rhinitis, unspecified: Secondary | ICD-10-CM

## 2018-10-20 ENCOUNTER — Other Ambulatory Visit: Payer: Self-pay | Admitting: Family Medicine

## 2018-10-20 NOTE — Telephone Encounter (Signed)
Three mo worth 

## 2018-10-21 ENCOUNTER — Ambulatory Visit (INDEPENDENT_AMBULATORY_CARE_PROVIDER_SITE_OTHER): Payer: BC Managed Care – PPO

## 2018-10-21 DIAGNOSIS — J309 Allergic rhinitis, unspecified: Secondary | ICD-10-CM | POA: Diagnosis not present

## 2018-11-09 ENCOUNTER — Ambulatory Visit (INDEPENDENT_AMBULATORY_CARE_PROVIDER_SITE_OTHER): Payer: BC Managed Care – PPO | Admitting: Family Medicine

## 2018-11-09 ENCOUNTER — Other Ambulatory Visit: Payer: Self-pay

## 2018-11-09 DIAGNOSIS — F411 Generalized anxiety disorder: Secondary | ICD-10-CM

## 2018-11-09 MED ORDER — CITALOPRAM HYDROBROMIDE 20 MG PO TABS: 20.0000 mg | ORAL_TABLET | Freq: Every day | ORAL | 1 refills | Status: DC

## 2018-11-09 MED ORDER — DIAZEPAM 5 MG PO TABS: 5.0000 mg | ORAL_TABLET | Freq: Every evening | ORAL | 1 refills | Status: DC | PRN

## 2018-11-09 NOTE — Progress Notes (Signed)
   Subjective:  Telephone only  Patient ID: Trevor Rollins, male    DOB: [birth year], 48 y.o.   MRN: [id number]  HPI  Patient calls with anxiety since the end of February. 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  Virtual Visit via Video Note  I connected with [place] on 11/09/18 at  3:00 PM EDT by a video enabled telemedicine application and verified that I am speaking with the correct person using two identifiers.  Location: Patient: home Provider: office   I discussed the limitations of evaluation and management by telemedicine and the availability of in person appointments. The patient expressed understanding and agreed to proceed.  History of Present Illness:    Observations/Objective:   Assessment and Plan:   Follow Up Instructions:    I discussed the assessment and treatment plan with the patient. The patient was provided an opportunity to ask questions and all were answered. The patient agreed with the plan and demonstrated an understanding of the instructions.   The patient was advised to call back or seek an in-person evaluation if the symptoms worsen or if the condition fails to improve as anticipated.  I provided 28 minutes of non-face-to-face time during this encounter.   Patient notes tremendous stress.  Serving on a school board.  Some parents want to be back to full-time school.  Some parents demand virtual.  Getting a lot of stress from multiple angles.  Reports progressive difficulty with sleep.  Sometimes very substantial  Does not feel he is depressed.  Feels current dose of Celexa not sufficient.   Review of Systems No chest pain no headache no back pain    Objective:   Physical Exam  Virtual      Assessment & Plan:  Impression worsening generalized anxiety disorder with substantial insomnia.  Occasional almost near panic episodes also.  No suicidal or homicidal thoughts Long discussion held  regarding options.  Will increase Celexa to 20 mg daily.  Also maintain intermittent benzodiazepine.  Importance of regular exercise strongly encouraged  Greater than 50% of this 25 minute face to face visit was spent in counseling and discussion and coordination of care regarding the above diagnosis/diagnosies

## 2018-11-12 ENCOUNTER — Encounter: Payer: Self-pay | Admitting: Family Medicine

## 2018-11-29 ENCOUNTER — Ambulatory Visit (INDEPENDENT_AMBULATORY_CARE_PROVIDER_SITE_OTHER): Payer: BC Managed Care – PPO

## 2018-11-29 DIAGNOSIS — J309 Allergic rhinitis, unspecified: Secondary | ICD-10-CM

## 2018-12-16 ENCOUNTER — Ambulatory Visit (INDEPENDENT_AMBULATORY_CARE_PROVIDER_SITE_OTHER): Payer: BC Managed Care – PPO | Admitting: *Deleted

## 2018-12-16 ENCOUNTER — Other Ambulatory Visit: Payer: Self-pay | Admitting: Allergy & Immunology

## 2018-12-16 ENCOUNTER — Other Ambulatory Visit: Payer: Self-pay | Admitting: Family Medicine

## 2018-12-16 DIAGNOSIS — J309 Allergic rhinitis, unspecified: Secondary | ICD-10-CM

## 2018-12-19 ENCOUNTER — Other Ambulatory Visit: Payer: Self-pay | Admitting: Family Medicine

## 2018-12-19 ENCOUNTER — Ambulatory Visit (INDEPENDENT_AMBULATORY_CARE_PROVIDER_SITE_OTHER): Payer: BC Managed Care – PPO

## 2018-12-19 DIAGNOSIS — J309 Allergic rhinitis, unspecified: Secondary | ICD-10-CM

## 2018-12-30 ENCOUNTER — Ambulatory Visit (INDEPENDENT_AMBULATORY_CARE_PROVIDER_SITE_OTHER): Payer: BC Managed Care – PPO

## 2018-12-30 DIAGNOSIS — J309 Allergic rhinitis, unspecified: Secondary | ICD-10-CM

## 2019-01-04 ENCOUNTER — Ambulatory Visit (INDEPENDENT_AMBULATORY_CARE_PROVIDER_SITE_OTHER): Payer: BC Managed Care – PPO | Admitting: Allergy & Immunology

## 2019-01-04 ENCOUNTER — Encounter: Payer: Self-pay | Admitting: Allergy & Immunology

## 2019-01-04 ENCOUNTER — Other Ambulatory Visit: Payer: Self-pay

## 2019-01-04 VITALS — BP 154/86 | HR 107 | Temp 98.8°F | Resp 18 | Ht 74.0 in | Wt 257.4 lb

## 2019-01-04 DIAGNOSIS — R059 Cough, unspecified: Secondary | ICD-10-CM

## 2019-01-04 DIAGNOSIS — J329 Chronic sinusitis, unspecified: Secondary | ICD-10-CM | POA: Diagnosis not present

## 2019-01-04 DIAGNOSIS — J3089 Other allergic rhinitis: Secondary | ICD-10-CM

## 2019-01-04 DIAGNOSIS — J4530 Mild persistent asthma, uncomplicated: Secondary | ICD-10-CM

## 2019-01-04 DIAGNOSIS — J302 Other seasonal allergic rhinitis: Secondary | ICD-10-CM

## 2019-01-04 DIAGNOSIS — R05 Cough: Secondary | ICD-10-CM

## 2019-01-04 MED ORDER — PREDNISONE 10 MG PO TABS: ORAL_TABLET | ORAL | 0 refills | Status: DC

## 2019-01-04 MED ORDER — ALBUTEROL SULFATE HFA 108 (90 BASE) MCG/ACT IN AERS: INHALATION_SPRAY | RESPIRATORY_TRACT | 2 refills | Status: DC

## 2019-01-04 MED ORDER — FLOVENT HFA 110 MCG/ACT IN AERO: 2.0000 | INHALATION_SPRAY | Freq: Two times a day (BID) | RESPIRATORY_TRACT | 5 refills | Status: DC

## 2019-01-04 MED ORDER — CLARITHROMYCIN 500 MG PO TABS: 500.0000 mg | ORAL_TABLET | Freq: Two times a day (BID) | ORAL | 0 refills | Status: AC

## 2019-01-04 NOTE — Progress Notes (Signed)
FOLLOW UP  Date of Service/Encounter:  01/04/19   Assessment:   Seasonal and perennial allergic rhinitis (grasses, weeds, trees, molds, cat, dog, dust mite, cockroach) - on allergen immunotherapy on maintenance since Aug 2016  Mild persistent asthma, uncomplicated  Chronic sinusitis  Chronic cough - ? GERD versus postnasal drip  Plan/Recommendations:   1. Mild persistent asthma, uncomplicated - Lung testing looked fairly good today. - We are going to increase your Flovent to the 110mcg dose instead of the 44mcg dose.  - Daily controller medication(s): Flovent 110mcg 2 puffs twice daily with spacer - Prior to physical activity: albuterol 2 puffs 10-15 minutes before physical activity. - Rescue medications: albuterol 4 puffs every 4-6 hours as needed - Changes during respiratory infections or worsening symptoms: Increase Flovent 110mcg to 4 puffs twice daily for TWO WEEKS. - Asthma control goals:  * Full participation in all desired activities (may need albuterol before activity) * Albuterol use two time or less a week on average (not counting use with activity) * Cough interfering with sleep two time or less a month * Oral steroids no more than once a year * No hospitalizations  2. Seasonal and perennial allergic rhinitis - Continue with allergy shots at the same schedule.  - Continue with over the counter nasal steroids.   3. Acute sinusitis - With your current symptoms and time course, antibiotics are needed: clarithromycin 500mg  twice daily for 14 days - Start the prednisone dose pack: Take 3 tabs (30mg ) twice daily for 3 days, then 2 tabs (20mg ) twice daily for 3 days, then 1 tab (10mg ) twice daily for 3 days, then STOP. - Continue with nasal saline spray (i.e., Simply Saline) or nasal saline lavage (i.e., NeilMed) as needed prior to medicated nasal sprays. - For thick post nasal drainage, add guaifenesin [phone] mg (Mucinex) twice daily as needed for mucous thinning  with adequate hydration to help it work.  - Call us next week with an update.   4. Return in about 3 months (around 04/06/2019). This can be an in-person, a virtual Webex or a telephone follow up visit.  Subjective:   Trevor Rollins is a 48 y.o. male presenting today for follow up of  Chief Complaint  Patient presents with   Cough    Ongoing cough and throat clearing since February    Baruch MerlBrent H Gasper has a history of the following: Patient Active Problem List   Diagnosis Date Noted   Allergic rhinoconjunctivitis    Trimalleolar fracture of right ankle 03/01/2014   Essential hypertension, benign 09/08/2012   Other and unspecified hyperlipidemia 09/08/2012   Asthma, chronic 09/08/2012   Generalized anxiety disorder 09/08/2012   Erectile dysfunction 09/08/2012   Low serum testosterone level 09/08/2012   Allergic rhinitis 08/02/2012    History obtained from: chart review and patient.  Kipp BroodBrent is a 48 y.o. male presenting for a sick visit.  He was last seen in July 2019 in our office.  At that time, he was doing very well.  Allergy shots were unremarkable and he has had markedly fewer sinus infections while on them.  At the last visit, we did treat him for sinusitis with Biaxin twice daily for 2 weeks.  We also started him on a prednisone burst. We felt that this was related to the smog exposure.   Since the last visit, he has mostly done well. However he has been coughing since February. He does have Flovent as well Albuterol. This cough has been  persistent and has not had any kind of fever during this time. It does help when he takes the rescue inhaler. This cough has been going on for years off and on. He started the albuterol in March 2020.   Kemar is on allergen immunotherapy. He receives two injections. Immunotherapy script #1 contains trees, weeds and grasses. He currently receives 0.96mL of the RED vial (1/100). Immunotherapy script #2 contains molds, dust mites, cat, dog and  cockroach. He currently receives 0.43mL of the RED vial (1/100). He started shots March 2015 and reached maintenance in August of 2016.  He denies any GERD symptoms at all. He has never been on a PPI or H2 blocker. He denies any symptoms of chest pain or midline discomfort associated with meal times. He does eat spicy foods and other GERD triggers without any problems whatsoever.   Asthma/Respiratory Symptom History: He tends to get sick with wheezing every 1-2 years. He was started on Flovent two puffs twice daily in the spring by his PCP. This was the low dose Flovent. He is unsure if it made a difference at all. He does know that the use of the albuterol helps with the coughing. He is good about taking his Flovent regularly.  Otherwise, there have been no changes to his past medical history, surgical history, family history, or social history.    Review of Systems  Constitutional: Negative.  Negative for fever, malaise/fatigue and weight loss.  HENT: Positive for sore throat. Negative for congestion, ear discharge and ear pain.        Positive for postnasal drip.  Eyes: Negative for pain, discharge and redness.  Respiratory: Positive for cough. Negative for sputum production, shortness of breath and wheezing.   Cardiovascular: Negative.  Negative for chest pain and palpitations.  Gastrointestinal: Negative for abdominal pain, constipation, diarrhea, heartburn, nausea and vomiting.  Skin: Negative.  Negative for itching and rash.  Neurological: Negative for dizziness and headaches.  Endo/Heme/Allergies: Negative for environmental allergies. Does not bruise/bleed easily.       Objective:   Blood pressure (!) 154/86, pulse (!) 107, temperature 98.8 F (37.1 C), temperature source Temporal, resp. rate 18, height 6\' 2"  (1.88 m), weight 257 lb 6.4 oz (116.8 kg), SpO2 97 %. Body mass index is 33.05 kg/m.   Physical Exam:  Physical Exam  Constitutional: He appears well-developed.    HENT:  Head: Normocephalic and atraumatic.  Right Ear: Tympanic membrane, external ear and ear canal normal.  Left Ear: Tympanic membrane, external ear and ear canal normal.  Nose: Mucosal edema and rhinorrhea present. No nasal deformity or septal deviation. No epistaxis. Right sinus exhibits maxillary sinus tenderness and frontal sinus tenderness. Left sinus exhibits maxillary sinus tenderness and frontal sinus tenderness.  Mouth/Throat: Uvula is midline and oropharynx is clear and moist. Mucous membranes are not pale and not dry.  Turbinates are edematous. No nasal polyps noted.   Eyes: Pupils are equal, round, and reactive to light. Conjunctivae and EOM are normal. Right eye exhibits no chemosis and no discharge. Left eye exhibits no chemosis and no discharge. Right conjunctiva is not injected. Left conjunctiva is not injected.  Cardiovascular: Normal rate, regular rhythm and normal heart sounds.  Respiratory: Effort normal and breath sounds normal. No accessory muscle usage. No tachypnea. No respiratory distress. He has no wheezes. He has no rhonchi. He has no rales. He exhibits no tenderness.  No wheezing or crackles noted. There are some coarse rhonchi throughout. No increased work of breathing noted.  Lymphadenopathy:    He has no cervical adenopathy.  Neurological: He is alert.  Skin: No abrasion, no petechiae and no rash noted. Rash is not papular, not vesicular and not urticarial. No erythema. No pallor.  Psychiatric: He has a normal mood and affect.     Diagnostic studies:    Spirometry: results normal (FEV1: 3.78/95%, FVC: 4.28/78%, FEV1/FVC: 88%).    Spirometry consistent with normal pattern.   Allergy Studies: none       Salvatore Marvel, MD  [facility] [place] [facility] [place]

## 2019-01-04 NOTE — Patient Instructions (Addendum)
1. Mild persistent asthma, uncomplicated - Lung testing looked fairly good today. - We are going to increase your Flovent to the 127mcg dose instead of the 81mcg dose.  - Daily controller medication(s): Flovent 145mcg 2 puffs twice daily with spacer - Prior to physical activity: albuterol 2 puffs 10-15 minutes before physical activity. - Rescue medications: albuterol 4 puffs every 4-6 hours as needed - Changes during respiratory infections or worsening symptoms: Increase Flovent 140mcg to 4 puffs twice daily for TWO WEEKS. - Asthma control goals:  * Full participation in all desired activities (may need albuterol before activity) * Albuterol use two time or less a week on average (not counting use with activity) * Cough interfering with sleep two time or less a month * Oral steroids no more than once a year * No hospitalizations  2. Seasonal and perennial allergic rhinitis - Continue with allergy shots at the same schedule.  - Continue with over the counter nasal steroids.   3. Acute sinusitis - With your current symptoms and time course, antibiotics are needed: clarithromycin 500mg  twice daily for 14 days - Start the prednisone dose pack: Take 3 tabs (30mg ) twice daily for 3 days, then 2 tabs (20mg ) twice daily for 3 days, then 1 tab (10mg ) twice daily for 3 days, then STOP. - Continue with nasal saline spray (i.e., Simply Saline) or nasal saline lavage (i.e., NeilMed) as needed prior to medicated nasal sprays. - For thick post nasal drainage, add guaifenesin [phone] mg (Mucinex) twice daily as needed for mucous thinning with adequate hydration to help it work.  - Call us next week with an update.   4. Return in about 3 months (around 04/06/2019). This can be an in-person, a virtual Webex or a telephone follow up visit.   Please inform us of any Emergency Department visits, hospitalizations, or changes in symptoms. Call us before going to the ED for breathing or allergy symptoms since we  might be able to fit you in for a sick visit. Feel free to contact us anytime with any questions, problems, or concerns.  It was a pleasure to see you again today!  Websites that have reliable patient information: 1. American Academy of Asthma, Allergy, and Immunology: www.aaaai.org 2. Food Allergy Research and Education (FARE): foodallergy.org 3. Mothers of Asthmatics: http://www.asthmacommunitynetwork.org 4. American College of Allergy, Asthma, and Immunology: www.acaai.org  "Like" [place] on Facebook and Instagram for our latest updates!      Make sure you are registered to vote! If you have moved or changed any of your contact information, you will need to get this updated before voting!  In some cases, you MAY be able to register to vote online: [email or web address]

## 2019-01-09 ENCOUNTER — Ambulatory Visit (INDEPENDENT_AMBULATORY_CARE_PROVIDER_SITE_OTHER): Payer: BC Managed Care – PPO | Admitting: *Deleted

## 2019-01-09 DIAGNOSIS — J309 Allergic rhinitis, unspecified: Secondary | ICD-10-CM | POA: Diagnosis not present

## 2019-01-12 ENCOUNTER — Telehealth: Payer: Self-pay

## 2019-01-12 ENCOUNTER — Ambulatory Visit (INDEPENDENT_AMBULATORY_CARE_PROVIDER_SITE_OTHER): Payer: BC Managed Care – PPO | Admitting: *Deleted

## 2019-01-12 DIAGNOSIS — J309 Allergic rhinitis, unspecified: Secondary | ICD-10-CM

## 2019-01-12 NOTE — Telephone Encounter (Signed)
Excellent to hear! Glad he followed through with calling us back.   Salvatore Marvel, MD [facility] [place] [facility] [place]

## 2019-01-12 NOTE — Telephone Encounter (Signed)
Patient stopped in to let you know he is doing much better and just wanted to give you an update from his visit on last week.

## 2019-02-01 ENCOUNTER — Ambulatory Visit (INDEPENDENT_AMBULATORY_CARE_PROVIDER_SITE_OTHER): Payer: BC Managed Care – PPO | Admitting: *Deleted

## 2019-02-01 DIAGNOSIS — J309 Allergic rhinitis, unspecified: Secondary | ICD-10-CM

## 2019-02-21 ENCOUNTER — Ambulatory Visit (INDEPENDENT_AMBULATORY_CARE_PROVIDER_SITE_OTHER): Payer: BC Managed Care – PPO

## 2019-02-21 DIAGNOSIS — J309 Allergic rhinitis, unspecified: Secondary | ICD-10-CM | POA: Diagnosis not present

## 2019-03-02 ENCOUNTER — Ambulatory Visit (INDEPENDENT_AMBULATORY_CARE_PROVIDER_SITE_OTHER): Payer: BC Managed Care – PPO

## 2019-03-02 DIAGNOSIS — J309 Allergic rhinitis, unspecified: Secondary | ICD-10-CM | POA: Diagnosis not present

## 2019-03-10 ENCOUNTER — Ambulatory Visit (INDEPENDENT_AMBULATORY_CARE_PROVIDER_SITE_OTHER): Payer: BC Managed Care – PPO | Admitting: *Deleted

## 2019-03-10 DIAGNOSIS — J309 Allergic rhinitis, unspecified: Secondary | ICD-10-CM

## 2019-03-15 ENCOUNTER — Encounter: Payer: Self-pay | Admitting: Family Medicine

## 2019-03-23 ENCOUNTER — Other Ambulatory Visit: Payer: Self-pay | Admitting: Family Medicine

## 2019-03-23 NOTE — Telephone Encounter (Signed)
Ok plus one, rec six mo f u in late march, start of april

## 2019-03-24 ENCOUNTER — Ambulatory Visit (INDEPENDENT_AMBULATORY_CARE_PROVIDER_SITE_OTHER): Payer: BC Managed Care – PPO | Admitting: *Deleted

## 2019-03-24 DIAGNOSIS — J309 Allergic rhinitis, unspecified: Secondary | ICD-10-CM

## 2019-03-24 NOTE — Telephone Encounter (Signed)
Please schedule

## 2019-03-24 NOTE — Telephone Encounter (Signed)
Scheduled 3/25

## 2019-03-28 ENCOUNTER — Ambulatory Visit (INDEPENDENT_AMBULATORY_CARE_PROVIDER_SITE_OTHER): Payer: BC Managed Care – PPO

## 2019-03-28 DIAGNOSIS — J309 Allergic rhinitis, unspecified: Secondary | ICD-10-CM | POA: Diagnosis not present

## 2019-04-12 ENCOUNTER — Ambulatory Visit (INDEPENDENT_AMBULATORY_CARE_PROVIDER_SITE_OTHER): Payer: BC Managed Care – PPO | Admitting: Family Medicine

## 2019-04-12 ENCOUNTER — Other Ambulatory Visit: Payer: Self-pay

## 2019-04-12 DIAGNOSIS — J310 Chronic rhinitis: Secondary | ICD-10-CM | POA: Diagnosis not present

## 2019-04-12 DIAGNOSIS — J329 Chronic sinusitis, unspecified: Secondary | ICD-10-CM | POA: Diagnosis not present

## 2019-04-12 MED ORDER — DOXYCYCLINE HYCLATE 100 MG PO TABS: 100.0000 mg | ORAL_TABLET | Freq: Two times a day (BID) | ORAL | 0 refills | Status: DC

## 2019-04-12 MED ORDER — FLUTICASONE PROPIONATE 50 MCG/ACT NA SUSP: 2.0000 | Freq: Every day | NASAL | 5 refills | Status: DC

## 2019-04-12 NOTE — Patient Instructions (Signed)
6 

## 2019-04-12 NOTE — Progress Notes (Signed)
   Subjective:  Audio only  Patient ID: Trevor Rollins, male    DOB: 16-Jan-1971, 49 y.o.   MRN: [id number]  Sinusitis This is a new problem. Episode onset: one week. Associated symptoms include congestion and coughing. (Fatigue, tooth pain when shaking pain or going up the steps, has happened in the past when he had sinus infection. ) Treatments tried: nyquil, cough suppressent, antihistamine.   Needs refill on flonase.   Virtual Visit via Telephone Note  I connected with Jarred Purtee Aw[place]allah on 04/12/19 at  2:00 PM EST by telephone and verified that I am speaking with the correct person using two identifiers.  Location: Patient: home Provider: office   I discussed the limitations, risks, security and privacy concerns of performing an evaluation and management service by telephone and the availability of in person appointments. I also discussed with the patient that there may be a patient responsible charge related to this service. The patient expressed understanding and agreed to proceed.   History of Present Illness:    Observations/Objective:   Assessment and Plan:   Follow Up Instructions:    I discussed the assessment and treatment plan with the patient. The patient was provided an opportunity to ask questions and all were answered. The patient agreed with the plan and demonstrated an understanding of the instructions.   The patient was advised to call back or seek an in-person evaluation if the symptoms worsen or if the condition fails to improve as anticipated.  I provided 22 minutes of non-face-to-face time during this encounter.  Tickle and conf  Felt a bit tired  Took cough supp and antihist  Two d ago upper teeth started hurting  Nasal gunky discharge        Review of Systems  HENT: Positive for congestion.   Respiratory: Positive for cough.        Objective:   Physical Exam  Virtual      Assessment & Plan:  Impression rhinosinusitis likely post  viral, discussed with patient. plan antibiotics prescribed. Questions answered. Symptomatic care discussed. warning signs discussed. WSL Due to potential for Covid initiating infection Covid screening was strongly encouraged/precautions and warning signs discussed

## 2019-04-14 ENCOUNTER — Ambulatory Visit: Payer: BC Managed Care – PPO | Attending: Internal Medicine

## 2019-04-14 ENCOUNTER — Ambulatory Visit: Payer: BC Managed Care – PPO | Admitting: Allergy & Immunology

## 2019-04-14 ENCOUNTER — Other Ambulatory Visit: Payer: Self-pay

## 2019-04-14 DIAGNOSIS — Z20822 Contact with and (suspected) exposure to covid-19: Secondary | ICD-10-CM

## 2019-04-15 LAB — NOVEL CORONAVIRUS, NAA: SARS-CoV-2, NAA: NOT DETECTED

## 2019-04-16 ENCOUNTER — Ambulatory Visit: Payer: BC Managed Care – PPO

## 2019-04-17 ENCOUNTER — Ambulatory Visit (INDEPENDENT_AMBULATORY_CARE_PROVIDER_SITE_OTHER): Payer: BC Managed Care – PPO

## 2019-04-17 DIAGNOSIS — J309 Allergic rhinitis, unspecified: Secondary | ICD-10-CM | POA: Diagnosis not present

## 2019-04-21 ENCOUNTER — Telehealth: Payer: Self-pay | Admitting: Family Medicine

## 2019-04-21 ENCOUNTER — Other Ambulatory Visit: Payer: Self-pay | Admitting: Family Medicine

## 2019-04-21 MED ORDER — CLARITHROMYCIN 500 MG PO TABS: 500.0000 mg | ORAL_TABLET | Freq: Two times a day (BID) | ORAL | 0 refills | Status: DC

## 2019-04-21 NOTE — Telephone Encounter (Signed)
Biaxin 500 mg was sent in take 1 twice daily with a snack for the next 7 days if ongoing trouble notify us may stop doxycycline

## 2019-04-21 NOTE — Telephone Encounter (Signed)
Patient had appointment  2 weeks ago for sinus infection and nose bleed. He states finished up antibiotic today but still has nose bleed when he blows his nose. Can you call in another round of antibiotic to [facility].

## 2019-04-21 NOTE — Telephone Encounter (Signed)
Seen 3/3 and prescribed doxy. He has one or two pills left. He states he is feeling better then sinus pain in his teeth is gone but when he blows his nose he is still having some blood. No fever. He states he did have a covid test like dr Brett Canales recommended and it was negative.

## 2019-04-21 NOTE — Telephone Encounter (Signed)
Called and discussed with pt.

## 2019-04-25 ENCOUNTER — Ambulatory Visit (INDEPENDENT_AMBULATORY_CARE_PROVIDER_SITE_OTHER): Payer: BC Managed Care – PPO | Admitting: *Deleted

## 2019-04-25 DIAGNOSIS — J309 Allergic rhinitis, unspecified: Secondary | ICD-10-CM

## 2019-04-27 ENCOUNTER — Other Ambulatory Visit: Payer: Self-pay

## 2019-04-27 ENCOUNTER — Encounter: Payer: Self-pay | Admitting: Allergy & Immunology

## 2019-04-27 ENCOUNTER — Ambulatory Visit (INDEPENDENT_AMBULATORY_CARE_PROVIDER_SITE_OTHER): Payer: BC Managed Care – PPO | Admitting: Allergy & Immunology

## 2019-04-27 DIAGNOSIS — J3089 Other allergic rhinitis: Secondary | ICD-10-CM | POA: Diagnosis not present

## 2019-04-27 DIAGNOSIS — J4530 Mild persistent asthma, uncomplicated: Secondary | ICD-10-CM | POA: Diagnosis not present

## 2019-04-27 DIAGNOSIS — J683 Other acute and subacute respiratory conditions due to chemicals, gases, fumes and vapors: Secondary | ICD-10-CM

## 2019-04-27 DIAGNOSIS — J302 Other seasonal allergic rhinitis: Secondary | ICD-10-CM

## 2019-04-27 DIAGNOSIS — R05 Cough: Secondary | ICD-10-CM | POA: Diagnosis not present

## 2019-04-27 DIAGNOSIS — R059 Cough, unspecified: Secondary | ICD-10-CM

## 2019-04-27 MED ORDER — FLOVENT HFA 110 MCG/ACT IN AERO: 2.0000 | INHALATION_SPRAY | Freq: Two times a day (BID) | RESPIRATORY_TRACT | 5 refills | Status: DC

## 2019-04-27 MED ORDER — ALBUTEROL SULFATE HFA 108 (90 BASE) MCG/ACT IN AERS: INHALATION_SPRAY | RESPIRATORY_TRACT | 2 refills | Status: DC

## 2019-04-27 NOTE — Progress Notes (Signed)
RE: Trevor Rollins MRN: [id number] DOB: [birth year] Date of Telemedicine Visit: 04/27/2019  Referring provider: Mikey Kirschner, MD Primary care provider: Mikey Kirschner, MD  Chief Complaint: Follow-up, Asthma, and Allergies   Telemedicine Follow Up Visit via Telephone: I connected with Trevor Rollins for a follow up on 04/27/19 by telephone and verified that I am speaking with the correct person using two identifiers.   I discussed the limitations, risks, security and privacy concerns of performing an evaluation and management service by telephone and the availability of in person appointments. I also discussed with the patient that there may be a patient responsible charge related to this service. The patient expressed understanding and agreed to proceed.  Patient is at home. Provider is at the office.  Visit start time: 1:28 PM Visit end time: 1:45 PM Insurance consent/check in by: [facility] consent and medical assistant/nurse: Venissa  History of Present Illness:  He is a 49 y.o. male, who is being followed for mild persistent asthma as well as seasonal and perennial allergic rhinitis. His previous allergy office visit was in November 2020 with myself.  At that visit, he was doing very well.  We did increase him to the Flovent 110 mcg dose instead of the Flovent 44 mcg dose.  We continue with allergy shots and over-the-counter nasal steroids as needed.  We did give him clarithromycin as well as prednisone.  Since the last visit, he has mostly done well. He was recently on antibiotics for a sinus infection. He was doing fine before that. He tends to get sick 2 out of every 3 times around March. He got antibiotics in November 2020. He was also sick around March 2020 and it tends to stick around. He went for 8 months and was hacking and choking up phlegm. Within two weeks of seeing me in November 2020, he was feeling better than he had in quite some time. He was great for a while and then  got sick two weeks ago that led to a sinus infection. He was using his inhaler regularly and the Mucinex. He estimates taht he is 80% better from being sick two weeks ago.   Asthma/Respiratory Symptom History: He does report that he was breathing like "an 49 year old". He was doing well until he got sick a couple of weeks ago.  Trevor Rollins's asthma has been well controlled. He has not required rescue medication, experienced nocturnal awakenings due to lower respiratory symptoms, nor have activities of daily living been limited. He has required no Emergency Department or Urgent Care visits for his asthma. He has required zero courses of systemic steroids for asthma exacerbations since the last visit. ACT score today is 19, indicating excellent asthma symptom control.   Allergic Rhinitis Symptom History: Shots are going well. He was behind during [place], but he is slowly building back up. He has had a nasal steroid.  He takes an over-the-counter antihistamine as well.  Brentis on allergen immunotherapy. Hereceives two injections. Immunotherapy script #1contains trees, weeds and grasses.Hecurrently receives 0.66mLof the RED vial (1/100). Immunotherapy script #2 containsmolds, dust mites, cat, dog and cockroach.Hecurrently receives 0.49mLof the RED vial (1/100).Hestarted shots March2015and reached maintenancein Augustof 2016.  He did stop for 3 months during the COVID-19 pandemic but then restarted when it was clear that the pandemic was not stopping.  He is having some itching around the site, but no large local reactions.  He got the first Moderna a couple of weeks ago. He has  his second injection scheduled already.  He tolerated the first injection without any issues.  Otherwise, there have been no changes to his past medical history, surgical history, family history, or social history.  Assessment and Plan:  Trevor Rollins is a 49 y.o. male with:   Seasonal and perennial allergic rhinitis(grasses,  weeds, trees, molds, cat, dog, dust mite, cockroach) - on allergen immunotherapy on maintenance since Aug 2016  Mild persistent asthma, uncomplicated  URI - clearing up with antibiotics and mucinex   1. Mild persistent asthma, uncomplicated - We will do lung testing at the next visit. - we are not going to make any medication changes at this time.  - Contact us in TWO WEEKS if you are not feeling better and we can send in a course of prednisone.   - Daily controller medication(s): Flovent 2 puffs twice daily with spacer - Prior to physical activity: albuterol 2 puffs 10-15 minutes before physical activity. - Rescue medications: albuterol 4 puffs every 4-6 hours as needed - Changes during respiratory infections or worsening symptoms: Increase Flovent to 4 puffs twice daily for TWO WEEKS. - Asthma control goals:  * Full participation in all desired activities (may need albuterol before activity) * Albuterol use two time or less a week on average (not counting use with activity) * Cough interfering with sleep two time or less a month * Oral steroids no more than once a year * No hospitalizations  2. Seasonal and perennial allergic rhinitis - Continue with allergy shots at the same schedule.  - Continue with over the counter nasal steroids.   3. Return in about 6 months (around 10/28/2019). This can be an in-person, a virtual Webex or a telephone follow up visit.  Diagnostics: None.  Medication List:  Current Outpatient Medications  Medication Sig Dispense Refill  . albuterol (VENTOLIN HFA) 108 (90 Base) MCG/ACT inhaler INHALE 4 PUFFS INTO THE LUNGS EVERY 4-6 HOURS AS NEEDED FOR UP TO 30 DAYS. 18 g 2  . citalopram (CELEXA) 20 MG tablet Take 1 tablet (20 mg total) by mouth daily. 90 tablet 1  . clarithromycin (BIAXIN) 500 MG tablet Take 1 tablet (500 mg total) by mouth 2 (two) times daily. 14 tablet 0  . Dextromethorphan-guaiFENesin (TUSSIN DM COUGH + CHEST PO) Take by  mouth.    . diazepam (VALIUM) 5 MG tablet Take 1 tablet (5 mg total) by mouth at bedtime as needed. For insomnia or muscle spasms 30 tablet 1  . diphenhydrAMINE (BENADRYL ALLERGY) 25 MG tablet Take 25 mg by mouth every 6 (six) hours as needed.    . enalapril (VASOTEC) 10 MG tablet TAKE 1 TABLET ONCE DAILY. 30 tablet 1  . fluticasone (FLONASE) 50 MCG/ACT nasal spray Place 2 sprays into both nostrils daily. 16 g 5  . fluticasone (FLOVENT HFA) 110 MCG/ACT inhaler Inhale 2 puffs into the lungs 2 (two) times daily. 1 Inhaler 5  . doxycycline (VIBRA-TABS) 100 MG tablet Take 1 tablet (100 mg total) by mouth 2 (two) times daily. (Patient not taking: Reported on 04/27/2019) 20 tablet 0   No current facility-administered medications for this visit.   Allergies: Allergies  Allergen Reactions  . Penicillins Other (See Comments)    Childhood reaction "redness"  Has patient had a PCN reaction causing immediate rash, facial/tongue/throat swelling, SOB or lightheadedness with hypotension: No Has patient had a PCN reaction causing severe rash involving mucus membranes or skin necrosis: No Has patient had a PCN reaction that required hospitalization: No  Has patient had a PCN reaction occurring within the last 10 years: No If all of the above answers are "NO", then may proceed with Cephalosporin use.    I reviewed his past medical history, social history, family history, and environmental history and no significant changes have been reported from previous visits.  Review of Systems  Constitutional: Negative for chills, diaphoresis, fatigue and fever.  HENT: Negative for congestion, ear discharge, ear pain, facial swelling, postnasal drip, rhinorrhea, sinus pressure, sinus pain, sore throat and tinnitus.   Eyes: Negative for pain, discharge and itching.  Respiratory: Positive for cough. Negative for apnea, chest tightness and shortness of breath.   Cardiovascular: Negative for chest pain.  Gastrointestinal:  Negative for diarrhea and nausea.  Musculoskeletal: Negative for arthralgias and myalgias.  Skin: Negative for rash.  Allergic/Immunologic: Negative for environmental allergies and food allergies.    Objective:  Physical exam not obtained as encounter was done via telephone.   Previous notes and tests were reviewed.  I discussed the assessment and treatment plan with the patient. The patient was provided an opportunity to ask questions and all were answered. The patient agreed with the plan and demonstrated an understanding of the instructions.   The patient was advised to call back or seek an in-person evaluation if the symptoms worsen or if the condition fails to improve as anticipated.  I provided 17 minutes of non-face-to-face time during this encounter.  It was my pleasure to participate in [place] Krull's care today. Please feel free to contact me with any questions or concerns.   Sincerely,  Alfonse Spruce, MD

## 2019-04-27 NOTE — Patient Instructions (Signed)
1. Mild persistent asthma, uncomplicated - We will do lung testing at the next visit. - we are not going to make any medication changes at this time.  - Daily controller medication(s): Flovent 2 puffs twice daily with spacer - Prior to physical activity: albuterol 2 puffs 10-15 minutes before physical activity. - Rescue medications: albuterol 4 puffs every 4-6 hours as needed - Changes during respiratory infections or worsening symptoms: Increase Flovent to 4 puffs twice daily for TWO WEEKS. - Asthma control goals:  * Full participation in all desired activities (may need albuterol before activity) * Albuterol use two time or less a week on average (not counting use with activity) * Cough interfering with sleep two time or less a month * Oral steroids no more than once a year * No hospitalizations  2. Seasonal and perennial allergic rhinitis - Continue with allergy shots at the same schedule.  - Continue with over the counter nasal steroids.   3. Return in about 6 months (around 10/28/2019). This can be an in-person, a virtual Webex or a telephone follow up visit.   Please inform us of any Emergency Department visits, hospitalizations, or changes in symptoms. Call us before going to the ED for breathing or allergy symptoms since we might be able to fit you in for a sick visit. Feel free to contact us anytime with any questions, problems, or concerns.  It was a pleasure to talk to you today!  Websites that have reliable patient information: 1. American Academy of Asthma, Allergy, and Immunology: www.aaaai.org 2. Food Allergy Research and Education (FARE): foodallergy.org 3. Mothers of Asthmatics: http://www.asthmacommunitynetwork.org 4. American College of Allergy, Asthma, and Immunology: www.acaai.org   COVID-19 Vaccine Information can be found at: [email or web address] For questions related to vaccine distribution  or appointments, please email [email or web address] or call [phone].     "Like" [place] on Facebook and Instagram for our latest updates!        Make sure you are registered to vote! If you have moved or changed any of your contact information, you will need to get this updated before voting!  In some cases, you MAY be able to register to vote online: [email or web address]

## 2019-05-02 ENCOUNTER — Ambulatory Visit (INDEPENDENT_AMBULATORY_CARE_PROVIDER_SITE_OTHER): Payer: BC Managed Care – PPO

## 2019-05-02 DIAGNOSIS — J309 Allergic rhinitis, unspecified: Secondary | ICD-10-CM | POA: Diagnosis not present

## 2019-05-04 ENCOUNTER — Other Ambulatory Visit: Payer: Self-pay

## 2019-05-04 ENCOUNTER — Ambulatory Visit (INDEPENDENT_AMBULATORY_CARE_PROVIDER_SITE_OTHER): Payer: BC Managed Care – PPO | Admitting: Family Medicine

## 2019-05-04 VITALS — BP 136/84

## 2019-05-04 DIAGNOSIS — I10 Essential (primary) hypertension: Secondary | ICD-10-CM

## 2019-05-04 DIAGNOSIS — F411 Generalized anxiety disorder: Secondary | ICD-10-CM

## 2019-05-04 MED ORDER — CITALOPRAM HYDROBROMIDE 20 MG PO TABS: 20.0000 mg | ORAL_TABLET | Freq: Every day | ORAL | 1 refills | Status: DC

## 2019-05-04 MED ORDER — ENALAPRIL MALEATE 10 MG PO TABS: 10.0000 mg | ORAL_TABLET | Freq: Every day | ORAL | 1 refills | Status: DC

## 2019-05-04 MED ORDER — DIAZEPAM 5 MG PO TABS: 5.0000 mg | ORAL_TABLET | Freq: Every evening | ORAL | 1 refills | Status: DC | PRN

## 2019-05-04 NOTE — Progress Notes (Signed)
   Subjective:  Audio only  Patient ID: Trevor Rollins, male    DOB: 1970/05/04, 49 y.o.   MRN: [id number]  Hypertension This is a chronic problem. Risk factors for coronary artery disease include male gender. There are no compliance problems.   Pt states blood pressure is doing well as far as he knows; does not check often. Pt has lost some weight; pt weighing 240 lbs at this time.  Virtual Visit via Telephone Note  I connected with Trevor Rollins on 05/04/19 at  2:00 PM EDT by telephone and verified that I am speaking with the correct person using two identifiers.  Location: Patient: home Provider: office   I discussed the limitations, risks, security and privacy concerns of performing an evaluation and management service by telephone and the availability of in person appointments. I also discussed with the patient that there may be a patient responsible charge related to this service. The patient expressed understanding and agreed to proceed.   History of Present Illness:    Observations/Objective:   Assessment and Plan:   Follow Up Instructions:    I discussed the assessment and treatment plan with the patient. The patient was provided an opportunity to ask questions and all were answered. The patient agreed with the plan and demonstrated an understanding of the instructions.   The patient was advised to call back or seek an in-person evaluation if the symptoms worsen or if the condition fails to improve as anticipated.  I provided 22 minutes of non-face-to-face time during this encounter.   Blood pressure medicine and blood pressure levels reviewed today with patient. Compliant with blood pressure medicine. States does not miss a dose. No obvious side effects. Blood pressure generally good when checked elsewhere. Watching salt intake.   Patient sees allergy specialist on a regular basis, for both asthma and allergic rhinitis  Notes generalized anxiety tendencies overall in  good control.  Compliant with citalopram.  Does not miss a dose.  Uses just occasional as needed Valium.    Review of Systems No headache no chest pain no shortness of breath    Objective:   Physical Exam   Virtual     Assessment & Plan:  Impression 1 hypertension.  Apparent decent control discussed to maintain same meds  2.  Generalized anxiety disorder discussed.  Celexa regularly with occasional use of Valium.  Diet exercise discussed and encouraged follow-up in 6 months

## 2019-05-08 ENCOUNTER — Telehealth: Payer: Self-pay | Admitting: Family Medicine

## 2019-05-08 ENCOUNTER — Ambulatory Visit (INDEPENDENT_AMBULATORY_CARE_PROVIDER_SITE_OTHER): Payer: BC Managed Care – PPO

## 2019-05-08 DIAGNOSIS — J309 Allergic rhinitis, unspecified: Secondary | ICD-10-CM

## 2019-05-08 NOTE — Telephone Encounter (Signed)
Blood pressure entered into last weeks visit.

## 2019-05-08 NOTE — Telephone Encounter (Signed)
FYI ONLY: BP FROM TODAY AT [facility]  136/84    (Patient said that Dr. Brett Canales just wanted an FYI next time he had it taken)

## 2019-05-08 NOTE — Telephone Encounter (Signed)
Please advise. Thank you

## 2019-05-08 NOTE — Telephone Encounter (Signed)
Excellent enter that as bp from last weeks visit plz

## 2019-05-15 ENCOUNTER — Ambulatory Visit (INDEPENDENT_AMBULATORY_CARE_PROVIDER_SITE_OTHER): Payer: BC Managed Care – PPO

## 2019-05-15 DIAGNOSIS — J309 Allergic rhinitis, unspecified: Secondary | ICD-10-CM | POA: Diagnosis not present

## 2019-05-22 ENCOUNTER — Ambulatory Visit (INDEPENDENT_AMBULATORY_CARE_PROVIDER_SITE_OTHER): Payer: BC Managed Care – PPO

## 2019-05-22 DIAGNOSIS — J309 Allergic rhinitis, unspecified: Secondary | ICD-10-CM

## 2019-06-02 ENCOUNTER — Ambulatory Visit (INDEPENDENT_AMBULATORY_CARE_PROVIDER_SITE_OTHER): Payer: BC Managed Care – PPO

## 2019-06-02 DIAGNOSIS — J309 Allergic rhinitis, unspecified: Secondary | ICD-10-CM | POA: Diagnosis not present

## 2019-06-08 ENCOUNTER — Ambulatory Visit (INDEPENDENT_AMBULATORY_CARE_PROVIDER_SITE_OTHER): Payer: BC Managed Care – PPO

## 2019-06-08 DIAGNOSIS — J309 Allergic rhinitis, unspecified: Secondary | ICD-10-CM | POA: Diagnosis not present

## 2019-06-20 ENCOUNTER — Ambulatory Visit (INDEPENDENT_AMBULATORY_CARE_PROVIDER_SITE_OTHER): Payer: BC Managed Care – PPO

## 2019-06-20 DIAGNOSIS — J309 Allergic rhinitis, unspecified: Secondary | ICD-10-CM | POA: Diagnosis not present

## 2019-06-26 DIAGNOSIS — J3089 Other allergic rhinitis: Secondary | ICD-10-CM | POA: Diagnosis not present

## 2019-06-27 NOTE — Progress Notes (Signed)
VIALS EXP 06-26-20 

## 2019-06-29 ENCOUNTER — Ambulatory Visit (INDEPENDENT_AMBULATORY_CARE_PROVIDER_SITE_OTHER): Payer: BC Managed Care – PPO

## 2019-06-29 DIAGNOSIS — J309 Allergic rhinitis, unspecified: Secondary | ICD-10-CM

## 2019-07-14 ENCOUNTER — Ambulatory Visit (INDEPENDENT_AMBULATORY_CARE_PROVIDER_SITE_OTHER): Payer: BC Managed Care – PPO

## 2019-07-14 DIAGNOSIS — J309 Allergic rhinitis, unspecified: Secondary | ICD-10-CM

## 2019-07-27 ENCOUNTER — Ambulatory Visit (INDEPENDENT_AMBULATORY_CARE_PROVIDER_SITE_OTHER): Payer: BC Managed Care – PPO

## 2019-07-27 DIAGNOSIS — J309 Allergic rhinitis, unspecified: Secondary | ICD-10-CM

## 2019-07-28 ENCOUNTER — Telehealth: Payer: Self-pay | Admitting: Family Medicine

## 2019-07-28 MED ORDER — PREDNISONE 20 MG PO TABS: ORAL_TABLET | ORAL | 0 refills | Status: DC

## 2019-07-28 NOTE — Telephone Encounter (Signed)
Please advise. Thank you

## 2019-07-28 NOTE — Telephone Encounter (Signed)
Prednisone 20 mg sent in with directions to take 2 a day (40 mg)  for 5 days. Pt contacted and verbalized understanding.

## 2019-07-28 NOTE — Telephone Encounter (Signed)
Pt called he is on a 8 day vacation in [place] he has a ciatic sciatic nerve acting up while on vacation. Pt said he does not want pain meds he wants Prednisone that this seam to help and he would follow up as soon as he gets back in town.  Pt call back number is  [phone]

## 2019-07-28 NOTE — Telephone Encounter (Signed)
Please see other note. Thx.  Dr. Ladona Ridgel

## 2019-07-28 NOTE — Telephone Encounter (Signed)
Left message to return call 

## 2019-07-28 NOTE — Telephone Encounter (Signed)
Patient is having sciatic nerve pain and flying out today to go hiking but wanted to know if he could use pain medication that he had. Left over for pain if he started hurting. Couldn't come to appointment. Please advise

## 2019-07-28 NOTE — Telephone Encounter (Signed)
Prednisone sent to pharmacy and pt is aware. (see other message)

## 2019-07-28 NOTE — Telephone Encounter (Signed)
Pt called back and states that he is in [place] . Pt states he has been using Aleve and is fine with Prednisone. Pt will be using [facility] [address] [place], [place] [address] [phone]  Please advise. Thank you

## 2019-08-09 ENCOUNTER — Encounter: Payer: Self-pay | Admitting: Family Medicine

## 2019-08-09 ENCOUNTER — Other Ambulatory Visit: Payer: Self-pay

## 2019-08-09 ENCOUNTER — Ambulatory Visit (INDEPENDENT_AMBULATORY_CARE_PROVIDER_SITE_OTHER): Payer: BC Managed Care – PPO | Admitting: Family Medicine

## 2019-08-09 VITALS — BP 146/90 | HR 136 | Temp 97.0°F | Ht 74.0 in | Wt 243.0 lb

## 2019-08-09 DIAGNOSIS — M5431 Sciatica, right side: Secondary | ICD-10-CM | POA: Diagnosis not present

## 2019-08-09 DIAGNOSIS — G9050 Complex regional pain syndrome I, unspecified: Secondary | ICD-10-CM | POA: Insufficient documentation

## 2019-08-09 MED ORDER — METHOCARBAMOL 500 MG PO TABS: 500.0000 mg | ORAL_TABLET | Freq: Four times a day (QID) | ORAL | 0 refills | Status: DC | PRN

## 2019-08-09 MED ORDER — DICLOFENAC SODIUM 75 MG PO TBEC: 75.0000 mg | DELAYED_RELEASE_TABLET | Freq: Two times a day (BID) | ORAL | 0 refills | Status: DC

## 2019-08-09 NOTE — Progress Notes (Signed)
Patient ID: Trevor Rollins, male    DOB: 06-Apr-1970, 49 y.o.   MRN: [id number]    Chief Complaint  Patient presents with  . Sciatica Pain    Pt here to discuss sciatic pain on right side. Stimulator surgery in 2007 and has stimulator placed under right side of abdominal area. Pt did take a course of Prednisone a few weeks ago and that did help. Right side pain from buttock to thigh area. Pt tries Aleve to help.   . Back Pain   Subjective:    HPI Pt having pain on rt buttock, has improved after the prednisone given while he was out of town on vacation on 07/28/19. Pt stating occ having issues with this since then it flares up. Not taking anything daily for pain. occ taking aleve.  Pt stating the right sciatica flared up when he was "running and chasing geese off his property." No imaging of back in our computer system of lumbar spine. Seen in past by pain specialist, Dr. Faylene Million with [facility].  Stating he has CRPS of right foot.  Also seen by pain management for Dr. Murray Hodgkins. In past has been on 900mg  neurontin qhs, percocet qid, zanaflex, ultram.- per care everywhere chart.  Pt stating the prednisone helping when taking it, once he stopped he said the pain returned. Has had long history of sciatica and RSD on rt leg. Hasn't had flare up of sciatica since 2008, per pt. Pt stating has some old percocet if needed for pain, but hasn't had to take any for years since stimulator went in.  Seeing pain specialist, Dr. 2009- for RSD and stimulator. Stimulator installed in 2007. Had to have the stimulator moved from rt buttock to rt side abdomen, since then hasn't had trouble with sciatica.    Medical History Trevor Rollins has a past medical history of Allergy, Anxiety, Asthma, DDD (degenerative disc disease), Deafness in left ear, ED (erectile dysfunction), Hyperlipidemia, Hypertension, Hypertriglyceridemia, and Insomnia.   Outpatient Encounter Medications as of 08/09/2019  Medication Sig  . albuterol (VENTOLIN  HFA) 108 (90 Base) MCG/ACT inhaler INHALE 4 PUFFS INTO THE LUNGS EVERY 4-6 HOURS AS NEEDED FOR UP TO 30 DAYS.  08/11/2019 citalopram (CELEXA) 20 MG tablet Take 1 tablet (20 mg total) by mouth daily.  Marland Kitchen Dextromethorphan-guaiFENesin (TUSSIN DM COUGH + CHEST PO) Take by mouth.  . diazepam (VALIUM) 5 MG tablet Take 1 tablet (5 mg total) by mouth at bedtime as needed. For insomnia or muscle spasms  . diphenhydrAMINE (BENADRYL ALLERGY) 25 MG tablet Take 25 mg by mouth every 6 (six) hours as needed.  . enalapril (VASOTEC) 10 MG tablet Take 1 tablet (10 mg total) by mouth daily.  . fluticasone (FLONASE) 50 MCG/ACT nasal spray Place 2 sprays into both nostrils daily.  . fluticasone (FLOVENT HFA) 110 MCG/ACT inhaler Inhale 2 puffs into the lungs 2 (two) times daily.  . [DISCONTINUED] predniSONE (DELTASONE) 20 MG tablet Take 2 tablet (40 mg) po daily for 5 days  . diclofenac (VOLTAREN) 75 MG EC tablet Take 1 tablet (75 mg total) by mouth 2 (two) times daily.  . methocarbamol (ROBAXIN) 500 MG tablet Take 1 tablet (500 mg total) by mouth every 6 (six) hours as needed for muscle spasms.   No facility-administered encounter medications on file as of 08/09/2019.     Review of Systems  Constitutional: Negative for chills and fever.  Genitourinary: Negative for difficulty urinating, dysuria and hematuria.  Musculoskeletal: Negative for back pain.       +  rt buttock pain.  Skin: Negative for rash.  Neurological: Negative for weakness and numbness.     Vitals BP (!) 146/90   Pulse (!) 136   Temp (!) 97 F (36.1 C)   Ht 6\' 2"  (1.88 m)   Wt 243 lb (110.2 kg)   SpO2 98%   BMI 31.20 kg/m   Objective:   Physical Exam Vitals and nursing note reviewed.  Constitutional:      General: He is in acute distress (mild).     Appearance: Normal appearance. He is not ill-appearing.  HENT:     Head: Normocephalic.     Nose: Nose normal. No congestion.     Mouth/Throat:     Mouth: Mucous membranes are moist.      Pharynx: No oropharyngeal exudate.  Eyes:     Extraocular Movements: Extraocular movements intact.     Conjunctiva/sclera: Conjunctivae normal.     Pupils: Pupils are equal, round, and reactive to light.  Cardiovascular:     Rate and Rhythm: Tachycardia present.  Pulmonary:     Effort: Pulmonary effort is normal. No respiratory distress.  Musculoskeletal:        General: Normal range of motion.     Right lower leg: No edema.     Left lower leg: No edema.     Comments: +ttp over right buttock/piriformis. Neg slr on left, and positive SLR on right. No ttp over lumbar spinous process. Surgical scar present over lumbar area. No rash, normal skin inspection.  Skin:    General: Skin is warm and dry.     Findings: No rash.  Neurological:     General: No focal deficit present.     Mental Status: He is alert and oriented to person, place, and time.     Cranial Nerves: No cranial nerve deficit.  Psychiatric:        Mood and Affect: Mood normal.        Behavior: Behavior normal.        Thought Content: Thought content normal.        Judgment: Judgment normal.      Assessment and Plan   1. Sciatica of right side - Ambulatory referral to Orthopedic Surgery  2. RSD (reflex sympathetic dystrophy)    Pt given robaxin for muscle spasm and diclofenac bid. otc cream or lidocaine patches prn. Rest/ice/heat. Advising against long term or repeat prednisone tx due to other side effects.  Recommending anti-inflammatory and spasm medication and topical tx for now.  Gave pt referral to ortho for evaluation of sciatica.  Pt to call back with any concerns.    F/u prn.

## 2019-08-17 ENCOUNTER — Telehealth: Payer: Self-pay | Admitting: Orthopaedic Surgery

## 2019-08-17 MED ORDER — PREDNISONE 5 MG (21) PO TBPK
ORAL_TABLET | ORAL | 0 refills | Status: DC
Start: 2019-08-17 — End: 2019-08-29

## 2019-08-17 NOTE — Telephone Encounter (Signed)
Patient is scheduled to see you on 08/29/19 when he returns from being out of town.  He just called and said that he is having a lot of problems and wants to know if you could send in a prescription for Prednisone?  He is in [place] at the present time. I told him I was unsure if that could be done.  He will be looking up a pharmacy that is close to him if you can do that.  Can you send prescriptions to another state for him?

## 2019-08-17 NOTE — Telephone Encounter (Signed)
I will let you know when I talk to him.

## 2019-08-17 NOTE — Telephone Encounter (Signed)
I can send to a national pharmacy here like [facility] or [facility] or [facility] and he can call and have it transferred to where he is.  It has to be same Museum/gallery conservator.  He needs to call the local pharmacy there and the one here to see if they will accept it.  You can call me and give me pharmacy to call in here if it works out.  I keep my phone on all the time.

## 2019-08-17 NOTE — Telephone Encounter (Signed)
Patient requests prescription for Prednisone  He requests this be sent to [facility] in [place]

## 2019-08-29 ENCOUNTER — Other Ambulatory Visit: Payer: Self-pay

## 2019-08-29 ENCOUNTER — Encounter: Payer: Self-pay | Admitting: Orthopaedic Surgery

## 2019-08-29 ENCOUNTER — Ambulatory Visit: Payer: BC Managed Care – PPO | Admitting: Orthopaedic Surgery

## 2019-08-29 ENCOUNTER — Ambulatory Visit: Payer: BC Managed Care – PPO

## 2019-08-29 VITALS — BP 171/111 | HR 109 | Ht 73.5 in | Wt 233.0 lb

## 2019-08-29 DIAGNOSIS — M5441 Lumbago with sciatica, right side: Secondary | ICD-10-CM

## 2019-08-29 DIAGNOSIS — G8929 Other chronic pain: Secondary | ICD-10-CM

## 2019-08-29 MED ORDER — PREDNISONE 5 MG (21) PO TBPK
ORAL_TABLET | ORAL | 0 refills | Status: DC
Start: 2019-08-29 — End: 2019-09-19

## 2019-08-29 MED ORDER — HYDROCODONE-ACETAMINOPHEN 7.5-325 MG PO TABS: ORAL_TABLET | ORAL | 0 refills | Status: DC

## 2019-08-29 NOTE — Progress Notes (Signed)
Subjective:    Patient ID: Trevor Rollins, male    DOB: February 06, 1971, 49 y.o.   MRN: [id number]  HPI He has history of RSD of the right lower extremity and history of lower back pain and right sided sciatica.  He had nerve stimulator done in 2008 at [facility] and had a revision of it two years ago.  It was originally in the right buttocks area and moved to the right lower abdomen.  He has done well with it.  Two months ago he was trying to scare away some geese from his property.  He sprinted to scare them.  After that he started having some lower back pain again.  He went out west and visited [address] this past month.  While there he had marked pain and sciatica.  He called for medicine.  Dr. Gerda Diss has retired.  He called this office and I called it to [facility] in [place] and they relayed it to out west.  He said it really helped.  He took the medicine but about two days after the dose pack had finished, he began to have pain again.  He has pain running from the back, past the right hip to the mid thigh and sometimes below the right knee.  He has no trauma other than that mentioned above.  He has no weakness, no redness.  He has no change in his medicine.   Review of Systems  Constitutional: Positive for activity change.  Musculoskeletal: Positive for arthralgias, back pain and myalgias.  Allergic/Immunologic: Positive for environmental allergies.  All other systems reviewed and are negative.  For Review of Systems, all other systems reviewed and are negative.  The following is a summary of the past history medically, past history surgically, known current medicines, social history and family history.  This information is gathered electronically by the computer from prior information and documentation.  I review this each visit and have found including this information at this point in the chart is beneficial and informative.   Past Medical History:  Diagnosis Date  . Allergy   .  Anxiety   . Asthma   . DDD (degenerative disc disease)    pt denies  . Deafness in left ear   . ED (erectile dysfunction)   . Hyperlipidemia   . Hypertension   . Hypertriglyceridemia   . Insomnia     Past Surgical History:  Procedure Laterality Date  . ORIF ANKLE FRACTURE Right 03/01/2014   Procedure: OPEN REDUCTION INTERNAL FIXATION (ORIF) RIGHT ANKLE FRACTURE;  Surgeon: Cheral Almas, MD;  Location: [facility] OR;  Service: Orthopedics;  Laterality: Right;  . SPINAL CORD STIMULATOR BATTERY EXCHANGE N/A 12/24/2016   Procedure: Replacement of implantable pulse generator,;  Surgeon: Odette Fraction, MD;  Location: [facility] OR;  Service: Neurosurgery;  Laterality: N/A;  Replacement of implantable pulse generator, possible replacement of leads on spinal cord stimulator  . SPINAL CORD STIMULATOR IMPLANT    . TONSILLECTOMY    . WISDOM TOOTH EXTRACTION      Current Outpatient Medications on File Prior to Visit  Medication Sig Dispense Refill  . albuterol (VENTOLIN HFA) 108 (90 Base) MCG/ACT inhaler INHALE 4 PUFFS INTO THE LUNGS EVERY 4-6 HOURS AS NEEDED FOR UP TO 30 DAYS. 18 g 2  . citalopram (CELEXA) 20 MG tablet Take 1 tablet (20 mg total) by mouth daily. 90 tablet 1  . diazepam (VALIUM) 5 MG tablet Take 1 tablet (5 mg total) by mouth at bedtime  as needed. For insomnia or muscle spasms 30 tablet 1  . diphenhydrAMINE (BENADRYL ALLERGY) 25 MG tablet Take 25 mg by mouth every 6 (six) hours as needed.    . enalapril (VASOTEC) 10 MG tablet Take 1 tablet (10 mg total) by mouth daily. 90 tablet 1  . fluticasone (FLONASE) 50 MCG/ACT nasal spray Place 2 sprays into both nostrils daily. 16 g 5  . fluticasone (FLOVENT HFA) 110 MCG/ACT inhaler Inhale 2 puffs into the lungs 2 (two) times daily. 1 Inhaler 5  . methocarbamol (ROBAXIN) 500 MG tablet Take 1 tablet (500 mg total) by mouth every 6 (six) hours as needed for muscle spasms. 60 tablet 0  . diclofenac (VOLTAREN) 75 MG EC tablet Take 1 tablet (75 mg total)  by mouth 2 (two) times daily. (Patient not taking: Reported on 08/29/2019) 30 tablet 0   No current facility-administered medications on file prior to visit.    Social History   Socioeconomic History  . Marital status: Married    Spouse name: Not on file  . Number of children: Not on file  . Years of education: Not on file  . Highest education level: Not on file  Occupational History  . Not on file  Tobacco Use  . Smoking status: Never Smoker  . Smokeless tobacco: Never Used  Vaping Use  . Vaping Use: Never used  Substance and Sexual Activity  . Alcohol use: Yes    Comment: occasional  . Drug use: No  . Sexual activity: Not on file  Other Topics Concern  . Not on file  Social History Narrative  . Not on file   Social Determinants of Health   Financial Resource Strain:   . Difficulty of Paying Living Expenses:   Food Insecurity:   . Worried About Programme researcher, broadcasting/film/video in the Last Year:   . Barista in the Last Year:   Transportation Needs:   . Freight forwarder (Medical):   Marland Kitchen Lack of Transportation (Non-Medical):   Physical Activity:   . Days of Exercise per Week:   . Minutes of Exercise per Session:   Stress:   . Feeling of Stress :   Social Connections:   . Frequency of Communication with Friends and Family:   . Frequency of Social Gatherings with Friends and Family:   . Attends Religious Services:   . Active Member of Clubs or Organizations:   . Attends Banker Meetings:   Marland Kitchen Marital Status:   Intimate Partner Violence:   . Fear of Current or Ex-Partner:   . Emotionally Abused:   Marland Kitchen Physically Abused:   . Sexually Abused:     Family History  Problem Relation Age of Onset  . Cancer Sister   . Stroke Other     BP (!) 171/111   Pulse (!) 109   Ht 6' 1.5" (1.867 m)   Wt 233 lb (105.7 kg)   BMI 30.32 kg/m   Body mass index is 30.32 kg/m.     Objective:   Physical Exam Vitals and nursing note reviewed.  Constitutional:       Appearance: He is well-developed.  HENT:     Head: Normocephalic and atraumatic.  Eyes:     Conjunctiva/sclera: Conjunctivae normal.     Pupils: Pupils are equal, round, and reactive to light.  Cardiovascular:     Rate and Rhythm: Normal rate and regular rhythm.  Pulmonary:     Effort: Pulmonary effort is normal.  Abdominal:     Palpations: Abdomen is soft.  Musculoskeletal:       Arms:     Cervical back: Normal range of motion and neck supple.  Skin:    General: Skin is warm and dry.  Neurological:     Mental Status: He is alert and oriented to person, place, and time.     Cranial Nerves: No cranial nerve deficit.     Motor: No abnormal muscle tone.     Coordination: Coordination normal.     Deep Tendon Reflexes: Reflexes are normal and symmetric. Reflexes normal.  Psychiatric:        Behavior: Behavior normal.        Thought Content: Thought content normal.        Judgment: Judgment normal.   X-rays were done of the lumbar spine, reported separately.        Assessment & Plan:   Encounter Diagnosis  Name Primary?  . Chronic midline low back pain with right-sided sciatica Yes   I will have him see the neurosurgeon who put in the stimulator.  I do not know if he can have a MRI.    I will call in pain medicine  I will call in prednisone dose pack.  Call if any problem.  Precautions discussed.   Electronically Signed Darreld Mclean, MD 7/20/20212:22 PM

## 2019-09-04 ENCOUNTER — Telehealth: Payer: Self-pay | Admitting: Orthopaedic Surgery

## 2019-09-04 NOTE — Telephone Encounter (Signed)
Mr. Betten called today stating that he called the spine stimulator company.  He said he had a tech pull up his information and unfortunately when he got a new stimulator, they kept his old leads from the previous one.  He said those are from 2006.  He is unable to have an MRI done and would need to have a CT done.  He said if we needed to call the company back the phone number is [phone]  He asked that I pass along this information to you

## 2019-09-07 ENCOUNTER — Telehealth: Payer: Self-pay | Admitting: Orthopaedic Surgery

## 2019-09-07 NOTE — Telephone Encounter (Signed)
Trevor Rollins wants to know where he stands on getting his test done.  He wants someone to call him ASAP regarding this.  Please call him and let him know about authorization or whatever the hold up is  Thanks

## 2019-09-14 ENCOUNTER — Telehealth: Payer: Self-pay

## 2019-09-14 NOTE — Telephone Encounter (Signed)
Hydrocodone-Acetaminophen 7.5/325mg   Qty 30 Tablets  Also asking for something for muscle spasms.  PATIENT USES [facility]

## 2019-09-14 NOTE — Telephone Encounter (Signed)
Patient called stating he found out that he could not have a MRI but that he can have CT Scan. He wants to know if you need to see him again here in the office before ordering the scan or not.   Please advise

## 2019-09-18 MED ORDER — HYDROCODONE-ACETAMINOPHEN 5-325 MG PO TABS: ORAL_TABLET | ORAL | 0 refills | Status: DC

## 2019-09-18 NOTE — Telephone Encounter (Signed)
Sure

## 2019-09-19 ENCOUNTER — Other Ambulatory Visit: Payer: Self-pay

## 2019-09-19 ENCOUNTER — Encounter: Payer: Self-pay | Admitting: Orthopaedic Surgery

## 2019-09-19 ENCOUNTER — Ambulatory Visit: Payer: BC Managed Care – PPO | Admitting: Orthopaedic Surgery

## 2019-09-19 VITALS — BP 135/90 | HR 93 | Ht 73.5 in | Wt 239.0 lb

## 2019-09-19 DIAGNOSIS — M5441 Lumbago with sciatica, right side: Secondary | ICD-10-CM

## 2019-09-19 DIAGNOSIS — G8929 Other chronic pain: Secondary | ICD-10-CM | POA: Diagnosis not present

## 2019-09-19 MED ORDER — METHOCARBAMOL 500 MG PO TABS: 500.0000 mg | ORAL_TABLET | Freq: Four times a day (QID) | ORAL | 0 refills | Status: DC | PRN

## 2019-09-19 MED ORDER — PREDNISONE 5 MG (21) PO TBPK
ORAL_TABLET | ORAL | 0 refills | Status: DC
Start: 2019-09-19 — End: 2019-11-01

## 2019-09-19 NOTE — Progress Notes (Signed)
Patient Trevor Rollins, male DOB:1970-08-05, 49 y.o. HAL:[id number]  Chief Complaint  Patient presents with  . Back Pain    Patient was told he was not able to get an MRI    HPI  Trevor Rollins is a 49 y.o. male who continues to have pain in the lower back that is getting worse.  We had a problem in communication.  I had wanted him to go to the neurosurgeon to be seen for myelogram/CT if indicated.  He called and found out that he could not have a MRI as his pain stimulator electrodes are not of the material that can have a MRI.  He talked to the secretary of the neurosurgeon he goes to.  The doctor was on vacation.  She relayed that she did not understand why CT myelogram was considered.  No study was done.  I told him that most people are able to get MRI but he cannot.  He needs CT and maybe the myelogram to enhance the study.  I will arrange this in [place].  He has been provided number to call also. He will need to get insurance OK first.  I have called in his muscle relaxant earlier today and will call in prednisone dose pack which helps.  I am concerned about HNP or nerve root irritation.  He is to call me if there is a problem in getting this study done.  I will see him next week.  He still continues in pain and is not doing well.   Body mass index is 31.1 kg/m.  ROS  Review of Systems  Constitutional: Positive for activity change.  Musculoskeletal: Positive for arthralgias, back pain and myalgias.  Allergic/Immunologic: Positive for environmental allergies.  All other systems reviewed and are negative.   All other systems reviewed and are negative.  The following is a summary of the past history medically, past history surgically, known current medicines, social history and family history.  This information is gathered electronically by the computer from prior information and documentation.  I review this each visit and have found including this information at this point in  the chart is beneficial and informative.    Past Medical History:  Diagnosis Date  . Allergy   . Anxiety   . Asthma   . DDD (degenerative disc disease)    pt denies  . Deafness in left ear   . ED (erectile dysfunction)   . Hyperlipidemia   . Hypertension   . Hypertriglyceridemia   . Insomnia     Past Surgical History:  Procedure Laterality Date  . ORIF ANKLE FRACTURE Right 03/01/2014   Procedure: OPEN REDUCTION INTERNAL FIXATION (ORIF) RIGHT ANKLE FRACTURE;  Surgeon: Cheral Almas, MD;  Location: [facility] OR;  Service: Orthopedics;  Laterality: Right;  . SPINAL CORD STIMULATOR BATTERY EXCHANGE N/A 12/24/2016   Procedure: Replacement of implantable pulse generator,;  Surgeon: Odette Fraction, MD;  Location: [facility] OR;  Service: Neurosurgery;  Laterality: N/A;  Replacement of implantable pulse generator, possible replacement of leads on spinal cord stimulator  . SPINAL CORD STIMULATOR IMPLANT    . TONSILLECTOMY    . WISDOM TOOTH EXTRACTION      Family History  Problem Relation Age of Onset  . Cancer Sister   . Stroke Other     Social History Social History   Tobacco Use  . Smoking status: Never Smoker  . Smokeless tobacco: Never Used  Vaping Use  . Vaping Use: Never used  Substance Use  Topics  . Alcohol use: Yes    Comment: occasional  . Drug use: No    Allergies  Allergen Reactions  . Penicillins Other (See Comments)    Childhood reaction "redness"  Has patient had a PCN reaction causing immediate rash, facial/tongue/throat swelling, SOB or lightheadedness with hypotension: No Has patient had a PCN reaction causing severe rash involving mucus membranes or skin necrosis: No Has patient had a PCN reaction that required hospitalization: No Has patient had a PCN reaction occurring within the last 10 years: No If all of the above answers are "NO", then may proceed with Cephalosporin use.     Current Outpatient Medications  Medication Sig Dispense Refill  . albuterol  (VENTOLIN HFA) 108 (90 Base) MCG/ACT inhaler INHALE 4 PUFFS INTO THE LUNGS EVERY 4-6 HOURS AS NEEDED FOR UP TO 30 DAYS. 18 g 2  . citalopram (CELEXA) 20 MG tablet Take 1 tablet (20 mg total) by mouth daily. 90 tablet 1  . diazepam (VALIUM) 5 MG tablet Take 1 tablet (5 mg total) by mouth at bedtime as needed. For insomnia or muscle spasms 30 tablet 1  . diclofenac (VOLTAREN) 75 MG EC tablet Take 1 tablet (75 mg total) by mouth 2 (two) times daily. 30 tablet 0  . diphenhydrAMINE (BENADRYL ALLERGY) 25 MG tablet Take 25 mg by mouth every 6 (six) hours as needed.    . enalapril (VASOTEC) 10 MG tablet Take 1 tablet (10 mg total) by mouth daily. 90 tablet 1  . fluticasone (FLONASE) 50 MCG/ACT nasal spray Place 2 sprays into both nostrils daily. 16 g 5  . fluticasone (FLOVENT HFA) 110 MCG/ACT inhaler Inhale 2 puffs into the lungs 2 (two) times daily. 1 Inhaler 5  . HYDROcodone-acetaminophen (NORCO/VICODIN) 5-325 MG tablet One tablet every six hours for pain.  Limit 7 days. 28 tablet 0  . methocarbamol (ROBAXIN) 500 MG tablet Take 1 tablet (500 mg total) by mouth every 6 (six) hours as needed for muscle spasms. 60 tablet 0  . predniSONE (STERAPRED UNI-PAK 21 TAB) 5 MG (21) TBPK tablet Take 6 pills first day; 5 pills second day; 4 pills third day; 3 pills fourth day; 2 pills next day and 1 pill last day. 21 tablet 0   No current facility-administered medications for this visit.     Physical Exam  Blood pressure 135/90, pulse 93, height 6' 1.5" (1.867 m), weight 239 lb (108.4 kg).  Constitutional: overall normal hygiene, normal nutrition, well developed, normal grooming, normal body habitus. Assistive device:none  Musculoskeletal: gait and station Limp right slightly, muscle tone and strength are normal, no tremors or atrophy is present.  .  Neurological: coordination overall normal.  Deep tendon reflex/nerve stretch intact.  Sensation normal.  Cranial nerves II-XII intact.   Skin:   Normal overall  no scars, lesions, ulcers or rashes. No psoriasis.  Psychiatric: Alert and oriented x 3.  Recent memory intact, remote memory unclear.  Normal mood and affect. Well groomed.  Good eye contact.  Cardiovascular: overall no swelling, no varicosities, no edema bilaterally, normal temperatures of the legs and arms, no clubbing, cyanosis and good capillary refill.  His lower back is painful, limited ROM, no spasm, no weakness, just in pain.  I did not have him go through a full ROM activity as he is in so much pain.   Lymphatic: palpation is normal.  All other systems reviewed and are negative   The patient has been educated about the nature of the problem(s) and  counseled on treatment options.  The patient appeared to understand what I have discussed and is in agreement with it.  Encounter Diagnosis  Name Primary?  . Chronic midline low back pain with right-sided sciatica Yes    PLAN Call if any problems.  Precautions discussed.  Continue current medications. I called in prednisone dose pack.  Return to clinic 1 week   Get CT scan, possible myelogram.  Electronically Signed Darreld Mclean, MD 8/10/20212:58 PM

## 2019-09-19 NOTE — Telephone Encounter (Signed)
Patient is asking for prescription for muscle spasms.  PATIENT USES [facility]

## 2019-09-20 ENCOUNTER — Telehealth: Payer: Self-pay

## 2019-09-20 NOTE — Telephone Encounter (Signed)
Spoke with patient to review his medications before being scheduled for a lumbar myelogram.  He stated an understanding that he will be here two hours, needs a driver and will need to be on strict bedrest (explained) for 24 hours after the procedure.  He also stated an understanding he needs to hold Citalopram (Celexa) for 48 hours before, and 24 hours after, the myelogram.

## 2019-09-25 ENCOUNTER — Ambulatory Visit
Admission: RE | Admit: 2019-09-25 | Discharge: 2019-09-25 | Disposition: A | Discharge status: Home / Self Care | Payer: BC Managed Care – PPO | Source: Ambulatory Visit | Attending: Orthopaedic Surgery | Admitting: Orthopaedic Surgery

## 2019-09-25 DIAGNOSIS — M5441 Lumbago with sciatica, right side: Secondary | ICD-10-CM

## 2019-09-25 DIAGNOSIS — G8929 Other chronic pain: Secondary | ICD-10-CM

## 2019-09-25 MED ORDER — IOPAMIDOL (ISOVUE-M 200) INJECTION 41%
18.0000 mL | Freq: Once | INTRAMUSCULAR | Status: AC
  Administered 2019-09-25: 18 mL via INTRATHECAL

## 2019-09-25 MED ORDER — DIAZEPAM 5 MG PO TABS
10.0000 mg | ORAL_TABLET | Freq: Once | ORAL | Status: AC
  Administered 2019-09-25: 10 mg via ORAL

## 2019-09-25 NOTE — Discharge Instructions (Signed)
Myelogram Discharge Instructions  1. Go home and rest quietly for the next 24 hours.  It is important to lie flat for the next 24 hours.  Get up only to go to the restroom.  You may lie in the bed or on a couch on your back, your stomach, your left side or your right side.  You may have one pillow under your head.  You may have pillows between your knees while you are on your side or under your knees while you are on your back.  2. DO NOT drive today.  Recline the seat as far back as it will go, while still wearing your seat belt, on the way home.  3. You may get up to go to the bathroom as needed.  You may sit up for 10 minutes to eat.  You may resume your normal diet and medications unless otherwise indicated.  Drink lots of extra fluids today and tomorrow.  4. The incidence of headache, nausea, or vomiting is about 5% (one in 20 patients).  If you develop a headache, lie flat and drink plenty of fluids until the headache goes away.  Caffeinated beverages may be helpful.  If you develop severe nausea and vomiting or a headache that does not go away with flat bed rest, call [phone].  5. You may resume normal activities after your 24 hours of bed rest is over; however, do not exert yourself strongly or do any heavy lifting tomorrow. If when you get up you have a headache when standing, go back to bed and force fluids for another 24 hours.  6. Call your physician for a follow-up appointment.  The results of your myelogram will be sent directly to your physician by the following day.  7. If you have any questions or if complications develop after you arrive home, please call [phone].  Discharge instructions have been explained to the patient.  The patient, or the person responsible for the patient, fully understands these instructions.  YOU MAY RESTART YOUR CELEXA TOMORROW 09/26/19 AT 10:30AM.

## 2019-09-26 ENCOUNTER — Ambulatory Visit: Payer: BC Managed Care – PPO | Admitting: Orthopaedic Surgery

## 2019-09-26 ENCOUNTER — Other Ambulatory Visit: Payer: Self-pay

## 2019-09-26 DIAGNOSIS — G8929 Other chronic pain: Secondary | ICD-10-CM

## 2019-09-26 NOTE — Progress Notes (Signed)
Pt came to office after speaking with Dr. Lovell Sheehan nurse at [facility]. Pt requests that records be sent back to them for an urgent appointment. Referral ordered and will be sent in [facility] urgently.

## 2019-09-28 ENCOUNTER — Ambulatory Visit: Payer: BC Managed Care – PPO | Admitting: Orthopaedic Surgery

## 2019-10-27 ENCOUNTER — Ambulatory Visit
Admission: EM | Admit: 2019-10-27 | Discharge: 2019-10-27 | Disposition: A | Discharge status: Home / Self Care | Payer: BC Managed Care – PPO | Attending: Emergency Medicine | Admitting: Emergency Medicine

## 2019-10-27 DIAGNOSIS — R112 Nausea with vomiting, unspecified: Secondary | ICD-10-CM | POA: Diagnosis not present

## 2019-10-27 DIAGNOSIS — I959 Hypotension, unspecified: Secondary | ICD-10-CM | POA: Diagnosis not present

## 2019-10-27 DIAGNOSIS — Z1152 Encounter for screening for COVID-19: Secondary | ICD-10-CM

## 2019-10-27 MED ORDER — ONDANSETRON 4 MG PO TBDP: 4.0000 mg | ORAL_TABLET | Freq: Three times a day (TID) | ORAL | 0 refills | Status: DC | PRN

## 2019-10-27 NOTE — ED Provider Notes (Signed)
[facility]    CSN: [id number] Arrival date & time: 10/27/19  1148      History   Chief Complaint Chief Complaint  Patient presents with  . Nasal Congestion  . Headache  . Emesis    HPI Trevor Rollins is a 49 y.o. male.   Pt here for n/v not able to keep foods down. States he was ok yesterday and today woke up sick has had the vaccine. He felt light headed and sick. Denies any chest pain, has had maderna vaccine. Alert x4 now states he drank 2 bottles   Of water while he was here and feels better. No sob,       Past Medical History:  Diagnosis Date  . Allergy   . Anxiety   . Asthma   . DDD (degenerative disc disease)    pt denies  . Deafness in left ear   . ED (erectile dysfunction)   . Hyperlipidemia   . Hypertension   . Hypertriglyceridemia   . Insomnia     Patient Active Problem List   Diagnosis Date Noted  . RSD (reflex sympathetic dystrophy) 08/09/2019  . 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 Allergic rhinoconjunctivitis   . Trimalleolar fracture of right ankle 03/01/2014  . Essential hypertension, benign 09/08/2012  . Other and unspecified hyperlipidemia 09/08/2012  . Asthma, chronic 09/08/2012  . Generalized anxiety disorder 09/08/2012  . Erectile dysfunction 09/08/2012  . Low serum testosterone level 09/08/2012  . Allergic rhinitis 08/02/2012    Past Surgical History:  Procedure Laterality Date  . ORIF ANKLE FRACTURE Right 03/01/2014   Procedure: OPEN REDUCTION INTERNAL FIXATION (ORIF) RIGHT ANKLE FRACTURE;  Surgeon: Cheral Almas, MD;  Location: [facility] OR;  Service: Orthopedics;  Laterality: Right;  . SPINAL CORD STIMULATOR BATTERY EXCHANGE N/A 12/24/2016   Procedure: Replacement of implantable pulse generator,;  Surgeon: Odette Fraction, MD;  [facility];  Service: Neurosurgery;  Laterality: N/A;  Replacement of implantable pulse generator, possible replacement of leads on spinal cord stimulator  . SPINAL CORD  STIMULATOR IMPLANT    . TONSILLECTOMY    . WISDOM TOOTH EXTRACTION         Home Medications    Prior to Admission medications   Medication Sig Start Date End Date Taking? Authorizing Provider  albuterol (VENTOLIN HFA) 108 (90 Base) MCG/ACT inhaler INHALE 4 PUFFS INTO THE LUNGS EVERY 4-6 HOURS AS NEEDED FOR UP TO 30 DAYS. 04/27/19   Alfonse Spruce, MD  citalopram (CELEXA) 20 MG tablet Take 1 tablet (20 mg total) by mouth daily. 05/04/19   Merlyn Albert, MD  diazepam (VALIUM) 5 MG tablet Take 1 tablet (5 mg total) by mouth at bedtime as needed. For insomnia or muscle spasms 05/04/19   Merlyn Albert, MD  diphenhydrAMINE (BENADRYL ALLERGY) 25 MG tablet Take 25 mg by mouth every 6 (six) hours as needed.    [provider]  enalapril (VASOTEC) 10 MG tablet Take 1 tablet (10 mg total) by mouth daily. 05/04/19   Merlyn Albert, MD  fluticasone (FLONASE) 50 MCG/ACT nasal spray Place 2 sprays into both nostrils daily. 04/12/19   Merlyn Albert, MD  fluticasone (FLOVENT HFA) 110 MCG/ACT inhaler Inhale 2 puffs into the lungs 2 (two) times daily. 04/27/19   Alfonse Spruce, MD  HYDROcodone-acetaminophen (NORCO/VICODIN) 5-325 MG tablet One tablet every six hours for pain.  Limit 7 days. 09/18/19   Darreld Mclean, MD  methocarbamol (ROBAXIN) 500 MG tablet  Take 1 tablet (500 mg total) by mouth every 6 (six) hours as needed for muscle spasms. 09/19/19   Darreld Mclean, MD  ondansetron (ZOFRAN ODT) 4 MG disintegrating tablet Take 1 tablet (4 mg total) by mouth every 8 (eight) hours as needed for nausea or vomiting. 10/27/19   Coralyn Mark, NP  predniSONE (STERAPRED UNI-PAK 21 TAB) 5 MG (21) TBPK tablet Take 6 pills first day; 5 pills second day; 4 pills third day; 3 pills fourth day; 2 pills next day and 1 pill last day. Patient not taking: Reported on 09/20/2019 09/19/19   Darreld Mclean, MD    Family History Family History  Problem Relation Age of Onset  . Cancer Sister    . Stroke Other     Social History Social History   Tobacco Use  . Smoking status: Never Smoker  . Smokeless tobacco: Never Used  Vaping Use  . Vaping Use: Never used  Substance Use Topics  . Alcohol use: Yes    Comment: occasional  . Drug use: No     Allergies   Penicillins   Review of Systems Review of Systems  Constitutional: Positive for fatigue.  Eyes: Negative.   Respiratory: Negative.   Cardiovascular: Negative.   Gastrointestinal: Positive for nausea and vomiting.  Genitourinary: Negative.   Skin: Negative.   Neurological: Positive for dizziness and headaches.       Intermit dizziness and headache none at this time states he felt  This more with the emesis this morning      Physical Exam Triage Vital Signs ED Triage Vitals  Enc Vitals Group     BP 10/27/19 1208 (!) 84/62     Pulse Rate 10/27/19 1208 (!) 105     Resp 10/27/19 1208 20     Temp 10/27/19 1208 98 F (36.7 C)     Temp src --      SpO2 10/27/19 1208 98 %     Weight --      Height --      Head Circumference --      Peak Flow --      Pain Score 10/27/19 1206 0     Pain Loc --      Pain Edu? --      Excl. in GC? --    No data found.  Updated Vital Signs BP (!) 84/62   Pulse (!) 105   Temp 98 F (36.7 C)   Resp 20   SpO2 98%   Visual Acuity     Physical Exam Constitutional:      Appearance: He is well-developed.  Cardiovascular:     Rate and Rhythm: Normal rate.  Pulmonary:     Effort: Pulmonary effort is normal.  Abdominal:     General: Bowel sounds are normal.     Palpations: Abdomen is soft.  Musculoskeletal:        General: Normal range of motion.  Skin:    General: Skin is warm and dry.  Neurological:     Mental Status: He is alert.     GCS: GCS eye subscore is 4. GCS verbal subscore is 5. GCS motor subscore is 6.      UC Treatments / Results  Labs (all labs ordered are listed, but only abnormal results are displayed) Labs Reviewed  NOVEL CORONAVIRUS, NAA     EKG   Radiology No results found.  Procedures Procedures (including critical care time)  Medications Ordered in UC Medications - No data  to display  Initial Impression / Assessment and Plan / UC Course  I have reviewed the triage vital signs and the nursing notes.  Pertinent labs & imaging results that were available during my care of the patient were reviewed by me and considered in my medical decision making (see chart for details).    We discussed the causes for hypotension I would like pt to go to the er if he is not able to keep fluids down. He will take nausea meds try to push fluids with electrolytes . If not better will go to the er.  He has a monitor at home to use  He was able to keep 2 bottles of water down while here. Walked around room and felt better   Final Clinical Impressions(s) / UC Diagnoses   Final diagnoses:  Hypotension, unspecified hypotension type  Nausea and vomiting, intractability of vomiting not specified, unspecified vomiting type     Discharge Instructions     We  discussed pt is to go to the er if symptoms continue  Pt will need further work up and fluids He will go home and try nausea medications and stay hydrated  If any shortness of breath or chest pain will go to the er  Covid test will come back in 24-48 hours chart my chart for results     ED Prescriptions    Medication Sig Dispense Auth. Provider   ondansetron (ZOFRAN ODT) 4 MG disintegrating tablet Take 1 tablet (4 mg total) by mouth every 8 (eight) hours as needed for nausea or vomiting. 20 tablet Coralyn Mark, NP     PDMP not reviewed this encounter.   Coralyn Mark, NP 10/27/19 1356

## 2019-10-27 NOTE — Discharge Instructions (Addendum)
We  discussed pt is to go to the er if symptoms continue  Pt will need further work up and fluids He will go home and try nausea medications and stay hydrated  If any shortness of breath or chest pain will go to the er  Covid test will come back in 24-48 hours chart my chart for results

## 2019-10-28 ENCOUNTER — Other Ambulatory Visit: Payer: Self-pay

## 2019-10-28 ENCOUNTER — Emergency Department (HOSPITAL_COMMUNITY): Payer: BC Managed Care – PPO

## 2019-10-28 ENCOUNTER — Inpatient Hospital Stay (HOSPITAL_COMMUNITY): Payer: BC Managed Care – PPO

## 2019-10-28 ENCOUNTER — Inpatient Hospital Stay (HOSPITAL_COMMUNITY)
Admission: EM | Admit: 2019-10-28 | Discharge: 2019-11-01 | DRG: 640 | Disposition: A | Discharge status: Home / Self Care | Payer: BC Managed Care – PPO | Attending: Internal Medicine | Admitting: Internal Medicine

## 2019-10-28 DIAGNOSIS — R197 Diarrhea, unspecified: Secondary | ICD-10-CM

## 2019-10-28 DIAGNOSIS — E669 Obesity, unspecified: Secondary | ICD-10-CM | POA: Diagnosis present

## 2019-10-28 DIAGNOSIS — Z23 Encounter for immunization: Secondary | ICD-10-CM | POA: Diagnosis not present

## 2019-10-28 DIAGNOSIS — R778 Other specified abnormalities of plasma proteins: Secondary | ICD-10-CM | POA: Diagnosis present

## 2019-10-28 DIAGNOSIS — E8729 Other acidosis: Secondary | ICD-10-CM | POA: Diagnosis present

## 2019-10-28 DIAGNOSIS — R579 Shock, unspecified: Secondary | ICD-10-CM | POA: Diagnosis present

## 2019-10-28 DIAGNOSIS — M879 Osteonecrosis, unspecified: Secondary | ICD-10-CM | POA: Diagnosis present

## 2019-10-28 DIAGNOSIS — R112 Nausea with vomiting, unspecified: Secondary | ICD-10-CM | POA: Diagnosis present

## 2019-10-28 DIAGNOSIS — Z7951 Long term (current) use of inhaled steroids: Secondary | ICD-10-CM

## 2019-10-28 DIAGNOSIS — R739 Hyperglycemia, unspecified: Secondary | ICD-10-CM | POA: Diagnosis not present

## 2019-10-28 DIAGNOSIS — R7989 Other specified abnormal findings of blood chemistry: Secondary | ICD-10-CM | POA: Diagnosis not present

## 2019-10-28 DIAGNOSIS — E860 Dehydration: Principal | ICD-10-CM | POA: Diagnosis present

## 2019-10-28 DIAGNOSIS — I519 Heart disease, unspecified: Secondary | ICD-10-CM | POA: Diagnosis not present

## 2019-10-28 DIAGNOSIS — Z880 Allergy status to penicillin: Secondary | ICD-10-CM

## 2019-10-28 DIAGNOSIS — K7200 Acute and subacute hepatic failure without coma: Secondary | ICD-10-CM | POA: Diagnosis present

## 2019-10-28 DIAGNOSIS — R571 Hypovolemic shock: Secondary | ICD-10-CM | POA: Diagnosis present

## 2019-10-28 DIAGNOSIS — G8929 Other chronic pain: Secondary | ICD-10-CM | POA: Diagnosis present

## 2019-10-28 DIAGNOSIS — F419 Anxiety disorder, unspecified: Secondary | ICD-10-CM | POA: Diagnosis present

## 2019-10-28 DIAGNOSIS — Z79899 Other long term (current) drug therapy: Secondary | ICD-10-CM

## 2019-10-28 DIAGNOSIS — Z20822 Contact with and (suspected) exposure to covid-19: Secondary | ICD-10-CM | POA: Diagnosis present

## 2019-10-28 DIAGNOSIS — I255 Ischemic cardiomyopathy: Secondary | ICD-10-CM | POA: Diagnosis not present

## 2019-10-28 DIAGNOSIS — T380X5A Adverse effect of glucocorticoids and synthetic analogues, initial encounter: Secondary | ICD-10-CM | POA: Diagnosis not present

## 2019-10-28 DIAGNOSIS — Z9682 Presence of neurostimulator: Secondary | ICD-10-CM

## 2019-10-28 DIAGNOSIS — G4700 Insomnia, unspecified: Secondary | ICD-10-CM | POA: Diagnosis present

## 2019-10-28 DIAGNOSIS — E872 Acidosis, unspecified: Secondary | ICD-10-CM | POA: Diagnosis present

## 2019-10-28 DIAGNOSIS — I248 Other forms of acute ischemic heart disease: Secondary | ICD-10-CM | POA: Diagnosis present

## 2019-10-28 DIAGNOSIS — I110 Hypertensive heart disease with heart failure: Secondary | ICD-10-CM | POA: Diagnosis present

## 2019-10-28 DIAGNOSIS — Z6833 Body mass index (BMI) 33.0-33.9, adult: Secondary | ICD-10-CM | POA: Diagnosis not present

## 2019-10-28 DIAGNOSIS — M5431 Sciatica, right side: Secondary | ICD-10-CM | POA: Diagnosis present

## 2019-10-28 DIAGNOSIS — I5021 Acute systolic (congestive) heart failure: Secondary | ICD-10-CM | POA: Diagnosis present

## 2019-10-28 DIAGNOSIS — E785 Hyperlipidemia, unspecified: Secondary | ICD-10-CM | POA: Diagnosis present

## 2019-10-28 DIAGNOSIS — H9192 Unspecified hearing loss, left ear: Secondary | ICD-10-CM | POA: Diagnosis present

## 2019-10-28 DIAGNOSIS — N179 Acute kidney failure, unspecified: Secondary | ICD-10-CM

## 2019-10-28 DIAGNOSIS — E871 Hypo-osmolality and hyponatremia: Secondary | ICD-10-CM | POA: Diagnosis present

## 2019-10-28 LAB — COMPREHENSIVE METABOLIC PANEL
ALT: 60 U/L — ABNORMAL HIGH (ref 0–44)
ALT: 61 U/L — ABNORMAL HIGH (ref 0–44)
AST: 87 U/L — ABNORMAL HIGH (ref 15–41)
AST: 89 U/L — ABNORMAL HIGH (ref 15–41)
Albumin: 2.8 g/dL — ABNORMAL LOW (ref 3.5–5.0)
Albumin: 2.8 g/dL — ABNORMAL LOW (ref 3.5–5.0)
Alkaline Phosphatase: 54 U/L (ref 38–126)
Alkaline Phosphatase: 46 U/L (ref 38–126)
Anion gap: 12 (ref 5–15)
Anion gap: 12 (ref 5–15)
BUN: 16 mg/dL (ref 6–20)
BUN: 20 mg/dL (ref 6–20)
CO2: 18 mmol/L — ABNORMAL LOW (ref 22–32)
CO2: 17 mmol/L — ABNORMAL LOW (ref 22–32)
Calcium: 8.2 mg/dL — ABNORMAL LOW (ref 8.9–10.3)
Calcium: 7.7 mg/dL — ABNORMAL LOW (ref 8.9–10.3)
Chloride: 99 mmol/L (ref 98–111)
Chloride: 100 mmol/L (ref 98–111)
Creatinine, Ser: 1.47 mg/dL — ABNORMAL HIGH (ref 0.61–1.24)
Creatinine, Ser: 1.66 mg/dL — ABNORMAL HIGH (ref 0.61–1.24)
GFR calc Af Amer: >60 mL/min (ref >60)
GFR calc Af Amer: 55 mL/min — ABNORMAL LOW (ref >60)
GFR calc non Af Amer: 55 mL/min — ABNORMAL LOW (ref >60)
GFR calc non Af Amer: 47 mL/min — ABNORMAL LOW (ref >60)
Glucose, Bld: 181 mg/dL — ABNORMAL HIGH (ref 70–99)
Glucose, Bld: 174 mg/dL — ABNORMAL HIGH (ref 70–99)
Potassium: 3.7 mmol/L (ref 3.5–5.1)
Potassium: 4.9 mmol/L (ref 3.5–5.1)
Sodium: 129 mmol/L — ABNORMAL LOW (ref 135–145)
Sodium: 129 mmol/L — ABNORMAL LOW (ref 135–145)
Total Bilirubin: 1.4 mg/dL — ABNORMAL HIGH (ref 0.3–1.2)
Total Bilirubin: 1.7 mg/dL — ABNORMAL HIGH (ref 0.3–1.2)
Total Protein: 5.6 g/dL — ABNORMAL LOW (ref 6.5–8.1)
Total Protein: 5.6 g/dL — ABNORMAL LOW (ref 6.5–8.1)

## 2019-10-28 LAB — CBC WITH DIFFERENTIAL/PLATELET
Abs Immature Granulocytes: 0 10*3/uL (ref 0.00–0.07)
Band Neutrophils: 0 %
Basophils Absolute: 0 10*3/uL (ref 0.0–0.1)
Basophils Relative: 0 %
Blasts: 0 %
Eosinophils Absolute: 0 10*3/uL (ref 0.0–0.5)
Eosinophils Relative: 0 %
HCT: 37.9 % — ABNORMAL LOW (ref 39.0–52.0)
Hemoglobin: 12.2 g/dL — ABNORMAL LOW (ref 13.0–17.0)
Lymphocytes Relative: 0 %
Lymphs Abs: 0 10*3/uL — ABNORMAL LOW (ref 0.7–4.0)
MCH: 32.1 pg (ref 26.0–34.0)
MCHC: 32.2 g/dL (ref 30.0–36.0)
MCV: 99.7 fL (ref 80.0–100.0)
Metamyelocytes Relative: 0 %
Monocytes Absolute: 0.2 10*3/uL (ref 0.1–1.0)
Monocytes Relative: 1 %
Myelocytes: 0 %
Neutro Abs: 15.2 10*3/uL — ABNORMAL HIGH (ref 1.7–7.7)
Neutrophils Relative %: 99 %
Other: 0 %
Platelets: 148 10*3/uL — ABNORMAL LOW (ref 150–400)
Promyelocytes Relative: 0 %
RBC: 3.8 MIL/uL — ABNORMAL LOW (ref 4.22–5.81)
RDW: 13.2 % (ref 11.5–15.5)
WBC Morphology: INCREASED
WBC: 15.4 10*3/uL — ABNORMAL HIGH (ref 4.0–10.5)
nRBC: 0 % (ref 0.0–0.2)
nRBC: 0 /100 WBC

## 2019-10-28 LAB — CBC
HCT: 37 % — ABNORMAL LOW (ref 39.0–52.0)
Hemoglobin: 11.9 g/dL — ABNORMAL LOW (ref 13.0–17.0)
MCH: 32 pg (ref 26.0–34.0)
MCHC: 32.2 g/dL (ref 30.0–36.0)
MCV: 99.5 fL (ref 80.0–100.0)
Platelets: UNDETERMINED 10*3/uL (ref 150–400)
RBC: 3.72 MIL/uL — ABNORMAL LOW (ref 4.22–5.81)
RDW: 13.1 % (ref 11.5–15.5)
WBC: 9.3 10*3/uL (ref 4.0–10.5)
nRBC: 0 % (ref 0.0–0.2)

## 2019-10-28 LAB — PROCALCITONIN: Procalcitonin: 28.54 ng/mL

## 2019-10-28 LAB — PHOSPHORUS: Phosphorus: 1.8 mg/dL — ABNORMAL LOW (ref 2.5–4.6)

## 2019-10-28 LAB — ECHOCARDIOGRAM COMPLETE
AR max vel: 4.01 cm2
AV Area VTI: 4.05 cm2
AV Area mean vel: 3.77 cm2
AV Mean grad: 3 mmHg
AV Peak grad: 4.3 mmHg
Ao pk vel: 1.04 m/s
Area-P 1/2: 5.66 cm2
Calc EF: 46.2 %
Height: 73 in
S' Lateral: 3.2 cm
Single Plane A2C EF: 45.4 %
Single Plane A4C EF: 41.6 %
Weight: 3936 oz

## 2019-10-28 LAB — TROPONIN I (HIGH SENSITIVITY)
Troponin I (High Sensitivity): 208 ng/L (ref <18)
Troponin I (High Sensitivity): 230 ng/L (ref <18)

## 2019-10-28 LAB — HIV ANTIBODY (ROUTINE TESTING W REFLEX): HIV Screen 4th Generation wRfx: NONREACTIVE

## 2019-10-28 LAB — CBG MONITORING, ED: Glucose-Capillary: 158 mg/dL — ABNORMAL HIGH (ref 70–99)

## 2019-10-28 LAB — LIPASE, BLOOD: Lipase: 64 U/L — ABNORMAL HIGH (ref 11–51)

## 2019-10-28 LAB — SARS CORONAVIRUS 2 BY RT PCR (HOSPITAL ORDER, PERFORMED IN ~~LOC~~ HOSPITAL LAB): SARS Coronavirus 2: NEGATIVE

## 2019-10-28 LAB — MRSA PCR SCREENING: MRSA by PCR: NEGATIVE

## 2019-10-28 LAB — LACTIC ACID, PLASMA
Lactic Acid, Venous: 4.3 mmol/L (ref 0.5–1.9)
Lactic Acid, Venous: 3.1 mmol/L (ref 0.5–1.9)

## 2019-10-28 LAB — MAGNESIUM: Magnesium: 1.4 mg/dL — ABNORMAL LOW (ref 1.7–2.4)

## 2019-10-28 LAB — AMYLASE: Amylase: 80 U/L (ref 28–100)

## 2019-10-28 LAB — CORTISOL: Cortisol, Plasma: 42.3 ug/dL

## 2019-10-28 LAB — BRAIN NATRIURETIC PEPTIDE: B Natriuretic Peptide: 568.1 pg/mL — ABNORMAL HIGH (ref 0.0–100.0)

## 2019-10-28 MED ORDER — SODIUM CHLORIDE 0.9 % IV SOLN
2.0000 g | INTRAVENOUS | Status: AC
  Administered 2019-10-28 – 2019-11-01 (×5): 2 g via INTRAVENOUS
  Filled 2019-10-28 (×3): qty 20
  Filled 2019-10-28 (×2): qty 2
  Filled 2019-10-28: qty 20

## 2019-10-28 MED ORDER — SODIUM CHLORIDE 0.9 % IV BOLUS
1000.0000 mL | Freq: Once | INTRAVENOUS | Status: AC
  Administered 2019-10-28: 1000 mL via INTRAVENOUS

## 2019-10-28 MED ORDER — ONDANSETRON HCL 4 MG/2ML IJ SOLN: 4.0000 mg | Freq: Four times a day (QID) | INTRAMUSCULAR | Status: DC | PRN

## 2019-10-28 MED ORDER — ACETAMINOPHEN 325 MG PO TABS: 650.0000 mg | ORAL_TABLET | ORAL | Status: DC | PRN

## 2019-10-28 MED ORDER — HYDROCORTISONE NA SUCCINATE PF 100 MG IJ SOLR
100.0000 mg | Freq: Once | INTRAMUSCULAR | Status: AC
  Administered 2019-10-28: 100 mg via INTRAVENOUS
  Filled 2019-10-28: qty 2

## 2019-10-28 MED ORDER — SODIUM CHLORIDE 0.9 % IV SOLN
250.0000 mL | INTRAVENOUS | Status: DC
  Administered 2019-10-28: 250 mL via INTRAVENOUS

## 2019-10-28 MED ORDER — HEPARIN SODIUM (PORCINE) 5000 UNIT/ML IJ SOLN
5000.0000 [IU] | Freq: Three times a day (TID) | INTRAMUSCULAR | Status: DC
  Administered 2019-10-28 – 2019-10-29 (×2): 5000 [IU] via SUBCUTANEOUS
  Filled 2019-10-28 (×2): qty 1

## 2019-10-28 MED ORDER — NOREPINEPHRINE 4 MG/250ML-% IV SOLN
0.0000 ug/min | INTRAVENOUS | Status: DC
  Administered 2019-10-28: 2 ug/min via INTRAVENOUS
  Filled 2019-10-28: qty 250

## 2019-10-28 MED ORDER — THIAMINE HCL 100 MG/ML IJ SOLN
250.0000 mg | INTRAVENOUS | Status: AC
  Administered 2019-10-28 – 2019-10-30 (×3): 250 mg via INTRAVENOUS
  Filled 2019-10-28 (×4): qty 2.5

## 2019-10-28 MED ORDER — HYDROCORTISONE NA SUCCINATE PF 100 MG IJ SOLR
50.0000 mg | Freq: Four times a day (QID) | INTRAMUSCULAR | Status: DC
  Administered 2019-10-28 – 2019-10-30 (×6): 50 mg via INTRAVENOUS
  Filled 2019-10-28 (×6): qty 2

## 2019-10-28 MED ORDER — PANTOPRAZOLE SODIUM 40 MG IV SOLR
40.0000 mg | Freq: Every day | INTRAVENOUS | Status: DC
  Administered 2019-10-28 – 2019-10-30 (×3): 40 mg via INTRAVENOUS
  Filled 2019-10-28 (×3): qty 40

## 2019-10-28 MED ORDER — POLYETHYLENE GLYCOL 3350 17 G PO PACK: 17.0000 g | PACK | Freq: Every day | ORAL | Status: DC | PRN

## 2019-10-28 MED ORDER — METHYLPREDNISOLONE SODIUM SUCC 125 MG IJ SOLR
125.0000 mg | Freq: Once | INTRAMUSCULAR | Status: AC
  Administered 2019-10-28: 125 mg via INTRAVENOUS
  Filled 2019-10-28: qty 2

## 2019-10-28 MED ORDER — SODIUM CHLORIDE 0.9 % IV SOLN
500.0000 mg | INTRAVENOUS | Status: AC
  Administered 2019-10-28 – 2019-10-30 (×3): 500 mg via INTRAVENOUS
  Filled 2019-10-28 (×4): qty 500

## 2019-10-28 MED ORDER — NOREPINEPHRINE 4 MG/250ML-% IV SOLN
2.0000 ug/min | INTRAVENOUS | Status: DC
  Administered 2019-10-28: 4 ug/min via INTRAVENOUS

## 2019-10-28 MED ORDER — DOCUSATE SODIUM 100 MG PO CAPS: 100.0000 mg | ORAL_CAPSULE | Freq: Two times a day (BID) | ORAL | Status: DC | PRN

## 2019-10-28 MED ORDER — PERFLUTREN LIPID MICROSPHERE
1.0000 mL | INTRAVENOUS | Status: AC | PRN
  Administered 2019-10-28: 5 mL via INTRAVENOUS
  Filled 2019-10-28: qty 10

## 2019-10-28 MED ORDER — FLUDROCORTISONE ACETATE 0.1 MG PO TABS
0.1000 mg | ORAL_TABLET | Freq: Every day | ORAL | Status: DC
  Administered 2019-10-28 – 2019-10-29 (×2): 0.1 mg via ORAL
  Filled 2019-10-28 (×3): qty 1

## 2019-10-28 MED ORDER — LACTATED RINGERS IV BOLUS
1000.0000 mL | Freq: Once | INTRAVENOUS | Status: AC
  Administered 2019-10-28: 1000 mL via INTRAVENOUS

## 2019-10-28 NOTE — ED Triage Notes (Signed)
Pt BIBA from home due to feeling ill for 2 days with nausea, vomiting, SOB, and fatigue. Pt states his home pulse oximetery read a "low" pulse so family called EMS. Pt found by EMS to have BP with a systolic of 50/30 and clammy. Pt rec'd bolus of NS with improvement of BP. Pt states "I was seen at Urgent Care yesterday and they swabbed me for COVID, but I have not gotten my results yet, I am fully vaccinated though". Pt appears to be diaphoretic and pale upon assessment. MD at bedside.

## 2019-10-28 NOTE — H&P (Signed)
NAME:  Trevor Rollins, MRN:  [id number], DOB:  03-29-1970, LOS: 0 ADMISSION DATE:  10/28/2019, CONSULTATION DATE:  10/28/2019 REFERRING MD:  Pilar Plate, CHIEF COMPLAINT:  Hypotension    Brief History   49 year old gentleman past medical history of hyperlipidemia hypertension, obesity, asthma.  Patient presents with a 3-day history of nausea vomiting diarrhea.  Had persistent hypotension despite fluid resuscitation x3 L normal saline in the emergency department.  Patient was started on peripheral norepinephrine and pulmonary critical care was consulted.  History of present illness   49 year old gentleman past medical history hypertension, hyperlipidemia, obesity, asthma.  Patient presents with a 3-day history of nausea vomiting and diarrhea.  Admits to nocturnal fevers, rigors and sweats.  No recent sick contacts.  COVID-19 negative.  Patient was hypotensive upon arrival to the emergency department.  Patient was given IV fluids for resuscitation.  He initially was seen by emergency rooms/urgent care in [place] yesterday.  Patient was discharged home.  However was recommended per patient to be evaluated in the ED.  He was concerned about wait time in the emergency department.  Yesterday evening continued to feel poor.  Decision was made to call EMS for transfer to the emergency department.  Here he was found to have systolic blood pressures in the 60s.  Was given 3 L of fluid.  Despite fluid resuscitation remained hypotensive.  Patient was started on peripheral norepinephrine.  Per nursing staff at bedside patient already has a better "color".  Per nurse patient was cold and clammy initially.  No prior cardiac history except for hypertension.  Per patient states he tried to keep up with his fluid intake over the past couple of days.  Past Medical History   Past Medical History:  Diagnosis Date  . Allergy   . Anxiety   . Asthma   . DDD (degenerative disc disease)    pt denies  . Deafness in left ear   .  ED (erectile dysfunction)   . Hyperlipidemia   . Hypertension   . Hypertriglyceridemia   . Insomnia      Significant Hospital Events   ICU admission  Consults:  Pulmonary critical care  Procedures:    Significant Diagnostic Tests:  Lactic acid 4.3  Chest x-ray none 18 2021: Enlarged cardiac silhouette no significant infiltrate.  Or effusion The patient's images have been independently reviewed by me.    Micro Data:  Blood cultures: Pending COVID-19 negative  Antimicrobials:  Ceftriaxone  Azithromycin    Interim history/subjective:  Per HPI above   Objective   Blood pressure 95/71, pulse 88, temperature 97.8 F (36.6 C), temperature source Oral, resp. rate 17, height 6\' 1"  (1.854 m), weight 111.6 kg, SpO2 99 %.       No intake or output data in the 24 hours ending 10/28/19 1523 Filed Weights   10/28/19 1202  Weight: 111.6 kg    Examination: General: Obese male, resting in bed, diaphoretic  HENT: NCAT, tracking  Lungs: CTAB, no crackles no wheeze Cardiovascular: RRR, s1 s2,  Abdomen: mildly distended, NT  Extremities: no significant edema  Neuro: alert oriented following commands, no deficit GU: deferred   Resolved Hospital Problem list     Assessment & Plan:   Shock  Multifactorial, undifferentiated Possible sepsis  Lactic Acidosis  AGMA  Acute renal failure, secondary to above Possible all pre-renal due to history of NVD, however bedside echo with slight depressed EF Plan:  Peripheral low dose NEPI  Maintain MAP >60mmhg  Check cortisol  Start stress dose steroids  Thiamine for lactate clearance  Empiric abx, ceftriaxone and azithromycin  Stat ECHO pending   Anemia  Plan:  Follow H&H No overt signs of bleeding   Hyponatremia  - 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  Plan: S/p fluid resucitation  We will recheck and follow   Elevated troponin  - trend  - likely demand ischemia  - echo pending   Best practice:  Diet: clears    Pain/Anxiety/Delirium protocol (if indicated): na VAP protocol (if indicated): na DVT prophylaxis: heparin  GI prophylaxis: ppi  Glucose control: cbg Mobility: br Code Status: full Family Communication: wife at bedside  Disposition: ICU   Labs   CBC: Recent Labs  Lab 10/28/19 1155  WBC 9.3  HGB 11.9*  HCT 37.0*  MCV 99.5  PLT PLATELET CLUMPS NOTED ON SMEAR, UNABLE TO ESTIMATE    Basic Metabolic Panel: Recent Labs  Lab 10/28/19 1155  NA 129*  K 3.7  CL 99  CO2 18*  GLUCOSE 181*  BUN 16  CREATININE 1.47*  CALCIUM 8.2*   GFR: Estimated Creatinine Clearance: 78.7 mL/min (A) (by C-G formula based on SCr of 1.47 mg/dL (H)). Recent Labs  Lab 10/28/19 1155 10/28/19 1218  WBC 9.3  --   LATICACIDVEN  --  4.3*    Liver Function Tests: Recent Labs  Lab 10/28/19 1155  AST 87*  ALT 60*  ALKPHOS 54  BILITOT 1.4*  PROT 5.6*  ALBUMIN 2.8*   No results for input(s): LIPASE, AMYLASE in the last 168 hours. No results for input(s): AMMONIA in the last 168 hours.  ABG No results found for: PHART, PCO2ART, PO2ART, HCO3, TCO2, ACIDBASEDEF, O2SAT   Coagulation Profile: No results for input(s): INR, PROTIME in the last 168 hours.  Cardiac Enzymes: No results for input(s): CKTOTAL, CKMB, CKMBINDEX, TROPONINI in the last 168 hours.  HbA1C: No results found for: HGBA1C  CBG: Recent Labs  Lab 10/28/19 1237  GLUCAP 158*    Review of Systems:   Review of Systems  Constitutional: Positive for malaise/fatigue. Negative for chills, fever and weight loss.  HENT: Negative for hearing loss, sore throat and tinnitus.   Eyes: Negative for blurred vision and double vision.  Respiratory: Positive for sputum production. Negative for cough, hemoptysis, shortness of breath, wheezing and stridor.   Cardiovascular: Negative for chest pain, palpitations, orthopnea, leg swelling and PND.  Gastrointestinal: Positive for abdominal pain, diarrhea, nausea and vomiting. Negative for  constipation and heartburn.  Genitourinary: Negative for dysuria, hematuria and urgency.  Musculoskeletal: Negative for joint pain and myalgias.  Skin: Negative for itching and rash.  Neurological: Negative for dizziness, tingling, weakness and headaches.  Endo/Heme/Allergies: Negative for environmental allergies. Does not bruise/bleed easily.  Psychiatric/Behavioral: Negative for depression. The patient is not nervous/anxious and does not have insomnia.   All other systems reviewed and are negative.    Past Medical History  He,  has a past medical history of Allergy, Anxiety, Asthma, DDD (degenerative disc disease), Deafness in left ear, ED (erectile dysfunction), Hyperlipidemia, Hypertension, Hypertriglyceridemia, and Insomnia.   Surgical History    Past Surgical History:  Procedure Laterality Date  . ORIF ANKLE FRACTURE Right 03/01/2014   Procedure: OPEN REDUCTION INTERNAL FIXATION (ORIF) RIGHT ANKLE FRACTURE;  Surgeon: Cheral Almas, MD;  Location: [facility] OR;  Service: Orthopedics;  Laterality: Right;  . SPINAL CORD STIMULATOR BATTERY EXCHANGE N/A 12/24/2016   Procedure: Replacement of implantable pulse generator,;  Surgeon: Odette Fraction, MD;  Location: [facility] OR;  Service: Neurosurgery;  Laterality: N/A;  Replacement of implantable pulse generator, possible replacement of leads on spinal cord stimulator  . SPINAL CORD STIMULATOR IMPLANT    . TONSILLECTOMY    . WISDOM TOOTH EXTRACTION       Social History   reports that he has never smoked. He has never used smokeless tobacco. He reports current alcohol use. He reports that he does not use drugs.   Family History   His family history includes Cancer in his sister; Stroke in an other family member.   Allergies Allergies  Allergen Reactions  . Penicillins Other (See Comments)    Childhood reaction "redness"  Has patient had a PCN reaction causing immediate rash, facial/tongue/throat swelling, SOB or lightheadedness with  hypotension: No Has patient had a PCN reaction causing severe rash involving mucus membranes or skin necrosis: No Has patient had a PCN reaction that required hospitalization: No Has patient had a PCN reaction occurring within the last 10 years: No If all of the above answers are "NO", then may proceed with Cephalosporin use.      Home Medications  Prior to Admission medications   Medication Sig Start Date End Date Taking? Authorizing Provider  albuterol (VENTOLIN HFA) 108 (90 Base) MCG/ACT inhaler INHALE 4 PUFFS INTO THE LUNGS EVERY 4-6 HOURS AS NEEDED FOR UP TO 30 DAYS. 04/27/19   Alfonse Spruce, MD  citalopram (CELEXA) 20 MG tablet Take 1 tablet (20 mg total) by mouth daily. 05/04/19   Merlyn Albert, MD  diazepam (VALIUM) 5 MG tablet Take 1 tablet (5 mg total) by mouth at bedtime as needed. For insomnia or muscle spasms 05/04/19   Merlyn Albert, MD  diphenhydrAMINE (BENADRYL ALLERGY) 25 MG tablet Take 25 mg by mouth every 6 (six) hours as needed.    [provider]  enalapril (VASOTEC) 10 MG tablet Take 1 tablet (10 mg total) by mouth daily. 05/04/19   Merlyn Albert, MD  fluticasone (FLONASE) 50 MCG/ACT nasal spray Place 2 sprays into both nostrils daily. 04/12/19   Merlyn Albert, MD  fluticasone (FLOVENT HFA) 110 MCG/ACT inhaler Inhale 2 puffs into the lungs 2 (two) times daily. 04/27/19   Alfonse Spruce, MD  HYDROcodone-acetaminophen (NORCO/VICODIN) 5-325 MG tablet One tablet every six hours for pain.  Limit 7 days. 09/18/19   Darreld Mclean, MD  methocarbamol (ROBAXIN) 500 MG tablet Take 1 tablet (500 mg total) by mouth every 6 (six) hours as needed for muscle spasms. 09/19/19   Darreld Mclean, MD  ondansetron (ZOFRAN ODT) 4 MG disintegrating tablet Take 1 tablet (4 mg total) by mouth every 8 (eight) hours as needed for nausea or vomiting. 10/27/19   Coralyn Mark, NP  predniSONE (STERAPRED UNI-PAK 21 TAB) 5 MG (21) TBPK tablet Take 6 pills first day; 5  pills second day; 4 pills third day; 3 pills fourth day; 2 pills next day and 1 pill last day. Patient not taking: Reported on 09/20/2019 09/19/19   Darreld Mclean, MD     This patient is critically ill with multiple organ system failure; which, requires frequent high complexity decision making, assessment, support, evaluation, and titration of therapies. This was completed through the application of advanced monitoring technologies and extensive interpretation of multiple databases. During this encounter critical care time was devoted to patient care services described in this note for 38 minutes.   Josephine Igo, DO [facility] 10/28/2019 3:27 PM

## 2019-10-28 NOTE — ED Provider Notes (Signed)
[facility] Emergency Department Provider Note MRN:  [id number]  Arrival date & time: 10/28/19     Chief Complaint   Hypotension   History of Present Illness   Trevor Rollins is a 49 y.o. year-old male with a history of hypertension presenting to the ED with chief complaint of hypotension.  Patient has been experiencing persistent nausea, vomiting, diarrhea all day yesterday. No blood in the vomit or the stool. Feeling more and more dehydrated, lightheaded when sitting up. Hypotensive with EMS. Denies chest pain, no abdominal pain, denies fever. No other complaints other than feeling generally weak and unwell. Symptoms moderate to severe, constant, no exacerbating or alleviating factors.  Review of Systems  A complete 10 system review of systems was obtained and all systems are negative except as noted in the HPI and PMH.   Patient's Health History    Past Medical History:  Diagnosis Date  . Allergy   . Anxiety   . Asthma   . DDD (degenerative disc disease)    pt denies  . Deafness in left ear   . ED (erectile dysfunction)   . Hyperlipidemia   . Hypertension   . Hypertriglyceridemia   . Insomnia     Past Surgical History:  Procedure Laterality Date  . ORIF ANKLE FRACTURE Right 03/01/2014   Procedure: OPEN REDUCTION INTERNAL FIXATION (ORIF) RIGHT ANKLE FRACTURE;  Surgeon: Cheral AlmasNaiping Malak Duchesneau Xu, MD;  Location: [facility] OR;  Service: Orthopedics;  Laterality: Right;  . SPINAL CORD STIMULATOR BATTERY EXCHANGE N/A 12/24/2016   Procedure: Replacement of implantable pulse generator,;  Surgeon: Odette FractionHarkins, Paul, MD;  [facility];  Service: Neurosurgery;  Laterality: N/A;  Replacement of implantable pulse generator, possible replacement of leads on spinal cord stimulator  . SPINAL CORD STIMULATOR IMPLANT    . TONSILLECTOMY    . WISDOM TOOTH EXTRACTION      Family History  Problem Relation Age of Onset  . Cancer Sister   . Stroke Other     Social History    Socioeconomic History  . Marital status: Married    Spouse name: Not on file  . Number of children: Not on file  . Years of education: Not on file  . Highest education level: Not on file  Occupational History  . Not on file  Tobacco Use  . Smoking status: Never Smoker  . Smokeless tobacco: Never Used  Vaping Use  . Vaping Use: Never used  Substance and Sexual Activity  . Alcohol use: Yes    Comment: occasional  . Drug use: No  . Sexual activity: Not on file  Other Topics Concern  . Not on file  Social History Narrative  . Not on file   Social Determinants of Health   Financial Resource Strain:   . Difficulty of Paying Living Expenses: Not on file  Food Insecurity:   . Worried About Programme researcher, broadcasting/film/videounning Out of Food in the Last Year: Not on file  . Ran Out of Food in the Last Year: Not on file  Transportation Needs:   . Lack of Transportation (Medical): Not on file  . Lack of Transportation (Non-Medical): Not on file  Physical Activity:   . Days of Exercise per Week: Not on file  . Minutes of Exercise per Session: Not on file  Stress:   . Feeling of Stress : Not on file  Social Connections:   . Frequency of Communication with Friends and Family: Not on file  . Frequency of Social Gatherings with  Friends and Family: Not on file  . Attends Religious Services: Not on file  . Active Member of Clubs or Organizations: Not on file  . Attends Banker Meetings: Not on file  . Marital Status: Not on file  Intimate Partner Violence:   . Fear of Current or Ex-Partner: Not on file  . Emotionally Abused: Not on file  . Physically Abused: Not on file  . Sexually Abused: Not on file     Physical Exam   Vitals:   10/28/19 1615 10/28/19 1630  BP: 110/74 123/89  Pulse: 84 82  Resp: (!) 23 (!) 24  Temp:    SpO2: 99% 100%    CONSTITUTIONAL: Well-appearing, NAD, appears tired, pale NEURO:  Alert and oriented x 3, no focal deficits EYES:  eyes equal and reactive ENT/NECK:   no LAD, no JVD CARDIO: Regular rate, well-perfused, normal S1 and S2 PULM:  CTAB no wheezing or rhonchi GI/GU:  normal bowel sounds, non-distended, non-tender MSK/SPINE:  No gross deformities, no edema SKIN:  no rash, atraumatic PSYCH:  Appropriate speech and behavior  *Additional and/or pertinent findings included in MDM below  Diagnostic and Interventional Summary    EKG Interpretation  Date/Time:  Saturday October 28 2019 11:52:56 EDT Ventricular Rate:  93 PR Interval:    QRS Duration: 96 QT Interval:  347 QTC Calculation: 432 R Axis:   -17 Text Interpretation: Sinus rhythm Borderline left axis deviation Borderline low voltage, extremity leads Confirmed by Kennis Carina [phone]) on 10/28/2019 12:26:39 PM      Labs Reviewed  CBC - Abnormal; Notable for the following components:      Result Value   RBC 3.72 (*)    Hemoglobin 11.9 (*)    HCT 37.0 (*)    All other components within normal limits  COMPREHENSIVE METABOLIC PANEL - Abnormal; Notable for the following components:   Sodium 129 (*)    CO2 18 (*)    Glucose, Bld 181 (*)    Creatinine, Ser 1.47 (*)    Calcium 8.2 (*)    Total Protein 5.6 (*)    Albumin 2.8 (*)    AST 87 (*)    ALT 60 (*)    Total Bilirubin 1.4 (*)    GFR calc non Af Amer 55 (*)    All other components within normal limits  LACTIC ACID, PLASMA - Abnormal; Notable for the following components:   Lactic Acid, Venous 4.3 (*)    All other components within normal limits  CBC WITH DIFFERENTIAL/PLATELET - Abnormal; Notable for the following components:   WBC 15.4 (*)    RBC 3.80 (*)    Hemoglobin 12.2 (*)    HCT 37.9 (*)    Platelets 148 (*)    All other components within normal limits  CBG MONITORING, ED - Abnormal; Notable for the following components:   Glucose-Capillary 158 (*)    All other components within normal limits  TROPONIN I (HIGH SENSITIVITY) - Abnormal; Notable for the following components:   Troponin I (High Sensitivity) 208  (*)    All other components within normal limits  TROPONIN I (HIGH SENSITIVITY) - Abnormal; Notable for the following components:   Troponin I (High Sensitivity) 230 (*)    All other components within normal limits  CULTURE, BLOOD (SINGLE)  SARS CORONAVIRUS 2 BY RT PCR (HOSPITAL ORDER, PERFORMED IN [facility])  URINALYSIS, ROUTINE W REFLEX MICROSCOPIC  HIV ANTIBODY (ROUTINE TESTING W REFLEX)  CBC  COMPREHENSIVE METABOLIC  PANEL  MAGNESIUM  PHOSPHORUS  LIPASE, BLOOD  AMYLASE  LACTIC ACID, PLASMA  LACTIC ACID, PLASMA  PROCALCITONIN  BRAIN NATRIURETIC PEPTIDE  CORTISOL  STREP PNEUMONIAE URINARY ANTIGEN  LEGIONELLA PNEUMOPHILA SEROGP 1 UR AG    CT ABDOMEN PELVIS WO CONTRAST  Final Result    DG Chest Port 1 View  Final Result    DG Chest Port 1 View    (Results Pending)    Medications  hydrocortisone sodium succinate (SOLU-CORTEF) 100 MG injection 100 mg (100 mg Intravenous Given 10/28/19 1600)    Followed by  hydrocortisone sodium succinate (SOLU-CORTEF) 100 MG injection 50 mg (has no administration in time range)  fludrocortisone (FLORINEF) tablet 0.1 mg (has no administration in time range)  thiamine (B-1) 250 mg in sodium chloride 0.9 % 50 mL IVPB (has no administration in time range)  docusate sodium (COLACE) capsule 100 mg (has no administration in time range)  polyethylene glycol (MIRALAX / GLYCOLAX) packet 17 g (has no administration in time range)  heparin injection 5,000 Units (has no administration in time range)  acetaminophen (TYLENOL) tablet 650 mg (has no administration in time range)  ondansetron (ZOFRAN) injection 4 mg (has no administration in time range)  pantoprazole (PROTONIX) injection 40 mg (has no administration in time range)  0.9 %  sodium chloride infusion (has no administration in time range)  cefTRIAXone (ROCEPHIN) 2 g in sodium chloride 0.9 % 100 mL IVPB (0 g Intravenous Stopped 10/28/19 1637)  azithromycin (ZITHROMAX) 500 mg in sodium  chloride 0.9 % 250 mL IVPB (has no administration in time range)  norepinephrine (LEVOPHED) 4mg  in premix infusion (3 mcg/min Intravenous Rate/Dose Change 10/28/19 1635)  perflutren lipid microspheres (DEFINITY) IV suspension (5 mLs Intravenous Given 10/28/19 1636)  sodium chloride 0.9 % bolus 1,000 mL (0 mLs Intravenous Stopped 10/28/19 1306)  sodium chloride 0.9 % bolus 1,000 mL (0 mLs Intravenous Stopped 10/28/19 1314)  methylPREDNISolone sodium succinate (SOLU-MEDROL) 125 mg/2 mL injection 125 mg (125 mg Intravenous Given 10/28/19 1314)  sodium chloride 0.9 % bolus 1,000 mL (0 mLs Intravenous Stopped 10/28/19 1547)  lactated ringers bolus 1,000 mL (1,000 mLs Intravenous New Bag/Given 10/28/19 1639)     Procedures  /  Critical Care .Critical Care Performed by: 10/30/19, MD Authorized by: Sabas Sous, MD   Critical care provider statement:    Critical care time (minutes):  45   Critical care was necessary to treat or prevent imminent or life-threatening deterioration of the following conditions:  Shock   Critical care was time spent personally by me on the following activities:  Discussions with consultants, evaluation of patient's response to treatment, examination of patient, ordering and performing treatments and interventions, ordering and review of laboratory studies, ordering and review of radiographic studies, pulse oximetry, re-evaluation of patient's condition, obtaining history from patient or surrogate and review of old charts    ED Course and Medical Decision Making  I have reviewed the triage vital signs, the nursing notes, and pertinent available records from the EMR.  Listed above are laboratory and imaging tests that I personally ordered, reviewed, and interpreted and then considered in my medical decision making (see below for details).  Suspect hypotension is related to hypovolemia in the setting of profuse nausea vomiting and diarrhea. Abdomen is soft and  nontender. No fever, suspecting viral gastrointestinal illness, possibly COVID-19. Pressures initially 70 systolic, improving with fluids. Clinical Course as of Oct 28 1639  Sat Oct 28, 2019  1309 Persistent hypotension  despite fluid resuscitation, continues to be conversant but without radial palpable pulse.  Considering underlying sepsis or intra-abdominal source, also considering adrenal crisis.  Providing Solu-Medrol, fourth liter crystalloid but after that we will need to proceed with norepinephrine.   [MB]    Clinical Course User Index [MB] Sabas Sous, MD     Patient responding to pressors, was up to 8 mics per minute norepinephrine, now titrating that down, suspect that postvoid and/or Solu-Medrol are taking effect as well.  Admitted to intensivist service for further care.  Elmer Sow. Pilar Plate, MD [facility] Emergency [facility] [email or web address]  Final Clinical Impressions(s) / ED Diagnoses     ICD-10-CM   1. Shock (HCC)  R57.9   2. Nausea vomiting and diarrhea  R11.2    R19.7     ED Discharge Orders    None       Discharge Instructions Discussed with and Provided to Patient:   Discharge Instructions   None       Sabas Sous, MD 10/28/19 1642

## 2019-10-28 NOTE — Progress Notes (Addendum)
  Echocardiogram 2D Echocardiogram has been performed with Definity.  Gerda Diss 10/28/2019, 4:59 PM

## 2019-10-28 NOTE — ED Notes (Signed)
Date and time results received: 10/28/19 1256 (use smartphrase ".now" to insert current time)  Test: Troponin Critical Value: 208  Name of Provider Notified: Bero  Orders Received? Or Actions Taken?: No new orders. [facility]

## 2019-10-29 ENCOUNTER — Inpatient Hospital Stay (HOSPITAL_COMMUNITY): Payer: BC Managed Care – PPO

## 2019-10-29 DIAGNOSIS — R112 Nausea with vomiting, unspecified: Secondary | ICD-10-CM | POA: Diagnosis present

## 2019-10-29 DIAGNOSIS — R7989 Other specified abnormal findings of blood chemistry: Secondary | ICD-10-CM

## 2019-10-29 DIAGNOSIS — E872 Acidosis, unspecified: Secondary | ICD-10-CM | POA: Diagnosis present

## 2019-10-29 DIAGNOSIS — E8729 Other acidosis: Secondary | ICD-10-CM | POA: Diagnosis present

## 2019-10-29 DIAGNOSIS — R778 Other specified abnormalities of plasma proteins: Secondary | ICD-10-CM

## 2019-10-29 DIAGNOSIS — E871 Hypo-osmolality and hyponatremia: Secondary | ICD-10-CM | POA: Diagnosis present

## 2019-10-29 LAB — LACTIC ACID, PLASMA
Lactic Acid, Venous: 3.5 mmol/L (ref 0.5–1.9)
Lactic Acid, Venous: 2.4 mmol/L (ref 0.5–1.9)

## 2019-10-29 LAB — BASIC METABOLIC PANEL
Anion gap: 12 (ref 5–15)
Anion gap: 10 (ref 5–15)
BUN: 22 mg/dL — ABNORMAL HIGH (ref 6–20)
BUN: 22 mg/dL — ABNORMAL HIGH (ref 6–20)
CO2: 14 mmol/L — ABNORMAL LOW (ref 22–32)
CO2: 17 mmol/L — ABNORMAL LOW (ref 22–32)
Calcium: 7.7 mg/dL — ABNORMAL LOW (ref 8.9–10.3)
Calcium: 8.2 mg/dL — ABNORMAL LOW (ref 8.9–10.3)
Chloride: 99 mmol/L (ref 98–111)
Chloride: 103 mmol/L (ref 98–111)
Creatinine, Ser: 1.83 mg/dL — ABNORMAL HIGH (ref 0.61–1.24)
Creatinine, Ser: 1.63 mg/dL — ABNORMAL HIGH (ref 0.61–1.24)
GFR calc Af Amer: 49 mL/min — ABNORMAL LOW (ref >60)
GFR calc Af Amer: 56 mL/min — ABNORMAL LOW (ref >60)
GFR calc non Af Amer: 42 mL/min — ABNORMAL LOW (ref >60)
GFR calc non Af Amer: 48 mL/min — ABNORMAL LOW (ref >60)
Glucose, Bld: 256 mg/dL — ABNORMAL HIGH (ref 70–99)
Glucose, Bld: 253 mg/dL — ABNORMAL HIGH (ref 70–99)
Potassium: 4.9 mmol/L (ref 3.5–5.1)
Potassium: 4.7 mmol/L (ref 3.5–5.1)
Sodium: 125 mmol/L — ABNORMAL LOW (ref 135–145)
Sodium: 130 mmol/L — ABNORMAL LOW (ref 135–145)

## 2019-10-29 LAB — CBC
HCT: 35.2 % — ABNORMAL LOW (ref 39.0–52.0)
Hemoglobin: 11.7 g/dL — ABNORMAL LOW (ref 13.0–17.0)
MCH: 32.1 pg (ref 26.0–34.0)
MCHC: 33.2 g/dL (ref 30.0–36.0)
MCV: 96.7 fL (ref 80.0–100.0)
Platelets: 130 10*3/uL — ABNORMAL LOW (ref 150–400)
RBC: 3.64 MIL/uL — ABNORMAL LOW (ref 4.22–5.81)
RDW: 13.2 % (ref 11.5–15.5)
WBC: 14.9 10*3/uL — ABNORMAL HIGH (ref 4.0–10.5)
nRBC: 0 % (ref 0.0–0.2)

## 2019-10-29 LAB — PHOSPHORUS: Phosphorus: 1.4 mg/dL — ABNORMAL LOW (ref 2.5–4.6)

## 2019-10-29 LAB — TROPONIN I (HIGH SENSITIVITY)
Troponin I (High Sensitivity): 2268 ng/L (ref <18)
Troponin I (High Sensitivity): 1664 ng/L (ref <18)

## 2019-10-29 LAB — HEMOGLOBIN A1C
Hgb A1c MFr Bld: 5.4 % (ref 4.8–5.6)
Mean Plasma Glucose: 108.28 mg/dL

## 2019-10-29 LAB — HEPATITIS PANEL, ACUTE
HCV Ab: NONREACTIVE
Hep A IgM: NONREACTIVE
Hep B C IgM: NONREACTIVE
Hepatitis B Surface Ag: NONREACTIVE

## 2019-10-29 LAB — OSMOLALITY: Osmolality: 286 mOsm/kg (ref 275–295)

## 2019-10-29 LAB — MAGNESIUM: Magnesium: 1.5 mg/dL — ABNORMAL LOW (ref 1.7–2.4)

## 2019-10-29 MED ORDER — SODIUM PHOSPHATES 45 MMOLE/15ML IV SOLN
30.0000 mmol | Freq: Once | INTRAVENOUS | Status: AC
  Administered 2019-10-29: 30 mmol via INTRAVENOUS
  Filled 2019-10-29: qty 10

## 2019-10-29 MED ORDER — HEPARIN SODIUM (PORCINE) 5000 UNIT/ML IJ SOLN
5000.0000 [IU] | Freq: Three times a day (TID) | INTRAMUSCULAR | Status: DC
  Administered 2019-10-29 – 2019-11-01 (×8): 5000 [IU] via SUBCUTANEOUS
  Filled 2019-10-29 (×8): qty 1

## 2019-10-29 MED ORDER — INFLUENZA VAC SPLIT QUAD 0.5 ML IM SUSY
0.5000 mL | PREFILLED_SYRINGE | INTRAMUSCULAR | Status: AC
  Administered 2019-10-30: 0.5 mL via INTRAMUSCULAR
  Filled 2019-10-29: qty 0.5

## 2019-10-29 MED ORDER — HEPARIN (PORCINE) 25000 UT/250ML-% IV SOLN
1400.0000 [IU]/h | INTRAVENOUS | Status: DC
  Administered 2019-10-29: 1400 [IU]/h via INTRAVENOUS
  Filled 2019-10-29: qty 250

## 2019-10-29 MED ORDER — MAGNESIUM SULFATE 2 GM/50ML IV SOLN
2.0000 g | Freq: Once | INTRAVENOUS | Status: AC
  Administered 2019-10-29: 2 g via INTRAVENOUS
  Filled 2019-10-29: qty 50

## 2019-10-29 MED ORDER — CHLORHEXIDINE GLUCONATE CLOTH 2 % EX PADS
6.0000 | MEDICATED_PAD | Freq: Every day | CUTANEOUS | Status: DC
  Administered 2019-10-29 – 2019-10-30 (×2): 6 via TOPICAL

## 2019-10-29 MED ORDER — METOPROLOL TARTRATE 5 MG/5ML IV SOLN
2.5000 mg | Freq: Four times a day (QID) | INTRAVENOUS | Status: DC
  Administered 2019-10-29: 2.5 mg via INTRAVENOUS
  Filled 2019-10-29: qty 5

## 2019-10-29 MED ORDER — FLUTICASONE PROPIONATE 50 MCG/ACT NA SUSP
2.0000 | Freq: Every day | NASAL | Status: DC
  Administered 2019-10-29 – 2019-11-01 (×4): 2 via NASAL
  Filled 2019-10-29: qty 16

## 2019-10-29 MED ORDER — ASPIRIN 81 MG PO CHEW
81.0000 mg | CHEWABLE_TABLET | Freq: Every day | ORAL | Status: DC
  Administered 2019-10-29 – 2019-11-01 (×4): 81 mg via ORAL
  Filled 2019-10-29 (×4): qty 1

## 2019-10-29 MED ORDER — ALBUTEROL SULFATE HFA 108 (90 BASE) MCG/ACT IN AERS
4.0000 | INHALATION_SPRAY | Freq: Four times a day (QID) | RESPIRATORY_TRACT | Status: DC | PRN
  Filled 2019-10-29: qty 6.7

## 2019-10-29 MED ORDER — HEPARIN SODIUM (PORCINE) 5000 UNIT/ML IJ SOLN: 5000.0000 [IU] | Freq: Three times a day (TID) | INTRAMUSCULAR | Status: DC

## 2019-10-29 NOTE — Progress Notes (Signed)
eLink Physician-Brief Progress Note Patient Name: Trevor Rollins DOB: [birth year] MRN: [id number]   Date of Service  10/29/2019  HPI/Events of Note  Troponin = 230 --> 2268. Cardiac Echo >> EF = 40-45%. LV demonstrates global hypokinesis. Moderate LVH. Grade I diastolic dysfunction.   eICU Interventions  Plan: 1. 12 Lead EKG STAT. 2. D/C Heparin [place]. 3. Heparin IV infusion per pharmacy consult.  4. ASA 81 mg PO now and Q day.  5. Metoprolol 2.5 mg IV now and Q 6 hours. Hold for SBP < 105 or HR < 60.      Intervention Category Major Interventions: Other:  [name] 10/29/2019, 5:53 AM

## 2019-10-29 NOTE — Consult Note (Signed)
Cardiology Consultation:   Patient ID: Trevor Rollins MRN: [id number]; DOB: 12-05-1970  Admit date: 10/28/2019 Date of Consult: 10/29/2019  Primary Care Provider: Annalee Genta, DO [facility] Cardiologist:New, Dr Wyline [facility] Electrophysiologist:  None    Patient Profile:   Trevor Rollins is a 49 y.o. male with a hx of HTN, HLwho is being seen today for the evaluation of elevated troponin at the request of Dr Caleb Popp.  History of Present Illness:   Trevor Rollins 49 yo male histoyr of HL, HTN, asthma admitted with 3 day history of nausea and vomiting. Hypotensive in ER with reported SBPs in the 60s that was persistent despite aggressive IVFs, required norepi drip.COVID 19 neg  No significant SOB. Reports some chest pain associated with voming, no other symptoms.      WBC 9.3 Hgb 11.9 K 3.7 Na 129 Cr 1.47 BUN 16 AST 87 ALT 60 T bili 1.4 Lactic avid 4.3 Mg 1.4 Lipase 64  Procalcitonin 28 hstrop 208-->230-->2268-->1664 BNP 568  COVID neg EKG SR, no ischemic changes Echo: LVEF 40-45%, global hypokinesis, grade I DDx, mod RV dysfunction though TAPSE 1.7  CXR : Cardiomegaly, no overt edema CT Abd: nonspecific edema hilum of liver, proximal duodenum.    Past Medical History:  Diagnosis Date  . Allergy   . Anxiety   . Asthma   . DDD (degenerative disc disease)    pt denies  . Deafness in left ear   . ED (erectile dysfunction)   . Hyperlipidemia   . Hypertension   . Hypertriglyceridemia   . Insomnia     Past Surgical History:  Procedure Laterality Date  . ORIF ANKLE FRACTURE Right 03/01/2014   Procedure: OPEN REDUCTION INTERNAL FIXATION (ORIF) RIGHT ANKLE FRACTURE;  Surgeon: Cheral Almas, MD;  Location: [facility] OR;  Service: Orthopedics;  Laterality: Right;  . SPINAL CORD STIMULATOR BATTERY EXCHANGE N/A 12/24/2016   Procedure: Replacement of implantable pulse generator,;  Surgeon: Odette Fraction, MD;  Location: [facility] OR;  Service: Neurosurgery;  Laterality: N/A;   Replacement of implantable pulse generator, possible replacement of leads on spinal cord stimulator  . SPINAL CORD STIMULATOR IMPLANT    . TONSILLECTOMY    . WISDOM TOOTH EXTRACTION       Inpatient Medications: Scheduled Meds: . aspirin  81 mg Oral Daily  . Chlorhexidine Gluconate Cloth  6 each Topical Daily  . fludrocortisone  0.1 mg Oral Daily  . fluticasone  2 spray Each Nare Daily  . hydrocortisone sod succinate (SOLU-CORTEF) inj  50 mg Intravenous Q6H  . metoprolol tartrate  2.5 mg Intravenous Q6H  . pantoprazole (PROTONIX) IV  40 mg Intravenous QHS   Continuous Infusions: . sodium chloride 10 mL/hr at 10/29/19 0400  . azithromycin Stopped (10/28/19 1824)  . cefTRIAXone (ROCEPHIN)  IV Stopped (10/28/19 1637)  . heparin 1,400 Units/hr (10/29/19 0657)  . magnesium sulfate bolus IVPB 2 g (10/29/19 0953)  . norepinephrine (LEVOPHED) Adult infusion Stopped (10/28/19 1813)  . sodium phosphate  Dextrose 5% IVPB    . thiamine injection Stopped (10/28/19 1830)   PRN Meds: acetaminophen, albuterol, docusate sodium, ondansetron (ZOFRAN) IV, polyethylene glycol  Allergies:    Allergies  Allergen Reactions  . Penicillins Other (See Comments)    Childhood reaction "redness"  Has patient had a PCN reaction causing immediate rash, facial/tongue/throat swelling, SOB or lightheadedness with hypotension: No Has patient had a PCN reaction causing severe rash involving mucus membranes or skin necrosis: No Has patient had a PCN  reaction that required hospitalization: No Has patient had a PCN reaction occurring within the last 10 years: No If all of the above answers are "NO", then may proceed with Cephalosporin use.     Social History:   Social History   Socioeconomic History  . Marital status: Married    Spouse name: Not on file  . Number of children: Not on file  . Years of education: Not on file  . Highest education level: Not on file  Occupational History  . Not on file    Tobacco Use  . Smoking status: Never Smoker  . Smokeless tobacco: Never Used  Vaping Use  . Vaping Use: Never used  Substance and Sexual Activity  . Alcohol use: Yes    Comment: occasional  . Drug use: No  . Sexual activity: Not on file  Other Topics Concern  . Not on file  Social History Narrative  . Not on file   Social Determinants of Health   Financial Resource Strain:   . Difficulty of Paying Living Expenses: Not on file  Food Insecurity:   . Worried About Programme researcher, broadcasting/film/video in the Last Year: Not on file  . Ran Out of Food in the Last Year: Not on file  Transportation Needs:   . Lack of Transportation (Medical): Not on file  . Lack of Transportation (Non-Medical): Not on file  Physical Activity:   . Days of Exercise per Week: Not on file  . Minutes of Exercise per Session: Not on file  Stress:   . Feeling of Stress : Not on file  Social Connections:   . Frequency of Communication with Friends and Family: Not on file  . Frequency of Social Gatherings with Friends and Family: Not on file  . Attends Religious Services: Not on file  . Active Member of Clubs or Organizations: Not on file  . Attends Banker Meetings: Not on file  . Marital Status: Not on file  Intimate Partner Violence:   . Fear of Current or Ex-Partner: Not on file  . Emotionally Abused: Not on file  . Physically Abused: Not on file  . Sexually Abused: Not on file    Family History:    Family History  Problem Relation Age of Onset  . Cancer Sister   . Stroke Other      ROS:  Please see the history of present illness.  All other ROS reviewed and negative.     Physical Exam/Data:   Vitals:   10/29/19 0500 10/29/19 0600 10/29/19 0700 10/29/19 0800  BP: 113/78 (!) 122/94 108/90 (!) 120/94  Pulse: 86 77 77 77  Resp: (!) 25 (!) 25 (!) 22 (!) 23  Temp:   98.6 F (37 C)   TempSrc:   Oral   SpO2: 98% 97% 98% 97%  Weight: 118.6 kg     Height:        Intake/Output Summary  (Last 24 hours) at 10/29/2019 1049 Last data filed at 10/29/2019 0900 Gross per 24 hour  Intake 2468.3 ml  Output 2375 ml  Net 93.3 ml   Last 3 Weights 10/29/2019 10/28/2019 09/19/2019  Weight (lbs) 261 lb 7.5 oz 246 lb 239 lb  Weight (kg) 118.6 kg 111.585 kg 108.41 kg     Body mass index is 34.5 kg/m.  General:  Well nourished, well developed, in no acute distress HEENT: normal Lymph: no adenopathy Neck: no JVD Endocrine:  No thryomegaly Vascular: No carotid bruits; FA pulses 2+  bilaterally without bruits  Cardiac:  normal S1, S2; RRR; no murmur  Lungs:  clear to auscultation bilaterally, no wheezing, rhonchi or rales  Abd: soft, nontender, no hepatomegaly  Ext: no edema Musculoskeletal:  No deformities, BUE and BLE strength normal and equal Skin: warm and dry  Neuro:  CNs 2-12 intact, no focal abnormalities noted Psych:  Normal affect     Relevant CV Studies:  Laboratory Data:  High Sensitivity Troponin:   Recent Labs  Lab 10/28/19 1155 10/28/19 1409 10/29/19 0147 10/29/19 0830  TROPONINIHS 208* 230* 2,268* 1,664*     Chemistry Recent Labs  Lab 10/28/19 1155 10/28/19 1607 10/29/19 0147  NA 129* 129* 125*  K 3.7 4.9 4.9  CL 99 100 99  CO2 18* 17* 14*  GLUCOSE 181* 174* 256*  BUN 16 20 22*  CREATININE 1.47* 1.66* 1.83*  CALCIUM 8.2* 7.7* 7.7*  GFRNONAA 55* 47* 42*  GFRAA >60 55* 49*  ANIONGAP 12 12 12     Recent Labs  Lab 10/28/19 1155 10/28/19 1607  PROT 5.6* 5.6*  ALBUMIN 2.8* 2.8*  AST 87* 89*  ALT 60* 61*  ALKPHOS 54 46  BILITOT 1.4* 1.7*   Hematology Recent Labs  Lab 10/28/19 1155 10/28/19 1607 10/29/19 0147  WBC 9.3 15.4* 14.9*  RBC 3.72* 3.80* 3.64*  HGB 11.9* 12.2* 11.7*  HCT 37.0* 37.9* 35.2*  MCV 99.5 99.7 96.7  MCH 32.0 32.1 32.1  MCHC 32.2 32.2 33.2  RDW 13.1 13.2 13.2  PLT PLATELET CLUMPS NOTED ON SMEAR, UNABLE TO ESTIMATE 148* 130*   BNP Recent Labs  Lab 10/28/19 1607  BNP 568.1*    DDimer No results for input(s):  DDIMER in the last 168 hours.   Radiology/Studies:  CT ABDOMEN PELVIS WO CONTRAST  Result Date: 10/28/2019 CLINICAL DATA:  49 year old male with a history of abdominal pain nausea and vomiting EXAM: CT ABDOMEN AND PELVIS WITHOUT CONTRAST TECHNIQUE: Multidetector CT imaging of the abdomen and pelvis was performed following the standard protocol without IV contrast. COMPARISON:  None. FINDINGS: Lower chest: Atelectasis/scarring at the lung bases. Hepatobiliary: Decreased attenuation of the liver parenchyma. Unremarkable gallbladder. No significant pericholecystic fluid or inflammatory changes. Hazy edema within the fat in the liver hilum, adjacent to the proximal duodenum. Pancreas: Unremarkable pancreas Spleen: Unremarkable spleen Adrenals/Urinary Tract: - Right adrenal gland:  Unremarkable - Left adrenal gland: Unremarkable. - Right kidney: No hydronephrosis, nephrolithiasis, ureteral dilation. Mild perinephric stranding/edema. - Left Kidney: No hydronephrosis, nephrolithiasis, ureteral dilation. Mild perinephric stranding/edema - Urinary Bladder: Urinary bladder relatively decompressed Stomach/Bowel: - Stomach: Unremarkable. - Small bowel: Unremarkable - Appendix: Normal. - Colon: Unremarkable. Vascular/Lymphatic: No significant vascular findings are present. No enlarged abdominal or pelvic lymph nodes. Reproductive: Prostate calcifications. Other: Generator within the anterior right abdominal soft tissues. Electrodes terminate within the canal in the lower thoracic spine. Musculoskeletal: No acute displaced fracture. Vacuum disc phenomenon at L5-S1. 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 IMPRESSION: Nonspecific edema in the hilum of the liver and near the proximal duodenum. Correlation with liver enzymes and pancreatic enzymes may be useful, and if there is ongoing concern for acute process in the upper abdomen, consideration of contrast-enhanced CT. Negative for bowel obstruction.  Steatosis. 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 Electronically Signed   By: 44 D.O.   On: 10/28/2019 14:01   DG Chest Port 1 View  Result Date: 10/29/2019 CLINICAL DATA:  Sepsis EXAM: PORTABLE CHEST 1 VIEW COMPARISON:  10/28/2019; CT abdomen pelvis-10/28/2019 FINDINGS: Grossly  unchanged enlarged cardiac silhouette and mediastinal contours. Persistently reduced lung volumes. Grossly unchanged perihilar and bilateral medial basilar heterogeneous opacities. No new focal airspace opacities. No pleural effusion or pneumothorax. No evidence of edema. No acute osseous abnormalities. IMPRESSION: Similar findings cardiomegaly, perihilar and bibasilar atelectasis without superimposed acute cardiopulmonary disease on this AP portable examination. Electronically Signed   By: Simonne ComeJohn  Watts M.D.   On: 10/29/2019 08:48   DG Chest Port 1 View  Result Date: 10/28/2019 CLINICAL DATA:  Hypotension EXAM: PORTABLE CHEST 1 VIEW COMPARISON:  None. FINDINGS: The cardiomediastinal silhouette is enlarged in contour.Perihilar vascular fullness without overt edema. No pleural effusion. No pneumothorax. LEFT lower lobe atelectasis. Partial visualization of spinal stimulator lead. Visualized abdomen is unremarkable. Multilevel degenerative changes of the thoracic spine. IMPRESSION: Cardiomegaly without overt edema. LEFT lower lobe atelectasis. Electronically Signed   By: Meda KlinefelterStephanie  Peacock MD   On: 10/28/2019 12:27   ECHOCARDIOGRAM COMPLETE  Result Date: 10/28/2019    ECHOCARDIOGRAM REPORT   Patient Name:   Baruch MerlBRENT H Cyran Date of Exam: 10/28/2019 Medical Rec #:  [id number]    Height:       73.0 in Accession #:    [id number]   Weight:       246.0 lb Date of Birth:  12/16/1970     BSA:          2.350 m Patient Age:    49 years     BP:           122/83 mmHg Patient Gender: M            HR:           82 bpm. Exam Location:  Inpatient Procedure: 2D Echo, Cardiac Doppler and Color Doppler STAT ECHO Indications:     Cardiomyopathy-Ischemic 414.8 / I25.5                 Cardiomyopathy-Unspecified 425.9 / I42.9  History:        Patient has no prior history of Echocardiogram examinations.                 Risk Factors:Hypertension and Dyslipidemia.  Sonographer:    Ross LudwigArthur Guy RDCS (AE) Referring Phys: [id number] BRADLEY L ICARD IMPRESSIONS  1. Left ventricular ejection fraction, by estimation, is 40 to 45%. The left ventricle has mildly decreased function. The left ventricle demonstrates global hypokinesis. There is moderate left ventricular hypertrophy. Left ventricular diastolic parameters are consistent with Grade I diastolic dysfunction (impaired relaxation).  2. Right ventricular systolic function is moderately reduced. The right ventricular size is mildly enlarged. There is normal pulmonary artery systolic pressure.  3. The mitral valve is normal in structure. Trivial mitral valve regurgitation. No evidence of mitral stenosis.  4. The aortic valve is normal in structure. Aortic valve regurgitation is not visualized. No aortic stenosis is present.  5. The inferior vena cava is normal in size with greater than 50% respiratory variability, suggesting right atrial pressure of 3 mmHg. FINDINGS  Left Ventricle: Left ventricular ejection fraction, by estimation, is 40 to 45%. The left ventricle has mildly decreased function. The left ventricle demonstrates global hypokinesis. The left ventricular internal cavity size was normal in size. There is  moderate left ventricular hypertrophy. Left ventricular diastolic parameters are consistent with Grade I diastolic dysfunction (impaired relaxation). Right Ventricle: The right ventricular size is mildly enlarged. No increase in right ventricular wall thickness. Right ventricular systolic function is moderately reduced. There is normal pulmonary artery systolic pressure. The tricuspid  regurgitant velocity is 2.18 m/s, and with an assumed right atrial pressure of 15 mmHg, the estimated right  ventricular systolic pressure is 34.0 mmHg. Left Atrium: Left atrial size was normal in size. Right Atrium: Right atrial size was normal in size. Pericardium: There is no evidence of pericardial effusion. Mitral Valve: The mitral valve is normal in structure. Trivial mitral valve regurgitation. No evidence of mitral valve stenosis. Tricuspid Valve: The tricuspid valve is normal in structure. Tricuspid valve regurgitation is trivial. No evidence of tricuspid stenosis. Aortic Valve: The aortic valve is normal in structure. Aortic valve regurgitation is not visualized. No aortic stenosis is present. Aortic valve mean gradient measures 3.0 mmHg. Aortic valve peak gradient measures 4.3 mmHg. Aortic valve area, by VTI measures 4.05 cm. Pulmonic Valve: The pulmonic valve was normal in structure. Pulmonic valve regurgitation is trivial. No evidence of pulmonic stenosis. Aorta: The aortic root is normal in size and structure. Venous: The inferior vena cava is normal in size with greater than 50% respiratory variability, suggesting right atrial pressure of 3 mmHg. IAS/Shunts: No atrial level shunt detected by color flow Doppler.  LEFT VENTRICLE PLAX 2D LVIDd:         4.30 cm      Diastology LVIDs:         3.20 cm      LV e' medial:    7.40 cm/s LV PW:         1.80 cm      LV E/e' medial:  11.2 LV IVS:        1.70 cm      LV e' lateral:   8.81 cm/s LVOT diam:     2.50 cm      LV E/e' lateral: 9.4 LV SV:         74 LV SV Index:   31 LVOT Area:     4.91 cm  LV Volumes (MOD) LV vol d, MOD A2C: 194.0 ml LV vol d, MOD A4C: 133.0 ml LV vol s, MOD A2C: 106.0 ml LV vol s, MOD A4C: 77.7 ml LV SV MOD A2C:     88.0 ml LV SV MOD A4C:     133.0 ml LV SV MOD BP:      77.8 ml RIGHT VENTRICLE            IVC RV Basal diam:  4.30 cm    IVC diam: 2.30 cm RV Mid diam:    4.20 cm RV S prime:     9.03 cm/s TAPSE (M-mode): 1.7 cm LEFT ATRIUM             Index       RIGHT ATRIUM           Index LA diam:        4.30 cm 1.83 cm/m  RA Area:     23.60  cm LA Vol (A2C):   73.6 ml 31.33 ml/m RA Volume:   68.30 ml  29.07 ml/m LA Vol (A4C):   70.2 ml 29.88 ml/m LA Biplane Vol: 76.0 ml 32.35 ml/m  AORTIC VALVE AV Area (Vmax):    4.01 cm AV Area (Vmean):   3.77 cm AV Area (VTI):     4.05 cm AV Vmax:           104.00 cm/s AV Vmean:          78.900 cm/s AV VTI:            0.182 m AV Peak Grad:  4.3 mmHg AV Mean Grad:      3.0 mmHg LVOT Vmax:         84.90 cm/s LVOT Vmean:        60.600 cm/s LVOT VTI:          0.150 m LVOT/AV VTI ratio: 0.82  AORTA Ao Root diam: 3.60 cm Ao Asc diam:  3.80 cm MITRAL VALVE               TRICUSPID VALVE MV Area (PHT): 5.66 cm    TR Peak grad:   19.0 mmHg MV Decel Time: 134 msec    TR Vmax:        218.00 cm/s MV E velocity: 82.60 cm/s MV A velocity: 76.50 cm/s  SHUNTS MV E/A ratio:  1.08        Systemic VTI:  0.15 m                            Systemic Diam: 2.50 cm Donato Schultz MD Electronically signed by Donato Schultz MD Signature Date/Time: 10/28/2019/5:11:33 PM    Final    { Assessment and Plan:   1. Shock - in setting of volume loss from N/V, elevated lactic acid and procalcitonin suggesting component of vasodilatory/septic shock - transiently on pressors, now off. Has been started on stress dose steroids by primary team.   2. Systolic dysfunction - LVEF 40-45% by echo. No prior echo. There is global hypokinesis, no focal WMAs - suspect stress induced CM - if bp's remains stable tomorrow start low dose beta blocker, low dose ARB in near future.  - with probable stress induced CM and mild dysfunction, would plan 3 months medical therapy and repeat echo. If ongoing dysfunciton consider ischemic evaluation at that time.   3. RV dysfuntion - 2020 PFTs mild restriction - high BMI, wife reports severe snoring - would start workup with outpatient sleep study, can arrange at follow up - given clear evidence of hypovolemia and probable septic shock, rapid recovery of bp's I do not think the RV dysfunction is acute. No  SOB/DOE or hypoxia to suggest PE.   4. Elevated troponin - in setting of shock likely combined septic/hypovolemic, initial SBPs in 60s - EKG without acute icshcemic changes - no cardiopulmonary symptoms - echo with decreased LVEF 40-45% but no focal WMAs -suspect demand ishcemia. NO plans for inpatient ischemic testing. With AKI also would look to avoid now.  - can stop heparin gtt, start just DVT prophylasix.    4. AKI - per primary team, possibly hypotensive injury   5. Hyponatremia Per primary team   For questions or updates, please contact [facility] Please consult [email or web address] for contact info under    Signed, Dina Rich, MD  10/29/2019 10:49 AM

## 2019-10-29 NOTE — Progress Notes (Signed)
ANTICOAGULATION CONSULT NOTE - Initial Consult  Pharmacy Consult for heparin Indication: r/o ACS  Allergies  Allergen Reactions  . Penicillins Other (See Comments)    Childhood reaction "redness"  Has patient had a PCN reaction causing immediate rash, facial/tongue/throat swelling, SOB or lightheadedness with hypotension: No Has patient had a PCN reaction causing severe rash involving mucus membranes or skin necrosis: No Has patient had a PCN reaction that required hospitalization: No Has patient had a PCN reaction occurring within the last 10 years: No If all of the above answers are "NO", then may proceed with Cephalosporin use.     Patient Measurements: Height: 6\' 1"  (185.4 cm) Weight: 118.6 kg (261 lb 7.5 oz) IBW/kg (Calculated) : 79.9 Heparin Dosing Weight: 105kg  Vital Signs: Temp: 98 F (36.7 C) (09/19 0411) Temp Source: Oral (09/19 0411) BP: 113/88 (09/19 0400) Pulse Rate: 79 (09/19 0400)  Labs: Recent Labs    10/28/19 1155 10/28/19 1155 10/28/19 1409 10/28/19 1607 10/29/19 0147  HGB 11.9*   < >  --  12.2* 11.7*  HCT 37.0*  --   --  37.9* 35.2*  PLT PLATELET CLUMPS NOTED ON SMEAR, UNABLE TO ESTIMATE  --   --  148* 130*  CREATININE 1.47*  --   --  1.66* 1.83*  TROPONINIHS 208*  --  230*  --  2,268*   < > = values in this interval not displayed.    Estimated Creatinine Clearance: 65.2 mL/min (A) (by C-G formula based on SCr of 1.83 mg/dL (H)).   Medical History: Past Medical History:  Diagnosis Date  . Allergy   . Anxiety   . Asthma   . DDD (degenerative disc disease)    pt denies  . Deafness in left ear   . ED (erectile dysfunction)   . Hyperlipidemia   . Hypertension   . Hypertriglyceridemia   . Insomnia     Medications:  Medications Prior to Admission  Medication Sig Dispense Refill Last Dose  . albuterol (VENTOLIN HFA) 108 (90 Base) MCG/ACT inhaler INHALE 4 PUFFS INTO THE LUNGS EVERY 4-6 HOURS AS NEEDED FOR UP TO 30 DAYS. (Patient taking  differently: Inhale 4 puffs into the lungs every 6 (six) hours as needed for wheezing or shortness of breath. ) 18 g 2 Past Month at Unknown time  . citalopram (CELEXA) 20 MG tablet Take 1 tablet (20 mg total) by mouth daily. 90 tablet 1 10/26/2019 at Unknown  . diazepam (VALIUM) 5 MG tablet Take 1 tablet (5 mg total) by mouth at bedtime as needed. For insomnia or muscle spasms (Patient taking differently: Take 2.5 mg by mouth at bedtime as needed for muscle spasms (insomnia). ) 30 tablet 1 10/27/2019 at Unknown time  . diphenhydrAMINE (BENADRYL ALLERGY) 25 MG tablet Take 25 mg by mouth every 6 (six) hours as needed for allergies.    10/26/2019 at Unknown  . enalapril (VASOTEC) 10 MG tablet Take 1 tablet (10 mg total) by mouth daily. 90 tablet 1 10/26/2019 at Unknown  . fluticasone (FLONASE) 50 MCG/ACT nasal spray Place 2 sprays into both nostrils daily. (Patient taking differently: Place 2 sprays into both nostrils daily as needed for allergies or rhinitis. ) 16 g 5 Past Month at Unknown time  . gabapentin (NEURONTIN) 300 MG capsule Take 300 mg by mouth 3 (three) times daily.   10/27/2019 at Unknown time  . HYDROcodone-acetaminophen (NORCO/VICODIN) 5-325 MG tablet One tablet every six hours for pain.  Limit 7 days. (Patient taking differently: Take  1 tablet by mouth every 6 (six) hours as needed for moderate pain. ) 28 tablet 0 Past Month at Unknown time  . methocarbamol (ROBAXIN) 500 MG tablet Take 1 tablet (500 mg total) by mouth every 6 (six) hours as needed for muscle spasms. 60 tablet 0 Past Month at Unknown time  . ondansetron (ZOFRAN ODT) 4 MG disintegrating tablet Take 1 tablet (4 mg total) by mouth every 8 (eight) hours as needed for nausea or vomiting. 20 tablet 0 10/27/2019 at Unknown time  . predniSONE (STERAPRED UNI-PAK 21 TAB) 5 MG (21) TBPK tablet Take 6 pills first day; 5 pills second day; 4 pills third day; 3 pills fourth day; 2 pills next day and 1 pill last day. 21 tablet 0 Past Week at  Unknown time  . tiZANidine (ZANAFLEX) 4 MG tablet Take 4 mg by mouth every 8 (eight) hours as needed for muscle spasms.    10/27/2019 at Unknown time  . fluticasone (FLOVENT HFA) 110 MCG/ACT inhaler Inhale 2 puffs into the lungs 2 (two) times daily. (Patient not taking: Reported on 10/28/2019) 1 Inhaler 5 Not Taking at Unknown time   Scheduled:  . aspirin  81 mg Oral Daily  . Chlorhexidine Gluconate Cloth  6 each Topical Daily  . fludrocortisone  0.1 mg Oral Daily  . hydrocortisone sod succinate (SOLU-CORTEF) inj  50 mg Intravenous Q6H  . metoprolol tartrate  2.5 mg Intravenous Q6H  . pantoprazole (PROTONIX) IV  40 mg Intravenous QHS   Infusions:  . sodium chloride 10 mL/hr at 10/29/19 0400  . azithromycin Stopped (10/28/19 1824)  . cefTRIAXone (ROCEPHIN)  IV Stopped (10/28/19 1637)  . norepinephrine (LEVOPHED) Adult infusion Stopped (10/28/19 1813)  . thiamine injection Stopped (10/28/19 1830)    Assessment: 49yo male admitted w/ shock, initially with mildly elevated troponin, now rising (208>230>2268), to begin heparin.  Goal of Therapy:  Heparin level 0.3-0.7 units/ml Monitor platelets by anticoagulation protocol: Yes   Plan:  Rec'd SQ heparin 5000 units 1h ago; will start heparin gtt at 1400 units/hr and monitor heparin levels and CBC.  Vernard Gambles, PharmD, BCPS  10/29/2019,5:57 AM

## 2019-10-29 NOTE — Progress Notes (Addendum)
[facility]  PROGRESS NOTE  Trevor Rollins YNX:[id number] DOB: 12/24/1970 DOA: 10/28/2019 PCP: Erven Colla, DO Admit date - 10/28/2019   Admitting Physician Garner Nash, DO  Outpatient Primary MD for the patient is Erven Colla, DO  LOS - 1 Brief Narrative   Trevor Rollins is a 49 y.o. year old male with medical history significant for obesity, HTN, HLD, asthma who presented on 10/28/2019 with 3 days of nausea, vomiting and diarrhea.  He states he slept and woke up on Thursday morning with intense shivers, feeling feverish, and repeated vomiting episodes of nonbilious nonbloody emesis and diarrhea, with no noted sick contacts.  He was seen in urgent care on Friday overall he had a blood pressure of 85/68 and was encouraged to increase his fluid intake as patient was hesitant to go to ED due to prolonged wait time.  He reports trying to keep up with his fluid losses with Pedialyte, Gatorade and water.  He cannot remember but he thinks he stopped taking his blood pressure medicines given how poorly he felt.  EMS was called at his house given persistent low blood pressure and at that time EMS evaluation was found to have SBP in the 60s for which he was given 3 L of fluid remained hypotensive.  In the ED he was afebrile, tachypneic, noted blood pressure 79/55 with a MAP at his lowest of 57.  Lab work notable for lactic acid of 4.3, sodium 129, creatinine 1.47, CO2 18, AST 89, ALT 61, mag 1.4, Phos 1.8, lipase 64, BNP 568, CBC with WBC of 15.4 and hemoglobin of 12.2 platelets of 148.  Initial troponin of 208.  Covid test negative.  Blood cultures were obtained patient was started on empiric ceftriaxone and azithromycin.  Chest x-ray showed cardiomegaly without overt edema and left lower lobe atelectasis.  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  Patient was admitted to ICU as he was started on peripheral norepinephrine, stress dose steroids, and fluid resuscitation.  Care was transferred to [facility] on 9/19 given significant provement in blood pressure and no longer requiring pressor support.  Overnight on 9/18 patient started on heparin drip due to continued elevation in troponin (230---2268) with a TTE showing EF of 40-45% with global hypokinesis of left ventricle, patient was on aspirin, as well as IV metoprolol 2.5 mg every 6, (with hold parameters for blood pressure/heart rate).  EKG obtained showed no acute ischemic changes.     Subjective  Today denies any chest pain, no abdominal pain, no cough, no shortness of breath.  States he has been able to urinate quite a bit since last night up until this morning.  A & P    Elevated troponin, Suspect demand ischemia in the setting of profound hypotension.  Troponin has now peaked at 2268 and now downtrending.  TTE shows EF of 40-45% with mildly decreased function as well as global hypokinesis.  EKG shows no acute ischemic changes and patient remains without any chest pain -Patient was started on IV heparin overnight, as well as aspirin and IV Lopressor 2.5 mg every 6 hours -Consulted cardiology, appreciate their assistance and further recommendations  Shock,multifactorial etiology.  Suspect being driven mostly by nausea/vomiting and diarrhea with inability to replace fluid losses.  However also ruling out infection.  TTE shows slightly reduced EF with  global hypokinesis. Cortisol of 47 -Continue stress dose IV steroids, also on Florinef (patient was prescribed a prednisone dose pack back on 08/29/19) -Azithromycin, ceftriaxone, monitor blood cultures -No longer requiring pressor support  Elevated LFTs.  No abdominal pain.  Alk phos/T bili unremarkable.  Given nausea/vomiting diarrhea important rule out any hepatitis -Check acute hepatitis panel -Serial CMP  Hypovolemic  hyponatremia, moderate.  Seem to be in the setting of profound fluid losses but sodium has actually worsened with fluids, he is now having adequate urine output --repeat BMP this afternoon  -Continue to closely monitor CMP -Check urine/serum osm, urine sodium, TSH  AKI, likely pre renal from shock  Baseline creatinine less than one 1.4 on admission, currently 1.83.  Still having adequate urinary output -Continue to monitor output -Avoid nephrotoxins -Monitor BMP  Lactic acidosis, in the setting of profound hypotension, stable.  4.3 on admission downtrend to 3.1, last checked 2.5.  Anticipate this is much improved given improvement in blood pressure -Repeat lactic acid to ensure continues to downtrend  Hypophosphatemia.  Last phosphorus of 1.4 -We will replete, and monitor daily  Hypomagnesemia.  Mag of 1.5 -We will replete -Monitor daily  Chronic back pain with right sided sciatica Has a spinal nerve stimulator in right lower abdomen(revision 2019)  HTN, BP much improved with SBP in 120s -hold home enalipril     Family Communication  :  none  Code Status :  FULL Code  Disposition Plan  :  Patient is from home. Anticipated d/c date: 2 to 3 days. Barriers to d/c or necessity for inpatient status:  Patient currently on IV stress dose steroids, needs close monitoring of his blood pressure given profound hypotension/shock that he presented with, also undergoing cardiology evaluation given his elevated troponin with slightly reduced EF and global hypokinesis with potential for stress cardiomyopathy, also requiring IV antibiotics while ruling out infectious process. Consults  :  Cardiology  Procedures  :  TTE, 9/18  DVT Prophylaxis  :  Heparin drip  MDM: The below labs and imaging reports were reviewed and summarized above.  Medication management as above.  Lab Results  Component Value Date   PLT 130 (L) 10/29/2019    Diet :  Diet Order            Diet clear liquid Room  service appropriate? Yes; Fluid consistency: Thin  Diet effective now                  Inpatient Medications Scheduled Meds: . aspirin  81 mg Oral Daily  . Chlorhexidine Gluconate Cloth  6 each Topical Daily  . fludrocortisone  0.1 mg Oral Daily  . hydrocortisone sod succinate (SOLU-CORTEF) inj  50 mg Intravenous Q6H  . metoprolol tartrate  2.5 mg Intravenous Q6H  . pantoprazole (PROTONIX) IV  40 mg Intravenous QHS   Continuous Infusions: . sodium chloride 10 mL/hr at 10/29/19 0400  . azithromycin Stopped (10/28/19 1824)  . cefTRIAXone (ROCEPHIN)  IV Stopped (10/28/19 1637)  . heparin 1,400 Units/hr (10/29/19 0657)  . magnesium sulfate bolus IVPB    . norepinephrine (LEVOPHED) Adult infusion Stopped (10/28/19 1813)  . sodium phosphate  Dextrose 5% IVPB    . thiamine injection Stopped (10/28/19 1830)   PRN Meds:.acetaminophen, docusate sodium, ondansetron (ZOFRAN) IV, polyethylene glycol  Antibiotics  :   Anti-infectives (From admission, onward)   Start     Dose/Rate Route Frequency Ordered Stop   10/28/19 1600  cefTRIAXone (ROCEPHIN) 2 g in sodium chloride 0.9 %  100 mL IVPB        2 g 200 mL/hr over 30 Minutes Intravenous Every 24 hours 10/28/19 1545     10/28/19 1600  azithromycin (ZITHROMAX) 500 mg in sodium chloride 0.9 % 250 mL IVPB        500 mg 250 mL/hr over 60 Minutes Intravenous Every 24 hours 10/28/19 1545         Objective   Vitals:   10/29/19 0500 10/29/19 0600 10/29/19 0700 10/29/19 0800  BP: 113/78 (!) 122/94 108/90 (!) 120/94  Pulse: 86 77 77 77  Resp: (!) 25 (!) 25 (!) 22 (!) 23  Temp:   98.6 F (37 C)   TempSrc:   Oral   SpO2: 98% 97% 98% 97%  Weight: 118.6 kg     Height:        SpO2: 97 %  Wt Readings from Last 3 Encounters:  10/29/19 118.6 kg  09/19/19 108.4 kg  08/29/19 105.7 kg     Intake/Output Summary (Last 24 hours) at 10/29/2019 0949 Last data filed at 10/29/2019 0900 Gross per 24 hour  Intake 2468.3 ml  Output 2375 ml  Net  93.3 ml    Physical Exam:     Awake Alert, Oriented X 3, Normal affect No new F.N deficits,  [place].AT, Normal respiratory effort on room air, CTAB RRR,No Gallops,Rubs or new Murmurs,  +ve B.Sounds, Abd Soft, No tenderness, No rebound, guarding or rigidity. Spinal stimulator in place in right lower abdomen No Cyanosis, No new Rash or bruise     I have personally reviewed the following:   Data Reviewed:  CBC Recent Labs  Lab 10/28/19 1155 10/28/19 1607 10/29/19 0147  WBC 9.3 15.4* 14.9*  HGB 11.9* 12.2* 11.7*  HCT 37.0* 37.9* 35.2*  PLT PLATELET CLUMPS NOTED ON SMEAR, UNABLE TO ESTIMATE 148* 130*  MCV 99.5 99.7 96.7  MCH 32.0 32.1 32.1  MCHC 32.2 32.2 33.2  RDW 13.1 13.2 13.2  LYMPHSABS  --  0.0*  --   MONOABS  --  0.2  --   EOSABS  --  0.0  --   BASOSABS  --  0.0  --     Chemistries  Recent Labs  Lab 10/28/19 1155 10/28/19 1607 10/29/19 0147  NA 129* 129* 125*  K 3.7 4.9 4.9  CL 99 100 99  CO2 18* 17* 14*  GLUCOSE 181* 174* 256*  BUN 16 20 22*  CREATININE 1.47* 1.66* 1.83*  CALCIUM 8.2* 7.7* 7.7*  MG  --  1.4* 1.5*  AST 87* 89*  --   ALT 60* 61*  --   ALKPHOS 54 46  --   BILITOT 1.4* 1.7*  --    ------------------------------------------------------------------------------------------------------------------ No results for input(s): CHOL, HDL, LDLCALC, TRIG, CHOLHDL, LDLDIRECT in the last 72 hours.  No results found for: HGBA1C ------------------------------------------------------------------------------------------------------------------ No results for input(s): TSH, T4TOTAL, T3FREE, THYROIDAB in the last 72 hours.  Invalid input(s): FREET3 ------------------------------------------------------------------------------------------------------------------ No results for input(s): VITAMINB12, FOLATE, FERRITIN, TIBC, IRON, RETICCTPCT in the last 72 hours.  Coagulation profile No results for input(s): INR, PROTIME in the last 168 hours.  No  results for input(s): DDIMER in the last 72 hours.  Cardiac Enzymes No results for input(s): CKMB, TROPONINI, MYOGLOBIN in the last 168 hours.  Invalid input(s): CK ------------------------------------------------------------------------------------------------------------------    Component Value Date/Time   BNP 568.1 (H) 10/28/2019 1607    Micro Results Recent Results (from the past 240 hour(s))  Culture, blood (single) w Reflex to ID Panel  Status: None (Preliminary result)   Collection Time: 10/28/19 12:16 PM   Specimen: BLOOD  Result Value Ref Range Status   Specimen Description BLOOD RIGHT ANTECUBITAL  Final   Special Requests   Final    BOTTLES DRAWN AEROBIC AND ANAEROBIC Blood Culture adequate volume   Culture   Final    NO GROWTH <12 HOURS Performed at [facility], [address], [place], [place] [address]    Report Status PENDING  Incomplete  SARS Coronavirus 2 by RT PCR (hospital order, performed in [facility] hospital lab) Nasopharyngeal Nasopharyngeal Swab     Status: None   Collection Time: 10/28/19 12:33 PM   Specimen: Nasopharyngeal Swab  Result Value Ref Range Status   SARS Coronavirus 2 NEGATIVE NEGATIVE Final    Comment: (NOTE) SARS-CoV-2 target nucleic acids are NOT DETECTED.  The SARS-CoV-2 RNA is generally detectable in upper and lower respiratory specimens during the acute phase of infection. The lowest concentration of SARS-CoV-2 viral copies this assay can detect is 250 copies / mL. A negative result does not preclude SARS-CoV-2 infection and should not be used as the sole basis for treatment or other patient management decisions.  A negative result may occur with improper specimen collection / handling, submission of specimen other than nasopharyngeal swab, presence of viral mutation(s) within the areas targeted by this assay, and inadequate number of viral copies (<250 copies / mL). A negative result must be combined with  clinical observations, patient history, and epidemiological information.  Fact Sheet for Patients:   StrictlyIdeas.no  Fact Sheet for Healthcare Providers: BankingDealers.co.za  This test is not yet approved or  cleared by the [place] FDA and has been authorized for detection and/or diagnosis of SARS-CoV-2 by FDA under an Emergency Use Authorization (EUA).  This EUA will remain in effect (meaning this test can be used) for the duration of the COVID-19 declaration under Section 564(b)(1) of the Act, 21 U.S.C. section 360bbb-3(b)(1), unless the authorization is terminated or revoked sooner.  Performed at [facility], [place] [address] [place], [place] [address]   MRSA PCR Screening     Status: None   Collection Time: 10/28/19  5:16 PM   Specimen: Nasal Mucosa; Nasopharyngeal  Result Value Ref Range Status   MRSA by PCR NEGATIVE NEGATIVE Final    Comment:        The GeneXpert MRSA Assay (FDA approved for NASAL specimens only), is one component of a comprehensive MRSA colonization surveillance program. It is not intended to diagnose MRSA infection nor to guide or monitor treatment for MRSA infections. Performed at [facility], [place] [address], [place],  [address]     Radiology Reports CT ABDOMEN PELVIS WO CONTRAST  Result Date: 10/28/2019 CLINICAL DATA:  49 year old male with a history of abdominal pain nausea and vomiting EXAM: CT ABDOMEN AND PELVIS WITHOUT CONTRAST TECHNIQUE: Multidetector CT imaging of the abdomen and pelvis was performed following the standard protocol without IV contrast. COMPARISON:  None. FINDINGS: Lower chest: Atelectasis/scarring at the lung bases. Hepatobiliary: Decreased attenuation of the liver parenchyma. Unremarkable gallbladder. No significant pericholecystic fluid or inflammatory changes. Hazy edema within the fat in the liver hilum, adjacent to the proximal duodenum. Pancreas:  Unremarkable pancreas Spleen: Unremarkable spleen Adrenals/Urinary Tract: - Right adrenal gland:  Unremarkable - Left adrenal gland: Unremarkable. - Right kidney: No hydronephrosis, nephrolithiasis, ureteral dilation. Mild perinephric stranding/edema. - Left Kidney: No hydronephrosis, nephrolithiasis, ureteral dilation. Mild perinephric stranding/edema - Urinary Bladder: Urinary  bladder relatively decompressed Stomach/Bowel: - Stomach: Unremarkable. - Small bowel: Unremarkable - Appendix: Normal. - Colon: Unremarkable. Vascular/Lymphatic: No significant vascular findings are present. No enlarged abdominal or pelvic lymph nodes. Reproductive: Prostate calcifications. Other: Generator within the anterior right abdominal soft tissues. Electrodes terminate within the canal in the lower thoracic spine. Musculoskeletal: No acute displaced fracture. Vacuum disc phenomenon at L5-S1. 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 IMPRESSION: Nonspecific edema in the hilum of the liver and near the proximal duodenum. Correlation with liver enzymes and pancreatic enzymes may be useful, and if there is ongoing concern for acute process in the upper abdomen, consideration of contrast-enhanced CT. Negative for bowel obstruction. Steatosis. 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 Electronically Signed   By: Corrie Mckusick D.O.   On: 10/28/2019 14:01   DG Chest Port 1 View  Result Date: 10/29/2019 CLINICAL DATA:  Sepsis EXAM: PORTABLE CHEST 1 VIEW COMPARISON:  10/28/2019; CT abdomen pelvis-10/28/2019 FINDINGS: Grossly unchanged enlarged cardiac silhouette and mediastinal contours. Persistently reduced lung volumes. Grossly unchanged perihilar and bilateral medial basilar heterogeneous opacities. No new focal airspace opacities. No pleural effusion or pneumothorax. No evidence of edema. No acute osseous abnormalities. IMPRESSION: Similar findings cardiomegaly, perihilar and bibasilar atelectasis without  superimposed acute cardiopulmonary disease on this AP portable examination. Electronically Signed   By: Sandi Mariscal M.D.   On: 10/29/2019 08:48   DG Chest Port 1 View  Result Date: 10/28/2019 CLINICAL DATA:  Hypotension EXAM: PORTABLE CHEST 1 VIEW COMPARISON:  None. FINDINGS: The cardiomediastinal silhouette is enlarged in contour.Perihilar vascular fullness without overt edema. No pleural effusion. No pneumothorax. LEFT lower lobe atelectasis. Partial visualization of spinal stimulator lead. Visualized abdomen is unremarkable. Multilevel degenerative changes of the thoracic spine. IMPRESSION: Cardiomegaly without overt edema. LEFT lower lobe atelectasis. Electronically Signed   By: Valentino Saxon MD   On: 10/28/2019 12:27   ECHOCARDIOGRAM COMPLETE  Result Date: 10/28/2019    ECHOCARDIOGRAM REPORT   Patient Name:   NATHYN LUIZ Sturgeon Date of Exam: 10/28/2019 Medical Rec #:  [id number]    Height:       73.0 in Accession #:    [id number]   Weight:       246.0 lb Date of Birth:  12-Apr-1970     BSA:          2.350 m Patient Age:    72 years     BP:           122/83 mmHg Patient Gender: M            HR:           82 bpm. Exam Location:  Inpatient Procedure: 2D Echo, Cardiac Doppler and Color Doppler STAT ECHO Indications:    Cardiomyopathy-Ischemic 414.8 / I25.5                 Cardiomyopathy-Unspecified 425.9 / I42.9  History:        Patient has no prior history of Echocardiogram examinations.                 Risk Factors:Hypertension and Dyslipidemia.  Sonographer:    Clayton Lefort RDCS (AE) Referring Phys: [id number] [place]  1. Left ventricular ejection fraction, by estimation, is 40 to 45%. The left ventricle has mildly decreased function. The left ventricle demonstrates global hypokinesis. There is moderate left ventricular hypertrophy. Left ventricular diastolic parameters are consistent with Grade I diastolic dysfunction (impaired relaxation).  2. Right ventricular systolic function  is moderately  reduced. The right ventricular size is mildly enlarged. There is normal pulmonary artery systolic pressure.  3. The mitral valve is normal in structure. Trivial mitral valve regurgitation. No evidence of mitral stenosis.  4. The aortic valve is normal in structure. Aortic valve regurgitation is not visualized. No aortic stenosis is present.  5. The inferior vena cava is normal in size with greater than 50% respiratory variability, suggesting right atrial pressure of 3 mmHg. FINDINGS  Left Ventricle: Left ventricular ejection fraction, by estimation, is 40 to 45%. The left ventricle has mildly decreased function. The left ventricle demonstrates global hypokinesis. The left ventricular internal cavity size was normal in size. There is  moderate left ventricular hypertrophy. Left ventricular diastolic parameters are consistent with Grade I diastolic dysfunction (impaired relaxation). Right Ventricle: The right ventricular size is mildly enlarged. No increase in right ventricular wall thickness. Right ventricular systolic function is moderately reduced. There is normal pulmonary artery systolic pressure. The tricuspid regurgitant velocity is 2.18 m/s, and with an assumed right atrial pressure of 15 mmHg, the estimated right ventricular systolic pressure is 76.7 mmHg. Left Atrium: Left atrial size was normal in size. Right Atrium: Right atrial size was normal in size. Pericardium: There is no evidence of pericardial effusion. Mitral Valve: The mitral valve is normal in structure. Trivial mitral valve regurgitation. No evidence of mitral valve stenosis. Tricuspid Valve: The tricuspid valve is normal in structure. Tricuspid valve regurgitation is trivial. No evidence of tricuspid stenosis. Aortic Valve: The aortic valve is normal in structure. Aortic valve regurgitation is not visualized. No aortic stenosis is present. Aortic valve mean gradient measures 3.0 mmHg. Aortic valve peak gradient measures 4.3 mmHg. Aortic valve  area, by VTI measures 4.05 cm. Pulmonic Valve: The pulmonic valve was normal in structure. Pulmonic valve regurgitation is trivial. No evidence of pulmonic stenosis. Aorta: The aortic root is normal in size and structure. Venous: The inferior vena cava is normal in size with greater than 50% respiratory variability, suggesting right atrial pressure of 3 mmHg. IAS/Shunts: No atrial level shunt detected by color flow Doppler.  LEFT VENTRICLE PLAX 2D LVIDd:         4.30 cm      Diastology LVIDs:         3.20 cm      LV e' medial:    7.40 cm/s LV PW:         1.80 cm      LV E/e' medial:  11.2 LV IVS:        1.70 cm      LV e' lateral:   8.81 cm/s LVOT diam:     2.50 cm      LV E/e' lateral: 9.4 LV SV:         74 LV SV Index:   31 LVOT Area:     4.91 cm  LV Volumes (MOD) LV vol d, MOD A2C: 194.0 ml LV vol d, MOD A4C: 133.0 ml LV vol s, MOD A2C: 106.0 ml LV vol s, MOD A4C: 77.7 ml LV SV MOD A2C:     88.0 ml LV SV MOD A4C:     133.0 ml LV SV MOD BP:      77.8 ml RIGHT VENTRICLE            IVC RV Basal diam:  4.30 cm    IVC diam: 2.30 cm RV Mid diam:    4.20 cm RV S prime:     9.03 cm/s  TAPSE (M-mode): 1.7 cm LEFT ATRIUM             Index       RIGHT ATRIUM           Index LA diam:        4.30 cm 1.83 cm/m  RA Area:     23.60 cm LA Vol (A2C):   73.6 ml 31.33 ml/m RA Volume:   68.30 ml  29.07 ml/m LA Vol (A4C):   70.2 ml 29.88 ml/m LA Biplane Vol: 76.0 ml 32.35 ml/m  AORTIC VALVE AV Area (Vmax):    4.01 cm AV Area (Vmean):   3.77 cm AV Area (VTI):     4.05 cm AV Vmax:           104.00 cm/s AV Vmean:          78.900 cm/s AV VTI:            0.182 m AV Peak Grad:      4.3 mmHg AV Mean Grad:      3.0 mmHg LVOT Vmax:         84.90 cm/s LVOT Vmean:        60.600 cm/s LVOT VTI:          0.150 m LVOT/AV VTI ratio: 0.82  AORTA Ao Root diam: 3.60 cm Ao Asc diam:  3.80 cm MITRAL VALVE               TRICUSPID VALVE MV Area (PHT): 5.66 cm    TR Peak grad:   19.0 mmHg MV Decel Time: 134 msec    TR Vmax:        218.00 cm/s MV E  velocity: 82.60 cm/s MV A velocity: 76.50 cm/s  SHUNTS MV E/A ratio:  1.08        Systemic VTI:  0.15 m                            Systemic Diam: 2.50 cm Candee Furbish MD Electronically signed by Candee Furbish MD Signature Date/Time: 10/28/2019/5:11:33 PM    Final      Time Spent in minutes  30     Desiree Hane M.D on 10/29/2019 at 9:49 AM  To page go to [email or web address] - password [email or web address]

## 2019-10-30 ENCOUNTER — Encounter (HOSPITAL_COMMUNITY): Payer: Self-pay | Admitting: Pulmonary Disease

## 2019-10-30 DIAGNOSIS — I519 Heart disease, unspecified: Secondary | ICD-10-CM

## 2019-10-30 DIAGNOSIS — R739 Hyperglycemia, unspecified: Secondary | ICD-10-CM

## 2019-10-30 DIAGNOSIS — T380X5A Adverse effect of glucocorticoids and synthetic analogues, initial encounter: Secondary | ICD-10-CM

## 2019-10-30 LAB — CBC
HCT: 34.3 % — ABNORMAL LOW (ref 39.0–52.0)
Hemoglobin: 11.1 g/dL — ABNORMAL LOW (ref 13.0–17.0)
MCH: 31.7 pg (ref 26.0–34.0)
MCHC: 32.4 g/dL (ref 30.0–36.0)
MCV: 98 fL (ref 80.0–100.0)
Platelets: 166 10*3/uL (ref 150–400)
RBC: 3.5 MIL/uL — ABNORMAL LOW (ref 4.22–5.81)
RDW: 13.5 % (ref 11.5–15.5)
WBC: 16 10*3/uL — ABNORMAL HIGH (ref 4.0–10.5)
nRBC: 0 % (ref 0.0–0.2)

## 2019-10-30 LAB — COMPREHENSIVE METABOLIC PANEL
ALT: 44 U/L (ref 0–44)
AST: 34 U/L (ref 15–41)
Albumin: 2.6 g/dL — ABNORMAL LOW (ref 3.5–5.0)
Alkaline Phosphatase: 45 U/L (ref 38–126)
Anion gap: 8 (ref 5–15)
BUN: 23 mg/dL — ABNORMAL HIGH (ref 6–20)
CO2: 21 mmol/L — ABNORMAL LOW (ref 22–32)
Calcium: 8.5 mg/dL — ABNORMAL LOW (ref 8.9–10.3)
Chloride: 107 mmol/L (ref 98–111)
Creatinine, Ser: 1.5 mg/dL — ABNORMAL HIGH (ref 0.61–1.24)
GFR calc Af Amer: >60 mL/min (ref >60)
GFR calc non Af Amer: 54 mL/min — ABNORMAL LOW (ref >60)
Glucose, Bld: 207 mg/dL — ABNORMAL HIGH (ref 70–99)
Potassium: 4.4 mmol/L (ref 3.5–5.1)
Sodium: 136 mmol/L (ref 135–145)
Total Bilirubin: 0.4 mg/dL (ref 0.3–1.2)
Total Protein: 6 g/dL — ABNORMAL LOW (ref 6.5–8.1)

## 2019-10-30 LAB — POCT I-STAT, CHEM 8
BUN: 20 mg/dL (ref 6–20)
Calcium, Ion: 1.03 mmol/L — ABNORMAL LOW (ref 1.15–1.40)
Chloride: 99 mmol/L (ref 98–111)
Creatinine, Ser: 1.4 mg/dL — ABNORMAL HIGH (ref 0.61–1.24)
Glucose, Bld: 176 mg/dL — ABNORMAL HIGH (ref 70–99)
HCT: 36 % — ABNORMAL LOW (ref 39.0–52.0)
Hemoglobin: 12.2 g/dL — ABNORMAL LOW (ref 13.0–17.0)
Potassium: 3.7 mmol/L (ref 3.5–5.1)
Sodium: 130 mmol/L — ABNORMAL LOW (ref 135–145)
TCO2: 19 mmol/L — ABNORMAL LOW (ref 22–32)

## 2019-10-30 LAB — PHOSPHORUS: Phosphorus: 2.5 mg/dL (ref 2.5–4.6)

## 2019-10-30 LAB — MAGNESIUM: Magnesium: 2.5 mg/dL — ABNORMAL HIGH (ref 1.7–2.4)

## 2019-10-30 LAB — NOVEL CORONAVIRUS, NAA: SARS-CoV-2, NAA: NOT DETECTED

## 2019-10-30 MED ORDER — CITALOPRAM HYDROBROMIDE 20 MG PO TABS
20.0000 mg | ORAL_TABLET | Freq: Every day | ORAL | Status: DC
  Administered 2019-10-30 – 2019-11-01 (×3): 20 mg via ORAL
  Filled 2019-10-30 (×3): qty 1

## 2019-10-30 MED ORDER — HYDROCORTISONE NA SUCCINATE PF 100 MG IJ SOLR
50.0000 mg | Freq: Two times a day (BID) | INTRAMUSCULAR | Status: DC
  Administered 2019-10-30 – 2019-10-31 (×2): 50 mg via INTRAVENOUS
  Filled 2019-10-30 (×2): qty 2

## 2019-10-30 MED ORDER — ENSURE MAX PROTEIN PO LIQD
11.0000 [oz_av] | Freq: Every day | ORAL | Status: DC
  Administered 2019-10-31: 11 [oz_av] via ORAL
  Filled 2019-10-30 (×3): qty 330

## 2019-10-30 MED FILL — Perflutren Lipid Microsphere IV Susp 1.1 MG/ML: INTRAVENOUS | Qty: 10 | Status: AC

## 2019-10-30 NOTE — Progress Notes (Signed)
Progress Note  Patient Name: Trevor Rollins Date of Encounter: 10/30/2019  Primary Cardiologist:   No primary care provider on file.   Subjective   Feels OK.  No pain.  No SOB.   Inpatient Medications    Scheduled Meds: . aspirin  81 mg Oral Daily  . Chlorhexidine Gluconate Cloth  6 each Topical Daily  . citalopram  20 mg Oral Daily  . fludrocortisone  0.1 mg Oral Daily  . fluticasone  2 spray Each Nare Daily  . heparin injection (subcutaneous)  5,000 Units Subcutaneous Q8H  . hydrocortisone sod succinate (SOLU-CORTEF) inj  50 mg Intravenous Q12H  . influenza vac split quadrivalent PF  0.5 mL Intramuscular Tomorrow-1000  . pantoprazole (PROTONIX) IV  40 mg Intravenous QHS   Continuous Infusions: . sodium chloride 10 mL/hr at 10/29/19 0400  . azithromycin 250 mL/hr at 10/29/19 1800  . cefTRIAXone (ROCEPHIN)  IV Stopped (10/29/19 1935)  . thiamine injection 250 mg (10/29/19 1741)   PRN Meds: acetaminophen, albuterol, docusate sodium, ondansetron (ZOFRAN) IV, polyethylene glycol   Vital Signs    Vitals:   10/30/19 0732 10/30/19 0800 10/30/19 0850 10/30/19 0900  BP:  (!) 148/117 134/87 (!) 144/98  Pulse:  85 89 84  Resp:  (!) 21 (!) 21 20  Temp: 97.6 F (36.4 C)     TempSrc: Oral     SpO2:  99% 98% 97%  Weight:      Height:        Intake/Output Summary (Last 24 hours) at 10/30/2019 0950 Last data filed at 10/30/2019 0900 Gross per 24 hour  Intake 358.23 ml  Output 3500 ml  Net -3141.77 ml   Filed Weights   10/28/19 1202 10/29/19 0500 10/30/19 0500  Weight: 111.6 kg 118.6 kg 116.3 kg    Telemetry    NSR - Personally Reviewed  ECG    NA - Personally Reviewed  Physical Exam   GEN: No acute distress.   Neck: No  JVD Cardiac: RRR, no murmurs, rubs, or gallops.  Respiratory: Clear  to auscultation bilaterally. GI: Soft, nontender, non-distended  MS: No  edema; No deformity. Neuro:  Nonfocal  Psych: Normal affect   Labs    Chemistry Recent Labs    Lab 10/28/19 1155 10/28/19 1155 10/28/19 1607 10/28/19 1607 10/29/19 0147 10/29/19 1247 10/30/19 0648  NA 129*   < > 129*   < > 125* 130* 136  K 3.7   < > 4.9   < > 4.9 4.7 4.4  CL 99   < > 100   < > 99 103 107  CO2 18*   < > 17*   < > 14* 17* 21*  GLUCOSE 181*   < > 174*   < > 256* 253* 207*  BUN 16   < > 20   < > 22* 22* 23*  CREATININE 1.47*   < > 1.66*   < > 1.83* 1.63* 1.50*  CALCIUM 8.2*   < > 7.7*   < > 7.7* 8.2* 8.5*  PROT 5.6*  --  5.6*  --   --   --  6.0*  ALBUMIN 2.8*  --  2.8*  --   --   --  2.6*  AST 87*  --  89*  --   --   --  34  ALT 60*  --  61*  --   --   --  44  ALKPHOS 54  --  46  --   --   --  45  BILITOT 1.4*  --  1.7*  --   --   --  0.4  GFRNONAA 55*   < > 47*   < > 42* 48* 54*  GFRAA >60   < > 55*   < > 49* 56* >60  ANIONGAP 12   < > 12   < > 12 10 8    < > = values in this interval not displayed.     Hematology Recent Labs  Lab 10/28/19 1607 10/29/19 0147 10/30/19 0648  WBC 15.4* 14.9* 16.0*  RBC 3.80* 3.64* 3.50*  HGB 12.2* 11.7* 11.1*  HCT 37.9* 35.2* 34.3*  MCV 99.7 96.7 98.0  MCH 32.1 32.1 31.7  MCHC 32.2 33.2 32.4  RDW 13.2 13.2 13.5  PLT 148* 130* 166    Cardiac EnzymesNo results for input(s): TROPONINI in the last 168 hours. No results for input(s): TROPIPOC in the last 168 hours.   BNP Recent Labs  Lab 10/28/19 1607  BNP 568.1*     DDimer No results for input(s): DDIMER in the last 168 hours.   Radiology    CT ABDOMEN PELVIS WO CONTRAST  Result Date: 10/28/2019 CLINICAL DATA:  49 year old male with a history of abdominal pain nausea and vomiting EXAM: CT ABDOMEN AND PELVIS WITHOUT CONTRAST TECHNIQUE: Multidetector CT imaging of the abdomen and pelvis was performed following the standard protocol without IV contrast. COMPARISON:  None. FINDINGS: Lower chest: Atelectasis/scarring at the lung bases. Hepatobiliary: Decreased attenuation of the liver parenchyma. Unremarkable gallbladder. No significant pericholecystic fluid or  inflammatory changes. Hazy edema within the fat in the liver hilum, adjacent to the proximal duodenum. Pancreas: Unremarkable pancreas Spleen: Unremarkable spleen Adrenals/Urinary Tract: - Right adrenal gland:  Unremarkable - Left adrenal gland: Unremarkable. - Right kidney: No hydronephrosis, nephrolithiasis, ureteral dilation. Mild perinephric stranding/edema. - Left Kidney: No hydronephrosis, nephrolithiasis, ureteral dilation. Mild perinephric stranding/edema - Urinary Bladder: Urinary bladder relatively decompressed Stomach/Bowel: - Stomach: Unremarkable. - Small bowel: Unremarkable - Appendix: Normal. - Colon: Unremarkable. Vascular/Lymphatic: No significant vascular findings are present. No enlarged abdominal or pelvic lymph nodes. Reproductive: Prostate calcifications. Other: Generator within the anterior right abdominal soft tissues. Electrodes terminate within the canal in the lower thoracic spine. Musculoskeletal: No acute displaced fracture. Vacuum disc phenomenon at L5-S1. 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 IMPRESSION: Nonspecific edema in the hilum of the liver and near the proximal duodenum. Correlation with liver enzymes and pancreatic enzymes may be useful, and if there is ongoing concern for acute process in the upper abdomen, consideration of contrast-enhanced CT. Negative for bowel obstruction. Steatosis. 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 Electronically Signed   By: 44 D.O.   On: 10/28/2019 14:01   DG Chest Port 1 View  Result Date: 10/29/2019 CLINICAL DATA:  Sepsis EXAM: PORTABLE CHEST 1 VIEW COMPARISON:  10/28/2019; CT abdomen pelvis-10/28/2019 FINDINGS: Grossly unchanged enlarged cardiac silhouette and mediastinal contours. Persistently reduced lung volumes. Grossly unchanged perihilar and bilateral medial basilar heterogeneous opacities. No new focal airspace opacities. No pleural effusion or pneumothorax. No evidence of edema. No  acute osseous abnormalities. IMPRESSION: Similar findings cardiomegaly, perihilar and bibasilar atelectasis without superimposed acute cardiopulmonary disease on this AP portable examination. Electronically Signed   By: 10/30/2019 M.D.   On: 10/29/2019 08:48   DG Chest Port 1 View  Result Date: 10/28/2019 CLINICAL DATA:  Hypotension EXAM: PORTABLE CHEST 1 VIEW COMPARISON:  None. FINDINGS: The cardiomediastinal silhouette is enlarged in contour.Perihilar vascular fullness without overt edema.  No pleural effusion. No pneumothorax. LEFT lower lobe atelectasis. Partial visualization of spinal stimulator lead. Visualized abdomen is unremarkable. Multilevel degenerative changes of the thoracic spine. IMPRESSION: Cardiomegaly without overt edema. LEFT lower lobe atelectasis. Electronically Signed   By: Meda Klinefelter MD   On: 10/28/2019 12:27   ECHOCARDIOGRAM COMPLETE  Result Date: 10/28/2019    ECHOCARDIOGRAM REPORT   Patient Name:   RAYSON RANDO Mcvay Date of Exam: 10/28/2019 Medical Rec #:  [id number]    Height:       73.0 in Accession #:    [id number]   Weight:       246.0 lb Date of Birth:  06-12-1970     BSA:          2.350 m Patient Age:    49 years     BP:           122/83 mmHg Patient Gender: M            HR:           82 bpm. Exam Location:  Inpatient Procedure: 2D Echo, Cardiac Doppler and Color Doppler STAT ECHO Indications:    Cardiomyopathy-Ischemic 414.8 / I25.5                 Cardiomyopathy-Unspecified 425.9 / I42.9  History:        Patient has no prior history of Echocardiogram examinations.                 Risk Factors:Hypertension and Dyslipidemia.  Sonographer:    Ross Ludwig RDCS (AE) Referring Phys: [id number] BRADLEY L ICARD IMPRESSIONS  1. Left ventricular ejection fraction, by estimation, is 40 to 45%. The left ventricle has mildly decreased function. The left ventricle demonstrates global hypokinesis. There is moderate left ventricular hypertrophy. Left ventricular diastolic parameters are  consistent with Grade I diastolic dysfunction (impaired relaxation).  2. Right ventricular systolic function is moderately reduced. The right ventricular size is mildly enlarged. There is normal pulmonary artery systolic pressure.  3. The mitral valve is normal in structure. Trivial mitral valve regurgitation. No evidence of mitral stenosis.  4. The aortic valve is normal in structure. Aortic valve regurgitation is not visualized. No aortic stenosis is present.  5. The inferior vena cava is normal in size with greater than 50% respiratory variability, suggesting right atrial pressure of 3 mmHg. FINDINGS  Left Ventricle: Left ventricular ejection fraction, by estimation, is 40 to 45%. The left ventricle has mildly decreased function. The left ventricle demonstrates global hypokinesis. The left ventricular internal cavity size was normal in size. There is  moderate left ventricular hypertrophy. Left ventricular diastolic parameters are consistent with Grade I diastolic dysfunction (impaired relaxation). Right Ventricle: The right ventricular size is mildly enlarged. No increase in right ventricular wall thickness. Right ventricular systolic function is moderately reduced. There is normal pulmonary artery systolic pressure. The tricuspid regurgitant velocity is 2.18 m/s, and with an assumed right atrial pressure of 15 mmHg, the estimated right ventricular systolic pressure is 34.0 mmHg. Left Atrium: Left atrial size was normal in size. Right Atrium: Right atrial size was normal in size. Pericardium: There is no evidence of pericardial effusion. Mitral Valve: The mitral valve is normal in structure. Trivial mitral valve regurgitation. No evidence of mitral valve stenosis. Tricuspid Valve: The tricuspid valve is normal in structure. Tricuspid valve regurgitation is trivial. No evidence of tricuspid stenosis. Aortic Valve: The aortic valve is normal in structure. Aortic valve regurgitation is not visualized. No aortic  stenosis is present. Aortic valve mean gradient measures 3.0 mmHg. Aortic valve peak gradient measures 4.3 mmHg. Aortic valve area, by VTI measures 4.05 cm. Pulmonic Valve: The pulmonic valve was normal in structure. Pulmonic valve regurgitation is trivial. No evidence of pulmonic stenosis. Aorta: The aortic root is normal in size and structure. Venous: The inferior vena cava is normal in size with greater than 50% respiratory variability, suggesting right atrial pressure of 3 mmHg. IAS/Shunts: No atrial level shunt detected by color flow Doppler.  LEFT VENTRICLE PLAX 2D LVIDd:         4.30 cm      Diastology LVIDs:         3.20 cm      LV e' medial:    7.40 cm/s LV PW:         1.80 cm      LV E/e' medial:  11.2 LV IVS:        1.70 cm      LV e' lateral:   8.81 cm/s LVOT diam:     2.50 cm      LV E/e' lateral: 9.4 LV SV:         74 LV SV Index:   31 LVOT Area:     4.91 cm  LV Volumes (MOD) LV vol d, MOD A2C: 194.0 ml LV vol d, MOD A4C: 133.0 ml LV vol s, MOD A2C: 106.0 ml LV vol s, MOD A4C: 77.7 ml LV SV MOD A2C:     88.0 ml LV SV MOD A4C:     133.0 ml LV SV MOD BP:      77.8 ml RIGHT VENTRICLE            IVC RV Basal diam:  4.30 cm    IVC diam: 2.30 cm RV Mid diam:    4.20 cm RV S prime:     9.03 cm/s TAPSE (M-mode): 1.7 cm LEFT ATRIUM             Index       RIGHT ATRIUM           Index LA diam:        4.30 cm 1.83 cm/m  RA Area:     23.60 cm LA Vol (A2C):   73.6 ml 31.33 ml/m RA Volume:   68.30 ml  29.07 ml/m LA Vol (A4C):   70.2 ml 29.88 ml/m LA Biplane Vol: 76.0 ml 32.35 ml/m  AORTIC VALVE AV Area (Vmax):    4.01 cm AV Area (Vmean):   3.77 cm AV Area (VTI):     4.05 cm AV Vmax:           104.00 cm/s AV Vmean:          78.900 cm/s AV VTI:            0.182 m AV Peak Grad:      4.3 mmHg AV Mean Grad:      3.0 mmHg LVOT Vmax:         84.90 cm/s LVOT Vmean:        60.600 cm/s LVOT VTI:          0.150 m LVOT/AV VTI ratio: 0.82  AORTA Ao Root diam: 3.60 cm Ao Asc diam:  3.80 cm MITRAL VALVE                TRICUSPID VALVE MV Area (PHT): 5.66 cm    TR Peak grad:   19.0 mmHg MV Decel Time: 134  msec    TR Vmax:        218.00 cm/s MV E velocity: 82.60 cm/s MV A velocity: 76.50 cm/s  SHUNTS MV E/A ratio:  1.08        Systemic VTI:  0.15 m                            Systemic Diam: 2.50 cm Donato Schultz MD Electronically signed by Donato Schultz MD Signature Date/Time: 10/28/2019/5:11:33 PM    Final     Cardiac Studies   ECHO:  1. Left ventricular ejection fraction, by estimation, is 40 to 45%. The  left ventricle has mildly decreased function. The left ventricle  demonstrates global hypokinesis. There is moderate left ventricular  hypertrophy. Left ventricular diastolic  parameters are consistent with Grade I diastolic dysfunction (impaired  relaxation).  2. Right ventricular systolic function is moderately reduced. The right  ventricular size is mildly enlarged. There is normal pulmonary artery  systolic pressure.  3. The mitral valve is normal in structure. Trivial mitral valve  regurgitation. No evidence of mitral stenosis.  4. The aortic valve is normal in structure. Aortic valve regurgitation is  not visualized. No aortic stenosis is present.  5. The inferior vena cava is normal in size with greater than 50%  respiratory variability, suggesting right atrial pressure of 3 mmHg.  Patient Profile     49 y.o. male with a hx of HTN, HLwho is being seen for the evaluation of elevated troponin at the request of Dr Caleb Popp.  Assessment & Plan    Shock:  Shock of unclear etiology.    Not primarily thought to be cardiogenic.  Off of vasopressors.    On Solu-cortef, Florinef, antibiotics per primary team.   Systolic dysfunction:  Newly diagnosed reduced EF.   EF45%.  Plan is for medical therapy and then repeat echo in the months to come.   My suggestion will be that before starting afterload reduction/beta blocker he needs to be off of IV steroids.   RV dysfuntion:  Plan out patient evaluation to  consider, among other things, hypoventilation obesity syndrome, sleep apnea.    Elevated troponin:  The plan is for an out patient ischemia work up given other acute comorbid findings including AKI.    Troponin is trending down.  Plan as above.    AKI:  Creat continues to improve.      For questions or updates, please contact [facility] Please consult [email or web address] for contact info under Cardiology/STEMI.   Signed, Rollene Rotunda, MD  10/30/2019, 9:50 AM

## 2019-10-30 NOTE — Progress Notes (Addendum)
[facility]  PROGRESS NOTE  MEHUL RUDIN HBZ:[id number] DOB: 1970-07-15 DOA: 10/28/2019 PCP: Erven Colla, DO Admit date - 10/28/2019   Admitting Physician Garner Nash, DO  Outpatient Primary MD for the patient is Erven Colla, DO  LOS - 2 Brief Narrative   Trevor Rollins is a 49 y.o. year old male with medical history significant for obesity, HTN, HLD, asthma who presented on 10/28/2019 with 3 days of nausea, vomiting and diarrhea.  He states he slept and woke up on Thursday morning with intense shivers, feeling feverish, and repeated vomiting episodes of nonbilious nonbloody emesis and diarrhea, with no noted sick contacts.  He was seen in urgent care on Friday overall he had a blood pressure of 85/68 and was encouraged to increase his fluid intake as patient was hesitant to go to ED due to prolonged wait time.  He reports trying to keep up with his fluid losses with Pedialyte, Gatorade and water.  He cannot remember but he thinks he stopped taking his blood pressure medicines given how poorly he felt.  EMS was called at his house given persistent low blood pressure and at that time EMS evaluation was found to have SBP in the 60s for which he was given 3 L of fluid remained hypotensive.  In the ED he was afebrile, tachypneic, noted blood pressure 79/55 with a MAP at his lowest of 57.  Lab work notable for lactic acid of 4.3, sodium 129, creatinine 1.47, CO2 18, AST 89, ALT 61, mag 1.4, Phos 1.8, lipase 64, BNP 568, CBC with WBC of 15.4 and hemoglobin of 12.2 platelets of 148.  Initial troponin of 208.  Covid test negative.  Blood cultures were obtained patient was started on empiric ceftriaxone and azithromycin.  Chest x-ray showed cardiomegaly without overt edema and left lower lobe atelectasis.  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  Patient was admitted to ICU as he was started on peripheral norepinephrine, stress dose steroids, and fluid resuscitation.  Care was transferred to [facility] on 9/19 given significant provement in blood pressure and no longer requiring pressor support.  Overnight on 9/18 patient started on heparin drip due to continued elevation in troponin (230---2268) with a TTE showing EF of 40-45% with global hypokinesis of left ventricle, patient was on aspirin, as well as IV metoprolol 2.5 mg every 6, (with hold parameters for blood pressure/heart rate).  EKG obtained showed no acute ischemic changes. Cardiology evaluated and agreed suspected demand ischemia and recommended medical management and discontinuation of heparin gtt for dvt prophylaxis     Subjective  No chest pain. Hopes to restart his celexa. No SOB or cough. States his last cortisone injection was 2 years ago. Last prednisone dose was last week Monday and Tuesday (30 mg and 20 mg respectively) and prior to that it had been weeks since his last 7 day prednisone taper for his sciatica.  A & P    Elevated troponin, Suspect demand ischemia in the setting of profound hypotension, resolved.  Troponin has now peaked at 2268 and now downtrending.  TTE shows EF of 40-45% with mildly decreased function as well as global hypokinesis.  EKG shows no acute ischemic changes and patient remains without any chest pain.  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  No obstruction on PFTs  did show mild restriction in 2020, has high BMI and concern for potential OSA.  Cardiology does not believe RV dysfunction indicated given her shortness of breath, no dyspnea on exertion or hypoxia to suggest PE and more likely etiology related to significantly low blood pressure on arrival - will start low-dose beta-blocker as recommended by cardiology once able to wean off IV steroids -We will benefit from outpatient sleep study  Shock,multifactorial etiology, resolved.  Suspect being driven mostly by nausea/vomiting and diarrhea with inability to replace fluid losses.  However also ruling out infection.  TTE shows slightly reduced EF with global hypokinesis. Cortisol of 47.  Doubt any adrenal insufficiency as patient was not on a prolonged course of prednisone prior to admission and cortisol level was within normal limits.  Doubt infection has remained afebrile, no leukocytosis and blood cultures unremarkable so far - begin to wean IV stress dose steroids Florinef started on admission and closely monitor blood pressure c -Azithromycin, ceftriaxone, monitor blood cultures -No longer requiring pressor support  Hyperglycemia, steroid-induced.  A1c unremarkable.  Continue to closely monitor CBGs, expect improvement as we wean off IV steroids -Sliding scale insulin as needed  Elevated LFTs, resolved no abdominal pain.  Alk phos/T bili unremarkable.  Hepatitis panel unremarkable   Hypovolemic hyponatremia, moderate, resolved.  in the setting of profound fluid losses, improved with IV fluids and increase oral intake.  Sodium now within normal limits at 136 -We will resume home Celexa  AKI, likely pre renal from shock, improving Baseline creatinine less than one.  1.4 on admission, now downtrending to 1.5, having adequate urinary output -Continue to monitor output, encouraged to continue oral hydration -Avoid nephrotoxins -Monitor BMP  Lactic acidosis, in the setting of profound hypotension,  resolved.  4.3 on admission downtrend to 3.1, last checked 2.5.  Anticipate this is much improved given improvement in blood pressure   Hypophosphatemia now back in normal range after repletion    Hypomagnesemia.  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 Anticipated d/c date:  1-2 days. Barriers to d/c or necessity for inpatient status:  Patient currently on IV stress dose steroids, needs close monitoring of his blood pressure while weaning off IV steroids, and reintroducing blood pressure medications for LV dysfunction gConsults  :  Cardiology  Procedures  :  TTE, 9/18  DVT Prophylaxis  :  Heparin subcutaneous  MDM: The below labs and imaging reports were reviewed and summarized above.  Medication management as above.  Lab Results  Component Value Date   PLT 166 10/30/2019    Diet :  Diet Order            Diet Carb Modified Fluid consistency: Thin; Room service appropriate? Yes  Diet effective now  Inpatient Medications Scheduled Meds: . aspirin  81 mg Oral Daily  . Chlorhexidine Gluconate Cloth  6 each Topical Daily  . fludrocortisone  0.1 mg Oral Daily  . fluticasone  2 spray Each Nare Daily  . heparin injection (subcutaneous)  5,000 Units Subcutaneous Q8H  . hydrocortisone sod succinate (SOLU-CORTEF) inj  50 mg Intravenous Q6H  . influenza vac split quadrivalent PF  0.5 mL Intramuscular Tomorrow-1000  . pantoprazole (PROTONIX) IV  40 mg Intravenous QHS   Continuous Infusions: . sodium chloride 10 mL/hr at 10/29/19 0400  . azithromycin 250 mL/hr at 10/29/19 1800  . cefTRIAXone (ROCEPHIN)  IV Stopped (10/29/19 1935)  .  norepinephrine (LEVOPHED) Adult infusion Stopped (10/28/19 1813)  . thiamine injection 250 mg (10/29/19 1741)   PRN Meds:.acetaminophen, albuterol, docusate sodium, ondansetron (ZOFRAN) IV, polyethylene glycol  Antibiotics  :   Anti-infectives (From admission, onward)   Start     Dose/Rate Route Frequency Ordered Stop   10/28/19 1600  cefTRIAXone (ROCEPHIN) 2 g in sodium chloride 0.9 % 100 mL IVPB        2 g 200 mL/hr over 30 Minutes Intravenous Every 24 hours 10/28/19 1545     10/28/19 1600  azithromycin (ZITHROMAX) 500 mg in sodium chloride 0.9 % 250 mL IVPB        500 mg 250 mL/hr over 60 Minutes Intravenous Every 24 hours 10/28/19 1545         Objective   Vitals:   10/30/19 0732 10/30/19 0800 10/30/19 0850 10/30/19 0900  BP:  (!) 148/117 134/87 (!) 144/98  Pulse:  85 89 84  Resp:  (!) 21 (!) 21 20  Temp: 97.6 F (36.4 C)     TempSrc: Oral     SpO2:  99% 98% 97%  Weight:      Height:        SpO2: 97 %  Wt Readings from Last 3 Encounters:  10/30/19 116.3 kg  09/19/19 108.4 kg  08/29/19 105.7 kg     Intake/Output Summary (Last 24 hours) at 10/30/2019 0933 Last data filed at 10/30/2019 0900 Gross per 24 hour  Intake 358.23 ml  Output 3500 ml  Net -3141.77 ml    Physical Exam:     Awake Alert, Oriented X 3, Normal affect No new F.N deficits,  [place].AT, Normal respiratory effort on room air, CTAB RRR,No Gallops,Rubs or new Murmurs,  +ve B.Sounds, Abd Soft, No tenderness, No rebound, guarding or rigidity. Spinal stimulator in place in right lower abdomen No Cyanosis, No new Rash or bruise     I have personally reviewed the following:   Data Reviewed:  CBC Recent Labs  Lab 10/28/19 1155 10/28/19 1607 10/29/19 0147 10/30/19 0648  WBC 9.3 15.4* 14.9* 16.0*  HGB 11.9* 12.2* 11.7* 11.1*  HCT 37.0* 37.9* 35.2* 34.3*  PLT PLATELET CLUMPS NOTED ON SMEAR, UNABLE TO ESTIMATE 148* 130* 166  MCV 99.5 99.7 96.7 98.0  MCH 32.0 32.1 32.1 31.7  MCHC 32.2 32.2  33.2 32.4  RDW 13.1 13.2 13.2 13.5  LYMPHSABS  --  0.0*  --   --   MONOABS  --  0.2  --   --   EOSABS  --  0.0  --   --   BASOSABS  --  0.0  --   --     Chemistries  Recent Labs  Lab 10/28/19 1155 10/28/19 1607 10/29/19 0147 10/29/19 1247 10/30/19 0648  NA 129* 129* 125* 130* 136  K 3.7 4.9 4.9 4.7  4.4  CL 99 100 99 103 107  CO2 18* 17* 14* 17* 21*  GLUCOSE 181* 174* 256* 253* 207*  BUN 16 20 22* 22* 23*  CREATININE 1.47* 1.66* 1.83* 1.63* 1.50*  CALCIUM 8.2* 7.7* 7.7* 8.2* 8.5*  MG  --  1.4* 1.5*  --  2.5*  AST 87* 89*  --   --  34  ALT 60* 61*  --   --  44  ALKPHOS 54 46  --   --  45  BILITOT 1.4* 1.7*  --   --  0.4   ------------------------------------------------------------------------------------------------------------------ No results for input(s): CHOL, HDL, LDLCALC, TRIG, CHOLHDL, LDLDIRECT in the last 72 hours.  Lab Results  Component Value Date   HGBA1C 5.4 10/29/2019   ------------------------------------------------------------------------------------------------------------------ No results for input(s): TSH, T4TOTAL, T3FREE, THYROIDAB in the last 72 hours.  Invalid input(s): FREET3 ------------------------------------------------------------------------------------------------------------------ No results for input(s): VITAMINB12, FOLATE, FERRITIN, TIBC, IRON, RETICCTPCT in the last 72 hours.  Coagulation profile No results for input(s): INR, PROTIME in the last 168 hours.  No results for input(s): DDIMER in the last 72 hours.  Cardiac Enzymes No results for input(s): CKMB, TROPONINI, MYOGLOBIN in the last 168 hours.  Invalid input(s): CK ------------------------------------------------------------------------------------------------------------------    Component Value Date/Time   BNP 568.1 (H) 10/28/2019 1607    Micro Results Recent Results (from the past 240 hour(s))  Novel Coronavirus, NAA ([facility])     Status: None   Collection Time:  10/27/19 12:13 PM   Specimen: Nasopharyngeal Swab; Nasopharyngeal(NP) swabs in vial transport medium   Nasopharynge  Result Value Ref Range Status   SARS-CoV-2, NAA Not Detected Not Detected Final    Comment: This nucleic acid amplification test was developed and its performance characteristics determined by [employer]. Nucleic acid amplification tests include RT-PCR and TMA. This test has not been FDA cleared or approved. This test has been authorized by FDA under an Emergency Use Authorization (EUA). This test is only authorized for the duration of time the declaration that circumstances exist justifying the authorization of the emergency use of in vitro diagnostic tests for detection of SARS-CoV-2 virus and/or diagnosis of COVID-19 infection under section 564(b)(1) of the Act, 21 U.S.C. 854OEV-0(J) (1), unless the authorization is terminated or revoked sooner. When diagnostic testing is negative, the possibility of a false negative result should be considered in the context of a patient's recent exposures and the presence of clinical signs and symptoms consistent with COVID-19. An individual without symptoms of COVID-19 and who is not shedding SARS-CoV-2 virus wo uld expect to have a negative (not detected) result in this assay.   Culture, blood (single) w Reflex to ID Panel     Status: None (Preliminary result)   Collection Time: 10/28/19 12:16 PM   Specimen: BLOOD  Result Value Ref Range Status   Specimen Description BLOOD RIGHT ANTECUBITAL  Final   Special Requests   Final    BOTTLES DRAWN AEROBIC AND ANAEROBIC Blood Culture adequate volume   Culture   Final    NO GROWTH 1 DAY Performed at [facility], [address], [place], [place] [address]    Report Status PENDING  Incomplete  SARS Coronavirus 2 by RT PCR (hospital order, performed in [facility] hospital lab) Nasopharyngeal Nasopharyngeal Swab     Status: None   Collection Time: 10/28/19 12:33 PM    Specimen: Nasopharyngeal Swab  Result Value Ref Range Status   SARS Coronavirus 2 NEGATIVE NEGATIVE Final    Comment: (NOTE) SARS-CoV-2  target nucleic acids are NOT DETECTED.  The SARS-CoV-2 RNA is generally detectable in upper and lower respiratory specimens during the acute phase of infection. The lowest concentration of SARS-CoV-2 viral copies this assay can detect is 250 copies / mL. A negative result does not preclude SARS-CoV-2 infection and should not be used as the sole basis for treatment or other patient management decisions.  A negative result may occur with improper specimen collection / handling, submission of specimen other than nasopharyngeal swab, presence of viral mutation(s) within the areas targeted by this assay, and inadequate number of viral copies (<250 copies / mL). A negative result must be combined with clinical observations, patient history, and epidemiological information.  Fact Sheet for Patients:   StrictlyIdeas.no  Fact Sheet for Healthcare Providers: BankingDealers.co.za  This test is not yet approved or  cleared by the [place] FDA and has been authorized for detection and/or diagnosis of SARS-CoV-2 by FDA under an Emergency Use Authorization (EUA).  This EUA will remain in effect (meaning this test can be used) for the duration of the COVID-19 declaration under Section 564(b)(1) of the Act, 21 U.S.C. section 360bbb-3(b)(1), unless the authorization is terminated or revoked sooner.  Performed at [facility], [place] [address], [place], [place] [address]   MRSA PCR Screening     Status: None   Collection Time: 10/28/19  5:16 PM   Specimen: Nasal Mucosa; Nasopharyngeal  Result Value Ref Range Status   MRSA by PCR NEGATIVE NEGATIVE Final    Comment:        The GeneXpert MRSA Assay (FDA approved for NASAL specimens only), is one component of a comprehensive MRSA colonization surveillance  program. It is not intended to diagnose MRSA infection nor to guide or monitor treatment for MRSA infections. Performed at [facility], [place] [address], [place], [place] [address]     Radiology Reports CT ABDOMEN PELVIS WO CONTRAST  Result Date: 10/28/2019 CLINICAL DATA:  49 year old male with a history of abdominal pain nausea and vomiting EXAM: CT ABDOMEN AND PELVIS WITHOUT CONTRAST TECHNIQUE: Multidetector CT imaging of the abdomen and pelvis was performed following the standard protocol without IV contrast. COMPARISON:  None. FINDINGS: Lower chest: Atelectasis/scarring at the lung bases. Hepatobiliary: Decreased attenuation of the liver parenchyma. Unremarkable gallbladder. No significant pericholecystic fluid or inflammatory changes. Hazy edema within the fat in the liver hilum, adjacent to the proximal duodenum. Pancreas: Unremarkable pancreas Spleen: Unremarkable spleen Adrenals/Urinary Tract: - Right adrenal gland:  Unremarkable - Left adrenal gland: Unremarkable. - Right kidney: No hydronephrosis, nephrolithiasis, ureteral dilation. Mild perinephric stranding/edema. - Left Kidney: No hydronephrosis, nephrolithiasis, ureteral dilation. Mild perinephric stranding/edema - Urinary Bladder: Urinary bladder relatively decompressed Stomach/Bowel: - Stomach: Unremarkable. - Small bowel: Unremarkable - Appendix: Normal. - Colon: Unremarkable. Vascular/Lymphatic: No significant vascular findings are present. No enlarged abdominal or pelvic lymph nodes. Reproductive: Prostate calcifications. Other: Generator within the anterior right abdominal soft tissues. Electrodes terminate within the canal in the lower thoracic spine. Musculoskeletal: No acute displaced fracture. Vacuum disc phenomenon at L5-S1. 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 IMPRESSION: Nonspecific edema in the hilum of the liver and near the proximal duodenum. Correlation with liver enzymes and pancreatic enzymes may  be useful, and if there is ongoing concern for acute process in the upper abdomen, consideration of contrast-enhanced CT. Negative for bowel obstruction. Steatosis. 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 Electronically Signed   By: Corrie Mckusick D.O.   On: 10/28/2019 14:01  DG Chest Port 1 View  Result Date: 10/29/2019 CLINICAL DATA:  Sepsis EXAM: PORTABLE CHEST 1 VIEW COMPARISON:  10/28/2019; CT abdomen pelvis-10/28/2019 FINDINGS: Grossly unchanged enlarged cardiac silhouette and mediastinal contours. Persistently reduced lung volumes. Grossly unchanged perihilar and bilateral medial basilar heterogeneous opacities. No new focal airspace opacities. No pleural effusion or pneumothorax. No evidence of edema. No acute osseous abnormalities. IMPRESSION: Similar findings cardiomegaly, perihilar and bibasilar atelectasis without superimposed acute cardiopulmonary disease on this AP portable examination. Electronically Signed   By: Sandi Mariscal M.D.   On: 10/29/2019 08:48   DG Chest Port 1 View  Result Date: 10/28/2019 CLINICAL DATA:  Hypotension EXAM: PORTABLE CHEST 1 VIEW COMPARISON:  None. FINDINGS: The cardiomediastinal silhouette is enlarged in contour.Perihilar vascular fullness without overt edema. No pleural effusion. No pneumothorax. LEFT lower lobe atelectasis. Partial visualization of spinal stimulator lead. Visualized abdomen is unremarkable. Multilevel degenerative changes of the thoracic spine. IMPRESSION: Cardiomegaly without overt edema. LEFT lower lobe atelectasis. Electronically Signed   By: Valentino Saxon MD   On: 10/28/2019 12:27   ECHOCARDIOGRAM COMPLETE  Result Date: 10/28/2019    ECHOCARDIOGRAM REPORT   Patient Name:   TAKUMA CIFELLI Harsha Date of Exam: 10/28/2019 Medical Rec #:  [id number]    Height:       73.0 in Accession #:    [id number]   Weight:       246.0 lb Date of Birth:  09-06-1970     BSA:          2.350 m Patient Age:    32 years     BP:           122/83 mmHg  Patient Gender: M            HR:           82 bpm. Exam Location:  Inpatient Procedure: 2D Echo, Cardiac Doppler and Color Doppler STAT ECHO Indications:    Cardiomyopathy-Ischemic 414.8 / I25.5                 Cardiomyopathy-Unspecified 425.9 / I42.9  History:        Patient has no prior history of Echocardiogram examinations.                 Risk Factors:Hypertension and Dyslipidemia.  Sonographer:    Clayton Lefort RDCS (AE) Referring Phys: [id number] [place]  1. Left ventricular ejection fraction, by estimation, is 40 to 45%. The left ventricle has mildly decreased function. The left ventricle demonstrates global hypokinesis. There is moderate left ventricular hypertrophy. Left ventricular diastolic parameters are consistent with Grade I diastolic dysfunction (impaired relaxation).  2. Right ventricular systolic function is moderately reduced. The right ventricular size is mildly enlarged. There is normal pulmonary artery systolic pressure.  3. The mitral valve is normal in structure. Trivial mitral valve regurgitation. No evidence of mitral stenosis.  4. The aortic valve is normal in structure. Aortic valve regurgitation is not visualized. No aortic stenosis is present.  5. The inferior vena cava is normal in size with greater than 50% respiratory variability, suggesting right atrial pressure of 3 mmHg. FINDINGS  Left Ventricle: Left ventricular ejection fraction, by estimation, is 40 to 45%. The left ventricle has mildly decreased function. The left ventricle demonstrates global hypokinesis. The left ventricular internal cavity size was normal in size. There is  moderate left ventricular hypertrophy. Left ventricular diastolic parameters are consistent with Grade I diastolic dysfunction (impaired relaxation). Right Ventricle: The right ventricular size  is mildly enlarged. No increase in right ventricular wall thickness. Right ventricular systolic function is moderately reduced. There is normal  pulmonary artery systolic pressure. The tricuspid regurgitant velocity is 2.18 m/s, and with an assumed right atrial pressure of 15 mmHg, the estimated right ventricular systolic pressure is 81.1 mmHg. Left Atrium: Left atrial size was normal in size. Right Atrium: Right atrial size was normal in size. Pericardium: There is no evidence of pericardial effusion. Mitral Valve: The mitral valve is normal in structure. Trivial mitral valve regurgitation. No evidence of mitral valve stenosis. Tricuspid Valve: The tricuspid valve is normal in structure. Tricuspid valve regurgitation is trivial. No evidence of tricuspid stenosis. Aortic Valve: The aortic valve is normal in structure. Aortic valve regurgitation is not visualized. No aortic stenosis is present. Aortic valve mean gradient measures 3.0 mmHg. Aortic valve peak gradient measures 4.3 mmHg. Aortic valve area, by VTI measures 4.05 cm. Pulmonic Valve: The pulmonic valve was normal in structure. Pulmonic valve regurgitation is trivial. No evidence of pulmonic stenosis. Aorta: The aortic root is normal in size and structure. Venous: The inferior vena cava is normal in size with greater than 50% respiratory variability, suggesting right atrial pressure of 3 mmHg. IAS/Shunts: No atrial level shunt detected by color flow Doppler.  LEFT VENTRICLE PLAX 2D LVIDd:         4.30 cm      Diastology LVIDs:         3.20 cm      LV e' medial:    7.40 cm/s LV PW:         1.80 cm      LV E/e' medial:  11.2 LV IVS:        1.70 cm      LV e' lateral:   8.81 cm/s LVOT diam:     2.50 cm      LV E/e' lateral: 9.4 LV SV:         74 LV SV Index:   31 LVOT Area:     4.91 cm  LV Volumes (MOD) LV vol d, MOD A2C: 194.0 ml LV vol d, MOD A4C: 133.0 ml LV vol s, MOD A2C: 106.0 ml LV vol s, MOD A4C: 77.7 ml LV SV MOD A2C:     88.0 ml LV SV MOD A4C:     133.0 ml LV SV MOD BP:      77.8 ml RIGHT VENTRICLE            IVC RV Basal diam:  4.30 cm    IVC diam: 2.30 cm RV Mid diam:    4.20 cm RV S  prime:     9.03 cm/s TAPSE (M-mode): 1.7 cm LEFT ATRIUM             Index       RIGHT ATRIUM           Index LA diam:        4.30 cm 1.83 cm/m  RA Area:     23.60 cm LA Vol (A2C):   73.6 ml 31.33 ml/m RA Volume:   68.30 ml  29.07 ml/m LA Vol (A4C):   70.2 ml 29.88 ml/m LA Biplane Vol: 76.0 ml 32.35 ml/m  AORTIC VALVE AV Area (Vmax):    4.01 cm AV Area (Vmean):   3.77 cm AV Area (VTI):     4.05 cm AV Vmax:           104.00 cm/s AV Vmean:  78.900 cm/s AV VTI:            0.182 m AV Peak Grad:      4.3 mmHg AV Mean Grad:      3.0 mmHg LVOT Vmax:         84.90 cm/s LVOT Vmean:        60.600 cm/s LVOT VTI:          0.150 m LVOT/AV VTI ratio: 0.82  AORTA Ao Root diam: 3.60 cm Ao Asc diam:  3.80 cm MITRAL VALVE               TRICUSPID VALVE MV Area (PHT): 5.66 cm    TR Peak grad:   19.0 mmHg MV Decel Time: 134 msec    TR Vmax:        218.00 cm/s MV E velocity: 82.60 cm/s MV A velocity: 76.50 cm/s  SHUNTS MV E/A ratio:  1.08        Systemic VTI:  0.15 m                            Systemic Diam: 2.50 cm Candee Furbish MD Electronically signed by Candee Furbish MD Signature Date/Time: 10/28/2019/5:11:33 PM    Final      Time Spent in minutes  30     Desiree Hane M.D on 10/30/2019 at 9:33 AM  To page go to [email or web address] - password [facility]

## 2019-10-30 NOTE — Progress Notes (Signed)
Initial Nutrition Assessment  DOCUMENTATION CODES:   Obesity unspecified  INTERVENTION:  - Heart Healthy diet education provided  - Ensure Max po daily, each supplement provides 150 kcal and 30 grams of protein  - Encourage adequate PO intake  NUTRITION DIAGNOSIS:   Increased nutrient needs related to acute illness as evidenced by estimated needs.  GOAL:   Patient will meet greater than or equal to 90% of their needs  MONITOR:   PO intake, Labs, Weight trends, Supplement acceptance  REASON FOR ASSESSMENT:   Consult Assessment of nutrition requirement/status, Diet education  ASSESSMENT:   49 year old male who presented to the ED on 10/28/19 with N/V/D. PMH of HLD, HTN, asthma. Pt admitted with shock.   Discussed pt with RN and during ICU rounds. Pt off pressors.  Spoke with pt and wife at bedside. Pt reports appetite is improving. He states that he typically eats well and has a good appetite. If pt is at home, he will snack throughout the day (ramen, leftovers). If pt is on the road for work, he may eat a grilled chicken sandwich from a fast food restaurant.  Pt reports that during the pandemic, he gained about 20 lbs due to overeating. Pt reports he then changed his diet and lost the 20 lbs.  RD provided "Heart Healthy Nutrition Therapy" handout from the [employer]. Reviewed patient's dietary recall. Provided examples on ways to decrease sodium and fat intake in diet. Discouraged intake of processed foods and use of salt shaker. Encouraged fresh fruits and vegetables as well as whole grain sources of carbohydrates to maximize fiber intake. Teach back method used.  Medications reviewed and include: solu-cortef, protonix, IV abx, IV thiamine  Labs reviewed: magnesium 2.5  UOP: 3475 ml x 24 hours I/O's: -3.0 L since admit  NUTRITION - FOCUSED PHYSICAL EXAM:    Most Recent Value  Orbital Region No depletion  Upper Arm Region No depletion   Thoracic and Lumbar Region No depletion  Buccal Region No depletion  Temple Region No depletion  Clavicle Bone Region No depletion  Clavicle and Acromion Bone Region No depletion  Scapular Bone Region No depletion  Dorsal Hand No depletion  Patellar Region No depletion  Anterior Thigh Region No depletion  Posterior Calf Region No depletion  Edema (RD Assessment) None  Hair Reviewed  Eyes Reviewed  Mouth Reviewed  Skin Reviewed  Nails Reviewed       Diet Order:   Diet Order            Diet Carb Modified Fluid consistency: Thin; Room service appropriate? Yes  Diet effective now                 EDUCATION NEEDS:   Education needs have been addressed  Skin:  Skin Assessment: Reviewed RN Assessment  Last BM:  10/27/19  Height:   Ht Readings from Last 1 Encounters:  10/28/19 6\' 1"  (1.854 m)    Weight:   Wt Readings from Last 1 Encounters:  10/30/19 116.3 kg    Ideal Body Weight:  83.6 kg  BMI:  Body mass index is 33.83 kg/m.  Estimated Nutritional Needs:   Kcal:  2200-2400  Protein:  110-125 grams  Fluid:  >/= 2.0 L    11/01/19, MS, RD, LDN Inpatient Clinical Dietitian Please see [facility] for contact information.

## 2019-10-31 LAB — BASIC METABOLIC PANEL
Anion gap: 9 (ref 5–15)
BUN: 22 mg/dL — ABNORMAL HIGH (ref 6–20)
CO2: 23 mmol/L (ref 22–32)
Calcium: 8.5 mg/dL — ABNORMAL LOW (ref 8.9–10.3)
Chloride: 106 mmol/L (ref 98–111)
Creatinine, Ser: 1.4 mg/dL — ABNORMAL HIGH (ref 0.61–1.24)
GFR calc Af Amer: >60 mL/min (ref >60)
GFR calc non Af Amer: 58 mL/min — ABNORMAL LOW (ref >60)
Glucose, Bld: 141 mg/dL — ABNORMAL HIGH (ref 70–99)
Potassium: 3.7 mmol/L (ref 3.5–5.1)
Sodium: 138 mmol/L (ref 135–145)

## 2019-10-31 LAB — CBC
HCT: 33.8 % — ABNORMAL LOW (ref 39.0–52.0)
Hemoglobin: 10.9 g/dL — ABNORMAL LOW (ref 13.0–17.0)
MCH: 32 pg (ref 26.0–34.0)
MCHC: 32.2 g/dL (ref 30.0–36.0)
MCV: 99.1 fL (ref 80.0–100.0)
Platelets: 171 10*3/uL (ref 150–400)
RBC: 3.41 MIL/uL — ABNORMAL LOW (ref 4.22–5.81)
RDW: 13.7 % (ref 11.5–15.5)
WBC: 7.7 10*3/uL (ref 4.0–10.5)
nRBC: 0 % (ref 0.0–0.2)

## 2019-10-31 LAB — MAGNESIUM: Magnesium: 1.9 mg/dL (ref 1.7–2.4)

## 2019-10-31 LAB — PHOSPHORUS: Phosphorus: 4.2 mg/dL (ref 2.5–4.6)

## 2019-10-31 MED ORDER — PANTOPRAZOLE SODIUM 40 MG PO TBEC
40.0000 mg | DELAYED_RELEASE_TABLET | Freq: Every day | ORAL | Status: DC
  Administered 2019-10-31: 40 mg via ORAL
  Filled 2019-10-31: qty 1

## 2019-10-31 MED ORDER — PREDNISONE 10 MG PO TABS: 10.0000 mg | ORAL_TABLET | Freq: Every day | ORAL | Status: DC

## 2019-10-31 MED ORDER — PREDNISONE 20 MG PO TABS
30.0000 mg | ORAL_TABLET | Freq: Every day | ORAL | Status: AC
  Administered 2019-11-01: 30 mg via ORAL
  Filled 2019-10-31: qty 1

## 2019-10-31 MED ORDER — PREDNISONE 20 MG PO TABS
20.0000 mg | ORAL_TABLET | Freq: Every day | ORAL | Status: DC
  Filled 2019-10-31: qty 1

## 2019-10-31 MED ORDER — PREDNISONE 20 MG PO TABS
40.0000 mg | ORAL_TABLET | Freq: Every day | ORAL | Status: AC
  Administered 2019-10-31: 40 mg via ORAL
  Filled 2019-10-31: qty 2

## 2019-10-31 MED ORDER — LABETALOL HCL 5 MG/ML IV SOLN
10.0000 mg | INTRAVENOUS | Status: AC | PRN
  Administered 2019-10-31 (×2): 10 mg via INTRAVENOUS
  Filled 2019-10-31 (×2): qty 4

## 2019-10-31 MED ORDER — PREDNISONE 5 MG PO TABS: 5.0000 mg | ORAL_TABLET | Freq: Every day | ORAL | Status: DC

## 2019-10-31 NOTE — Progress Notes (Signed)
Pt transferred from 31M to 6N01. Pt A/Ox4. Oriented to room. VSS.

## 2019-10-31 NOTE — Progress Notes (Signed)
[facility]  PROGRESS NOTE  Trevor Rollins ZOX:[id number] DOB: [birth year] DOA: 10/28/2019 PCP: Erven Colla, DO Admit date - 10/28/2019   Admitting Physician Garner Nash, DO  Outpatient Primary MD for the patient is Erven Colla, DO  LOS - 3 Brief Narrative   Trevor Rollins is a 49 y.o. year old male with medical history significant for obesity, HTN, HLD, asthma who presented on 10/28/2019 with 3 days of nausea, vomiting and diarrhea.  He states he slept and woke up on Thursday morning with intense shivers, feeling feverish, and repeated vomiting episodes of nonbilious nonbloody emesis and diarrhea, with no noted sick contacts.  He was seen in urgent care on Friday overall he had a blood pressure of 85/68 and was encouraged to increase his fluid intake as patient was hesitant to go to ED due to prolonged wait time.  He reports trying to keep up with his fluid losses with Pedialyte, Gatorade and water.  He cannot remember but he thinks he stopped taking his blood pressure medicines given how poorly he felt.  EMS was called at his house given persistent low blood pressure and at that time EMS evaluation was found to have SBP in the 60s for which he was given 3 L of fluid remained hypotensive.  In the ED he was afebrile, tachypneic, noted blood pressure 79/55 with a MAP at his lowest of 57.  Lab work notable for lactic acid of 4.3, sodium 129, creatinine 1.47, CO2 18, AST 89, ALT 61, mag 1.4, Phos 1.8, lipase 64, BNP 568, CBC with WBC of 15.4 and hemoglobin of 12.2 platelets of 148.  Initial troponin of 208.  Covid test negative.  Blood cultures were obtained patient was started on empiric ceftriaxone and azithromycin.  Chest x-ray showed cardiomegaly without overt edema and left lower lobe atelectasis.  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  Patient was admitted to ICU as he was started on peripheral norepinephrine, stress dose steroids, and fluid resuscitation.  Care was transferred to [facility] on 9/19 given significant provement in blood pressure and no longer requiring pressor support.  Overnight on 9/18 patient started on heparin drip due to continued elevation in troponin (230---2268) with a TTE showing EF of 40-45% with global hypokinesis of left ventricle, patient was on aspirin, as well as IV metoprolol 2.5 mg every 6, (with hold parameters for blood pressure/heart rate).  EKG obtained showed no acute ischemic changes. Cardiology evaluated and agreed suspected demand ischemia and recommended medical management and discontinuation of heparin gtt for dvt prophylaxis     Subjective  Continues to deny any chest pain.  No shortness of breath.  No cough.  A & P    Elevated troponin, Suspect demand ischemia in the setting of profound hypotension, resolved.  Troponin  peaked at 2268.  TTE shows EF of 40-45% with mildly decreased function as well as global hypokinesis.  EKG shows no acute ischemic changes and patient remains without any chest pain.  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  No obstruction on PFTs did show mild restriction in 2020, has high BMI and concern for potential OSA.  Cardiology does not believe RV dysfunction indicated given her shortness of breath, no dyspnea on exertion or hypoxia to suggest PE and more likely etiology related to significantly low blood pressure on arrival - hold off on BB while monitoring BP with transition off IV  steroids --not a current ACEi candidate while monitoring his kidney recovery per cardiology -Would benefit from outpatient sleep study  Shock,multifactorial etiology, resolved.  Suspect being driven mostly by nausea/vomiting and diarrhea with inability to replace fluid losses.  However also ruling out infection.  TTE shows slightly reduced EF with global hypokinesis. Cortisol of 47.  Doubt any adrenal insufficiency as patient was not on a prolonged course of prednisone prior to admission and cortisol level was within normal limits.  Doubt infection has remained afebrile, no leukocytosis and blood cultures unremarkable so far - Last dose of Iv steroids stopped this am. Transitioned to prednisone, can continue as a taper off -- Florinef started on admission discontinued -Azithromycin, ceftriaxone, end date 9/22, monitor blood cultures   Hyperglycemia, steroid-induced, improving off IV steroids.  A1c unremarkable.  Continue to closely monitor CBGs,  -Sliding scale insulin as needed  Elevated LFTs, resolved no abdominal pain.  Alk phos/T bili unremarkable.  Hepatitis panel unremarkable   Hypovolemic hyponatremia, moderate, resolved.  in the setting of profound fluid losses, improved with IV fluids and increase oral intake.  Sodium now within normal limits  - resumed his home Celexa  AKI, likely pre renal from shock, improving Baseline creatinine less than one.  1.4 on admission, now downtrending to 1.5, having adequate urinary output -Continue to monitor output, encouraged to continue oral hydration -Avoid nephrotoxins -Monitor BMP  Lactic acidosis, in the setting of profound hypotension, resolved.  4.3 on admission downtrend to 3.1, last checked 2.5.    Hypophosphatemia now back in normal range after repletion    Hypomagnesemia.  Back to normal range after repletion   Chronic back pain with right sided sciatica, stable Has a spinal nerve stimulator in right lower abdomen(revision  2019)  HTN, BP much improved with SBP in 120s-140s -hold home enalipril, until IV steroids weaned off     Family Communication  :  none  Code Status :  FULL Code  Disposition Plan  :  Patient is from home. Anticipated d/c date:  1 day. Barriers to d/c or necessity for inpatient status:  if BP remains stable off IV steroids likely discharge on prednisone taper on 9/22. Will have outpatient follow up with cardiology Consults  :  Cardiology  Procedures  :  TTE, 9/18  DVT Prophylaxis  :  Heparin subcutaneous  MDM: The below labs and imaging reports were reviewed and summarized above.  Medication management as above.  Lab Results  Component Value Date   PLT 171 10/31/2019    Diet :  Diet Order            Diet Carb Modified Fluid consistency: Thin; Room service appropriate? Yes  Diet effective now                  Inpatient Medications Scheduled Meds: . aspirin  81 mg Oral Daily  . Chlorhexidine Gluconate Cloth  6 each Topical Daily  . citalopram  20 mg Oral Daily  . fluticasone  2 spray Each Nare Daily  . heparin injection (subcutaneous)  5,000 Units Subcutaneous Q8H  .  pantoprazole  40 mg Oral QHS  . predniSONE  40 mg Oral Q breakfast  . Ensure Max Protein  11 oz Oral Daily   Continuous Infusions: . sodium chloride 10 mL/hr at 10/29/19 0400  . cefTRIAXone (ROCEPHIN)  IV 2 g (10/31/19 1636)   PRN Meds:.acetaminophen, albuterol, docusate sodium, ondansetron (ZOFRAN) IV, polyethylene glycol  Antibiotics  :   Anti-infectives (From admission, onward)   Start     Dose/Rate Route Frequency Ordered Stop   10/28/19 1600  cefTRIAXone (ROCEPHIN) 2 g in sodium chloride 0.9 % 100 mL IVPB        2 g 200 mL/hr over 30 Minutes Intravenous Every 24 hours 10/28/19 1545 11/02/19 1559   10/28/19 1600  azithromycin (ZITHROMAX) 500 mg in sodium chloride 0.9 % 250 mL IVPB        500 mg 250 mL/hr over 60 Minutes Intravenous Every 24 hours 10/28/19 1545 10/30/19 1840        Objective   Vitals:   10/31/19 0452 10/31/19 0823 10/31/19 1015 10/31/19 1403  BP: (!) 145/98 93/61 (!) 153/97 (!) 152/96  Pulse: 62 70 72 70  Resp: (!) '21 17 18 18  ' Temp: 97.8 F (36.6 C) 98.8 F (37.1 C) 98.3 F (36.8 C) 98.4 F (36.9 C)  TempSrc: Oral Oral Oral Oral  SpO2: 97% 100% 98% 98%  Weight:      Height:        SpO2: 98 %  Wt Readings from Last 3 Encounters:  10/30/19 116.3 kg  09/19/19 108.4 kg  08/29/19 105.7 kg     Intake/Output Summary (Last 24 hours) at 10/31/2019 1801 Last data filed at 10/30/2019 2300 Gross per 24 hour  Intake --  Output 1100 ml  Net -1100 ml    Physical Exam:     Awake Alert, Oriented X 3, Normal affect No new F.N deficits,  [place].AT, Normal respiratory effort on room air, CTAB RRR,No Gallops,Rubs or new Murmurs,  +ve B.Sounds, Abd Soft, No tenderness, No rebound, guarding or rigidity. Spinal stimulator in place in right lower abdomen No Cyanosis, No new Rash or bruise     I have personally reviewed the following:   Data Reviewed:  CBC Recent Labs  Lab 10/28/19 1155 10/28/19 1155 10/28/19 1210 10/28/19 1607 10/29/19 0147 10/30/19 0648 10/31/19 0759  WBC 9.3  --   --  15.4* 14.9* 16.0* 7.7  HGB 11.9*   < > 12.2* 12.2* 11.7* 11.1* 10.9*  HCT 37.0*   < > 36.0* 37.9* 35.2* 34.3* 33.8*  PLT PLATELET CLUMPS NOTED ON SMEAR, UNABLE TO ESTIMATE  --   --  148* 130* 166 171  MCV 99.5  --   --  99.7 96.7 98.0 99.1  MCH 32.0  --   --  32.1 32.1 31.7 32.0  MCHC 32.2  --   --  32.2 33.2 32.4 32.2  RDW 13.1  --   --  13.2 13.2 13.5 13.7  LYMPHSABS  --   --   --  0.0*  --   --   --   MONOABS  --   --   --  0.2  --   --   --   EOSABS  --   --   --  0.0  --   --   --   BASOSABS  --   --   --  0.0  --   --   --    < > = values in this interval not  displayed.    Chemistries  Recent Labs  Lab 10/28/19 1155 10/28/19 1210 10/28/19 1607 10/29/19 0147 10/29/19 1247 10/30/19 0648 10/31/19 0759  NA 129*   < > 129* 125*  130* 136 138  K 3.7   < > 4.9 4.9 4.7 4.4 3.7  CL 99   < > 100 99 103 107 106  CO2 18*   < > 17* 14* 17* 21* 23  GLUCOSE 181*   < > 174* 256* 253* 207* 141*  BUN 16   < > 20 22* 22* 23* 22*  CREATININE 1.47*   < > 1.66* 1.83* 1.63* 1.50* 1.40*  CALCIUM 8.2*   < > 7.7* 7.7* 8.2* 8.5* 8.5*  MG  --   --  1.4* 1.5*  --  2.5* 1.9  AST 87*  --  89*  --   --  34  --   ALT 60*  --  61*  --   --  44  --   ALKPHOS 54  --  46  --   --  45  --   BILITOT 1.4*  --  1.7*  --   --  0.4  --    < > = values in this interval not displayed.   ------------------------------------------------------------------------------------------------------------------ No results for input(s): CHOL, HDL, LDLCALC, TRIG, CHOLHDL, LDLDIRECT in the last 72 hours.  Lab Results  Component Value Date   HGBA1C 5.4 10/29/2019   ------------------------------------------------------------------------------------------------------------------ No results for input(s): TSH, T4TOTAL, T3FREE, THYROIDAB in the last 72 hours.  Invalid input(s): FREET3 ------------------------------------------------------------------------------------------------------------------ No results for input(s): VITAMINB12, FOLATE, FERRITIN, TIBC, IRON, RETICCTPCT in the last 72 hours.  Coagulation profile No results for input(s): INR, PROTIME in the last 168 hours.  No results for input(s): DDIMER in the last 72 hours.  Cardiac Enzymes No results for input(s): CKMB, TROPONINI, MYOGLOBIN in the last 168 hours.  Invalid input(s): CK ------------------------------------------------------------------------------------------------------------------    Component Value Date/Time   BNP 568.1 (H) 10/28/2019 1607    Micro Results Recent Results (from the past 240 hour(s))  Novel Coronavirus, NAA ([facility])     Status: None   Collection Time: 10/27/19 12:13 PM   Specimen: Nasopharyngeal Swab; Nasopharyngeal(NP) swabs in vial transport medium    Nasopharynge  Result Value Ref Range Status   SARS-CoV-2, NAA Not Detected Not Detected Final    Comment: This nucleic acid amplification test was developed and its performance characteristics determined by [employer]. Nucleic acid amplification tests include RT-PCR and TMA. This test has not been FDA cleared or approved. This test has been authorized by FDA under an Emergency Use Authorization (EUA). This test is only authorized for the duration of time the declaration that circumstances exist justifying the authorization of the emergency use of in vitro diagnostic tests for detection of SARS-CoV-2 virus and/or diagnosis of COVID-19 infection under section 564(b)(1) of the Act, 21 U.S.C. 116FBX-0(X) (1), unless the authorization is terminated or revoked sooner. When diagnostic testing is negative, the possibility of a false negative result should be considered in the context of a patient's recent exposures and the presence of clinical signs and symptoms consistent with COVID-19. An individual without symptoms of COVID-19 and who is not shedding SARS-CoV-2 virus wo uld expect to have a negative (not detected) result in this assay.   Culture, blood (single) w Reflex to ID Panel     Status: None (Preliminary result)   Collection Time: 10/28/19 12:16 PM   Specimen: BLOOD  Result Value Ref Range Status  Specimen Description BLOOD RIGHT ANTECUBITAL  Final   Special Requests   Final    BOTTLES DRAWN AEROBIC AND ANAEROBIC Blood Culture adequate volume   Culture   Final    NO GROWTH 3 DAYS Performed at [facility], [address], [place], [place] [address]    Report Status PENDING  Incomplete  SARS Coronavirus 2 by RT PCR (hospital order, performed in [facility] hospital lab) Nasopharyngeal Nasopharyngeal Swab     Status: None   Collection Time: 10/28/19 12:33 PM   Specimen: Nasopharyngeal Swab  Result Value Ref Range Status   SARS Coronavirus 2 NEGATIVE NEGATIVE  Final    Comment: (NOTE) SARS-CoV-2 target nucleic acids are NOT DETECTED.  The SARS-CoV-2 RNA is generally detectable in upper and lower respiratory specimens during the acute phase of infection. The lowest concentration of SARS-CoV-2 viral copies this assay can detect is 250 copies / mL. A negative result does not preclude SARS-CoV-2 infection and should not be used as the sole basis for treatment or other patient management decisions.  A negative result may occur with improper specimen collection / handling, submission of specimen other than nasopharyngeal swab, presence of viral mutation(s) within the areas targeted by this assay, and inadequate number of viral copies (<250 copies / mL). A negative result must be combined with clinical observations, patient history, and epidemiological information.  Fact Sheet for Patients:   StrictlyIdeas.no  Fact Sheet for Healthcare Providers: BankingDealers.co.za  This test is not yet approved or  cleared by the [place] FDA and has been authorized for detection and/or diagnosis of SARS-CoV-2 by FDA under an Emergency Use Authorization (EUA).  This EUA will remain in effect (meaning this test can be used) for the duration of the COVID-19 declaration under Section 564(b)(1) of the Act, 21 U.S.C. section 360bbb-3(b)(1), unless the authorization is terminated or revoked sooner.  Performed at [facility], [place] [address] [place], [place] [address]   MRSA PCR Screening     Status: None   Collection Time: 10/28/19  5:16 PM   Specimen: Nasal Mucosa; Nasopharyngeal  Result Value Ref Range Status   MRSA by PCR NEGATIVE NEGATIVE Final    Comment:        The GeneXpert MRSA Assay (FDA approved for NASAL specimens only), is one component of a comprehensive MRSA colonization surveillance program. It is not intended to diagnose MRSA infection nor to guide or monitor treatment for MRSA  infections. Performed at [facility], [place] [address] [place], [place] [address]     Radiology Reports CT ABDOMEN PELVIS WO CONTRAST  Result Date: 10/28/2019 CLINICAL DATA:  49 year old male with a history of abdominal pain nausea and vomiting EXAM: CT ABDOMEN AND PELVIS WITHOUT CONTRAST TECHNIQUE: Multidetector CT imaging of the abdomen and pelvis was performed following the standard protocol without IV contrast. COMPARISON:  None. FINDINGS: Lower chest: Atelectasis/scarring at the lung bases. Hepatobiliary: Decreased attenuation of the liver parenchyma. Unremarkable gallbladder. No significant pericholecystic fluid or inflammatory changes. Hazy edema within the fat in the liver hilum, adjacent to the proximal duodenum. Pancreas: Unremarkable pancreas Spleen: Unremarkable spleen Adrenals/Urinary Tract: - Right adrenal gland:  Unremarkable - Left adrenal gland: Unremarkable. - Right kidney: No hydronephrosis, nephrolithiasis, ureteral dilation. Mild perinephric stranding/edema. - Left Kidney: No hydronephrosis, nephrolithiasis, ureteral dilation. Mild perinephric stranding/edema - Urinary Bladder: Urinary bladder relatively decompressed Stomach/Bowel: - Stomach: Unremarkable. - Small bowel: Unremarkable - Appendix: Normal. - Colon: Unremarkable. Vascular/Lymphatic: No significant vascular findings are  present. No enlarged abdominal or pelvic lymph nodes. Reproductive: Prostate calcifications. Other: Generator within the anterior right abdominal soft tissues. Electrodes terminate within the canal in the lower thoracic spine. Musculoskeletal: No acute displaced fracture. Vacuum disc phenomenon at L5-S1. 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 IMPRESSION: Nonspecific edema in the hilum of the liver and near the proximal duodenum. Correlation with liver enzymes and pancreatic enzymes may be useful, and if there is ongoing concern for acute process in the upper abdomen, consideration of  contrast-enhanced CT. Negative for bowel obstruction. Steatosis. 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 Electronically Signed   By: Corrie Mckusick D.O.   On: 10/28/2019 14:01   DG Chest Port 1 View  Result Date: 10/29/2019 CLINICAL DATA:  Sepsis EXAM: PORTABLE CHEST 1 VIEW COMPARISON:  10/28/2019; CT abdomen pelvis-10/28/2019 FINDINGS: Grossly unchanged enlarged cardiac silhouette and mediastinal contours. Persistently reduced lung volumes. Grossly unchanged perihilar and bilateral medial basilar heterogeneous opacities. No new focal airspace opacities. No pleural effusion or pneumothorax. No evidence of edema. No acute osseous abnormalities. IMPRESSION: Similar findings cardiomegaly, perihilar and bibasilar atelectasis without superimposed acute cardiopulmonary disease on this AP portable examination. Electronically Signed   By: Sandi Mariscal M.D.   On: 10/29/2019 08:48   DG Chest Port 1 View  Result Date: 10/28/2019 CLINICAL DATA:  Hypotension EXAM: PORTABLE CHEST 1 VIEW COMPARISON:  None. FINDINGS: The cardiomediastinal silhouette is enlarged in contour.Perihilar vascular fullness without overt edema. No pleural effusion. No pneumothorax. LEFT lower lobe atelectasis. Partial visualization of spinal stimulator lead. Visualized abdomen is unremarkable. Multilevel degenerative changes of the thoracic spine. IMPRESSION: Cardiomegaly without overt edema. LEFT lower lobe atelectasis. Electronically Signed   By: Valentino Saxon MD   On: 10/28/2019 12:27   ECHOCARDIOGRAM COMPLETE  Result Date: 10/28/2019    ECHOCARDIOGRAM REPORT   Patient Name:   Trevor Rollins [place] Date of Exam: 10/28/2019 Medical Rec #:  [id number]    Height:       73.0 in Accession #:    [id number]   Weight:       246.0 lb Date of Birth:  Feb 11, 1970     BSA:          2.350 m Patient Age:    31 years     BP:           122/83 mmHg Patient Gender: M            HR:           82 bpm. Exam Location:  Inpatient Procedure: 2D Echo, Cardiac  Doppler and Color Doppler STAT ECHO Indications:    Cardiomyopathy-Ischemic 414.8 / I25.5                 Cardiomyopathy-Unspecified 425.9 / I42.9  History:        Patient has no prior history of Echocardiogram examinations.                 Risk Factors:Hypertension and Dyslipidemia.  Sonographer:    Clayton Lefort RDCS (AE) Referring Phys: [id number] [place]  1. Left ventricular ejection fraction, by estimation, is 40 to 45%. The left ventricle has mildly decreased function. The left ventricle demonstrates global hypokinesis. There is moderate left ventricular hypertrophy. Left ventricular diastolic parameters are consistent with Grade I diastolic dysfunction (impaired relaxation).  2. Right ventricular systolic function is moderately reduced. The right ventricular size is mildly enlarged. There is normal pulmonary artery systolic pressure.  3. The mitral valve  is normal in structure. Trivial mitral valve regurgitation. No evidence of mitral stenosis.  4. The aortic valve is normal in structure. Aortic valve regurgitation is not visualized. No aortic stenosis is present.  5. The inferior vena cava is normal in size with greater than 50% respiratory variability, suggesting right atrial pressure of 3 mmHg. FINDINGS  Left Ventricle: Left ventricular ejection fraction, by estimation, is 40 to 45%. The left ventricle has mildly decreased function. The left ventricle demonstrates global hypokinesis. The left ventricular internal cavity size was normal in size. There is  moderate left ventricular hypertrophy. Left ventricular diastolic parameters are consistent with Grade I diastolic dysfunction (impaired relaxation). Right Ventricle: The right ventricular size is mildly enlarged. No increase in right ventricular wall thickness. Right ventricular systolic function is moderately reduced. There is normal pulmonary artery systolic pressure. The tricuspid regurgitant velocity is 2.18 m/s, and with an assumed  right atrial pressure of 15 mmHg, the estimated right ventricular systolic pressure is 49.1 mmHg. Left Atrium: Left atrial size was normal in size. Right Atrium: Right atrial size was normal in size. Pericardium: There is no evidence of pericardial effusion. Mitral Valve: The mitral valve is normal in structure. Trivial mitral valve regurgitation. No evidence of mitral valve stenosis. Tricuspid Valve: The tricuspid valve is normal in structure. Tricuspid valve regurgitation is trivial. No evidence of tricuspid stenosis. Aortic Valve: The aortic valve is normal in structure. Aortic valve regurgitation is not visualized. No aortic stenosis is present. Aortic valve mean gradient measures 3.0 mmHg. Aortic valve peak gradient measures 4.3 mmHg. Aortic valve area, by VTI measures 4.05 cm. Pulmonic Valve: The pulmonic valve was normal in structure. Pulmonic valve regurgitation is trivial. No evidence of pulmonic stenosis. Aorta: The aortic root is normal in size and structure. Venous: The inferior vena cava is normal in size with greater than 50% respiratory variability, suggesting right atrial pressure of 3 mmHg. IAS/Shunts: No atrial level shunt detected by color flow Doppler.  LEFT VENTRICLE PLAX 2D LVIDd:         4.30 cm      Diastology LVIDs:         3.20 cm      LV e' medial:    7.40 cm/s LV PW:         1.80 cm      LV E/e' medial:  11.2 LV IVS:        1.70 cm      LV e' lateral:   8.81 cm/s LVOT diam:     2.50 cm      LV E/e' lateral: 9.4 LV SV:         74 LV SV Index:   31 LVOT Area:     4.91 cm  LV Volumes (MOD) LV vol d, MOD A2C: 194.0 ml LV vol d, MOD A4C: 133.0 ml LV vol s, MOD A2C: 106.0 ml LV vol s, MOD A4C: 77.7 ml LV SV MOD A2C:     88.0 ml LV SV MOD A4C:     133.0 ml LV SV MOD BP:      77.8 ml RIGHT VENTRICLE            IVC RV Basal diam:  4.30 cm    IVC diam: 2.30 cm RV Mid diam:    4.20 cm RV S prime:     9.03 cm/s TAPSE (M-mode): 1.7 cm LEFT ATRIUM             Index  RIGHT ATRIUM           Index  LA diam:        4.30 cm 1.83 cm/m  RA Area:     23.60 cm LA Vol (A2C):   73.6 ml 31.33 ml/m RA Volume:   68.30 ml  29.07 ml/m LA Vol (A4C):   70.2 ml 29.88 ml/m LA Biplane Vol: 76.0 ml 32.35 ml/m  AORTIC VALVE AV Area (Vmax):    4.01 cm AV Area (Vmean):   3.77 cm AV Area (VTI):     4.05 cm AV Vmax:           104.00 cm/s AV Vmean:          78.900 cm/s AV VTI:            0.182 m AV Peak Grad:      4.3 mmHg AV Mean Grad:      3.0 mmHg LVOT Vmax:         84.90 cm/s LVOT Vmean:        60.600 cm/s LVOT VTI:          0.150 m LVOT/AV VTI ratio: 0.82  AORTA Ao Root diam: 3.60 cm Ao Asc diam:  3.80 cm MITRAL VALVE               TRICUSPID VALVE MV Area (PHT): 5.66 cm    TR Peak grad:   19.0 mmHg MV Decel Time: 134 msec    TR Vmax:        218.00 cm/s MV E velocity: 82.60 cm/s MV A velocity: 76.50 cm/s  SHUNTS MV E/A ratio:  1.08        Systemic VTI:  0.15 m                            Systemic Diam: 2.50 cm [place] Furbish MD Electronically signed by Candee Furbish MD Signature Date/Time: 10/28/2019/5:11:33 PM    Final      Time Spent in minutes  30     Desiree Hane M.D on 10/31/2019 at 6:01 PM  To page go to [email or web address] - password [facility]

## 2019-10-31 NOTE — Progress Notes (Addendum)
Progress Note  Patient Name: Trevor Rollins Date of Encounter: 10/31/2019  [facility] Cardiologist: New  Subjective   Patient feeling better today. He is eating breakfast. No chest pain or sob.   Inpatient Medications    Scheduled Meds: . aspirin  81 mg Oral Daily  . Chlorhexidine Gluconate Cloth  6 each Topical Daily  . citalopram  20 mg Oral Daily  . fluticasone  2 spray Each Nare Daily  . heparin injection (subcutaneous)  5,000 Units Subcutaneous Q8H  . pantoprazole (PROTONIX) IV  40 mg Intravenous QHS  . predniSONE  40 mg Oral Q breakfast  . Ensure Max Protein  11 oz Oral Daily   Continuous Infusions: . sodium chloride 10 mL/hr at 10/29/19 0400  . cefTRIAXone (ROCEPHIN)  IV 2 g (10/30/19 1507)   PRN Meds: acetaminophen, albuterol, docusate sodium, ondansetron (ZOFRAN) IV, polyethylene glycol   Vital Signs    Vitals:   10/31/19 0155 10/31/19 0251 10/31/19 0452 10/31/19 0823  BP: (!) 154/99 (!) 150/100 (!) 145/98 93/61  Pulse: 64 84 62 70  Resp:   (!) 21 17  Temp:   97.8 F (36.6 C) 98.8 F (37.1 C)  TempSrc:   Oral Oral  SpO2:   97% 100%  Weight:      Height:        Intake/Output Summary (Last 24 hours) at 10/31/2019 1008 Last data filed at 10/30/2019 2300 Gross per 24 hour  Intake 530.38 ml  Output 1800 ml  Net -1269.62 ml   Last 3 Weights 10/30/2019 10/29/2019 10/28/2019  Weight (lbs) 256 lb 6.3 oz 261 lb 7.5 oz 246 lb  Weight (kg) 116.3 kg 118.6 kg 111.585 kg      Telemetry    NSR PACs, HR 70s - Personally Reviewed  ECG    No new - Personally Reviewed  Physical Exam   GEN: No acute distress.   Neck: No JVD Cardiac: RRR, no murmurs, rubs, or gallops.  Respiratory: Clear to auscultation bilaterally. GI: Soft, nontender, non-distended  MS: No edema; No deformity. Neuro:  Nonfocal  Psych: Normal affect   Labs    High Sensitivity Troponin:   Recent Labs  Lab 10/28/19 1155 10/28/19 1409 10/29/19 0147 10/29/19 0830  TROPONINIHS 208*  230* 2,268* 1,664*      Chemistry Recent Labs  Lab 10/28/19 1155 10/28/19 1210 10/28/19 1607 10/29/19 0147 10/29/19 1247 10/30/19 0648 10/31/19 0759  NA 129*   < > 129*   < > 130* 136 138  K 3.7   < > 4.9   < > 4.7 4.4 3.7  CL 99   < > 100   < > 103 107 106  CO2 18*   < > 17*   < > 17* 21* 23  GLUCOSE 181*   < > 174*   < > 253* 207* 141*  BUN 16   < > 20   < > 22* 23* 22*  CREATININE 1.47*   < > 1.66*   < > 1.63* 1.50* 1.40*  CALCIUM 8.2*   < > 7.7*   < > 8.2* 8.5* 8.5*  PROT 5.6*  --  5.6*  --   --  6.0*  --   ALBUMIN 2.8*  --  2.8*  --   --  2.6*  --   AST 87*  --  89*  --   --  34  --   ALT 60*  --  61*  --   --  44  --  ALKPHOS 54  --  46  --   --  45  --   BILITOT 1.4*  --  1.7*  --   --  0.4  --   GFRNONAA 55*   < > 47*   < > 48* 54* 58*  GFRAA >60   < > 55*   < > 56* >60 >60  ANIONGAP 12   < > 12   < > 10 8 9    < > = values in this interval not displayed.     Hematology Recent Labs  Lab 10/29/19 0147 10/30/19 0648 10/31/19 0759  WBC 14.9* 16.0* 7.7  RBC 3.64* 3.50* 3.41*  HGB 11.7* 11.1* 10.9*  HCT 35.2* 34.3* 33.8*  MCV 96.7 98.0 99.1  MCH 32.1 31.7 32.0  MCHC 33.2 32.4 32.2  RDW 13.2 13.5 13.7  PLT 130* 166 171    BNP Recent Labs  Lab 10/28/19 1607  BNP 568.1*     DDimer No results for input(s): DDIMER in the last 168 hours.   Radiology    No results found.  Cardiac Studies   ECHO:  1. Left ventricular ejection fraction, by estimation, is 40 to 45%. The  left ventricle has mildly decreased function. The left ventricle  demonstrates global hypokinesis. There is moderate left ventricular  hypertrophy. Left ventricular diastolic  parameters are consistent with Grade I diastolic dysfunction (impaired  relaxation).  2. Right ventricular systolic function is moderately reduced. The right  ventricular size is mildly enlarged. There is normal pulmonary artery  systolic pressure.  3. The mitral valve is normal in structure. Trivial  mitral valve  regurgitation. No evidence of mitral stenosis.  4. The aortic valve is normal in structure. Aortic valve regurgitation is  not visualized. No aortic stenosis is present.  5. The inferior vena cava is normal in size with greater than 50%  respiratory variability, suggesting right atrial pressure of 3 mmHg.  Patient Profile     49 y.o. male  with a hx of HTN, HLwho is being seen for the evaluation ofelevated troponinat the request of Dr 44.  Assessment & Plan    Shock - Unclear etiology but suspected secondary to fluid loss vs infection - Off vasopressors - IV abx per IM  New systolic CHF - EF found to be down to 45% - plan for OP ischemic work-up - Plan for for medical therapy and then repeat echo in a few months - Will need CHF directed therapy   Elevated troponin - HS troponin up to 2,000 - Echo showed decreased EF and global hypokinesis - No chest pain - Plan for OP ischemic work-up given acute issues - Started on Aspirin and IV heparin  RV dysfunction - further work-up as outpatient  AKI - suspected pre-renal - creatinine improving  For questions or updates, please contact [facility] Please consult [email or web address] for contact info under        Signed, Cadence Caleb Popp, PA-C  10/31/2019, 10:08 AM    History and all data above reviewed.  Patient examined.  I agree with the findings as above. He feels well and has been walking in the room. The patient exam reveals COR:RRR  ,  Lungs: Clear  ,  Abd: Positive bowel sounds, no rebound no guarding, Ext No edema  .  All available labs, radiology testing, previous records reviewed. Agree with documented assessment and plan.   Acute systolic HF:  I am not inclined to start beta blocker as his HR  is somewhat low.  I will hold off on ARB/ARNI as his creat is still not at baseline from his AKI.   We can see him back in about 2 weeks to follow up med titration.  I will arrange follow up.  Fayrene Fearing [facility]  11:37  AM  10/31/2019

## 2019-11-01 LAB — CBC
HCT: 36.2 % — ABNORMAL LOW (ref 39.0–52.0)
Hemoglobin: 11.8 g/dL — ABNORMAL LOW (ref 13.0–17.0)
MCH: 32.5 pg (ref 26.0–34.0)
MCHC: 32.6 g/dL (ref 30.0–36.0)
MCV: 99.7 fL (ref 80.0–100.0)
Platelets: 209 10*3/uL (ref 150–400)
RBC: 3.63 MIL/uL — ABNORMAL LOW (ref 4.22–5.81)
RDW: 13.9 % (ref 11.5–15.5)
WBC: 7.1 10*3/uL (ref 4.0–10.5)
nRBC: 0 % (ref 0.0–0.2)

## 2019-11-01 LAB — MAGNESIUM: Magnesium: 1.8 mg/dL (ref 1.7–2.4)

## 2019-11-01 LAB — BASIC METABOLIC PANEL
Anion gap: 9 (ref 5–15)
BUN: 26 mg/dL — ABNORMAL HIGH (ref 6–20)
CO2: 25 mmol/L (ref 22–32)
Calcium: 9.1 mg/dL (ref 8.9–10.3)
Chloride: 108 mmol/L (ref 98–111)
Creatinine, Ser: 1.53 mg/dL — ABNORMAL HIGH (ref 0.61–1.24)
GFR calc Af Amer: >60 mL/min (ref >60)
GFR calc non Af Amer: 52 mL/min — ABNORMAL LOW (ref >60)
Glucose, Bld: 115 mg/dL — ABNORMAL HIGH (ref 70–99)
Potassium: 3.9 mmol/L (ref 3.5–5.1)
Sodium: 142 mmol/L (ref 135–145)

## 2019-11-01 LAB — PHOSPHORUS: Phosphorus: 4.8 mg/dL — ABNORMAL HIGH (ref 2.5–4.6)

## 2019-11-01 MED ORDER — ASPIRIN 81 MG PO CHEW: 81.0000 mg | CHEWABLE_TABLET | Freq: Every day | ORAL | Status: DC

## 2019-11-01 MED ORDER — LABETALOL HCL 5 MG/ML IV SOLN
10.0000 mg | Freq: Once | INTRAVENOUS | Status: AC
  Administered 2019-11-01: 10 mg via INTRAVENOUS
  Filled 2019-11-01: qty 4

## 2019-11-01 MED ORDER — ACETAMINOPHEN 325 MG PO TABS: 650.0000 mg | ORAL_TABLET | ORAL | Status: DC | PRN

## 2019-11-01 MED ORDER — AMLODIPINE BESYLATE 5 MG PO TABS
5.0000 mg | ORAL_TABLET | Freq: Every day | ORAL | 1 refills | Status: DC
Start: 2019-11-01 — End: 2019-11-10

## 2019-11-01 MED ORDER — PREDNISONE 10 MG PO TABS: ORAL_TABLET | ORAL | 0 refills | Status: DC

## 2019-11-01 MED ORDER — AMLODIPINE BESYLATE 5 MG PO TABS
5.0000 mg | ORAL_TABLET | Freq: Every day | ORAL | Status: DC
  Administered 2019-11-01: 5 mg via ORAL

## 2019-11-01 MED ORDER — METHOCARBAMOL 500 MG PO TABS: 500.0000 mg | ORAL_TABLET | Freq: Four times a day (QID) | ORAL | 0 refills | Status: DC | PRN

## 2019-11-01 MED ORDER — PANTOPRAZOLE SODIUM 40 MG PO TBEC
40.0000 mg | DELAYED_RELEASE_TABLET | Freq: Every day | ORAL | 0 refills | Status: DC
Start: 2019-11-01 — End: 2020-01-23

## 2019-11-01 MED ORDER — ONDANSETRON 4 MG PO TBDP: 4.0000 mg | ORAL_TABLET | Freq: Three times a day (TID) | ORAL | 0 refills | Status: DC | PRN

## 2019-11-01 NOTE — TOC Transition Note (Signed)
Transition of Care [facility]) - CM/SW Discharge Note   Patient Details  Name: Trevor Rollins MRN: [id number] Date of Birth: 22-Oct-1970  [facility] CM/SW Contact:  Kermit Balo, RN Phone Number: 11/01/2019, 12:18 PM   Clinical Narrative:    Pt is discharging home with self care. Pt has hospital follow up and transport home.    Final next level of care: Home/Self Care Barriers to Discharge: No Barriers Identified   Patient Goals and CMS Choice        Discharge Placement                       Discharge Plan and Services                                     Social Determinants of Health (SDOH) Interventions     Readmission Risk Interventions No flowsheet data found.

## 2019-11-01 NOTE — Discharge Summary (Signed)
Physician Discharge Summary   Patient ID: Trevor Rollins MRN: [id number] DOB/AGE: [id number] 50 y.o.  Admit date: 10/28/2019 Discharge date: 11/01/2019  Primary Care Physician:  Erven Colla, DO   Recommendations for Outpatient Follow-up:  1. Follow up with PCP in 1-2 weeks 2. Patient has completed IV Rocephin antibiotic dose, today the last dose  Home Health: Currently ambulating in the room without any difficulty Equipment/Devices:   Discharge Condition: stable CODE STATUS: FULL  Diet recommendation: Carb modified diet    Discharge Diagnoses:     . Shock (White), multifactorial, resolved, . Hyponatremia . Acute kidney injury with high anion gap metabolic acidosis . Lactic acidosis . Elevated troponin suspect demand ischemia in the setting of profound hypotension . Nausea and vomiting . Elevated LFTs . New systolic congestive heart failure Hyperglycemia, steroid-induced Elevated LFTs, possibly shocked liver due to shock Hypophosphatemia Chronic back pain with right-sided sciatica Hypertension  Consults:   Cardiology    Allergies:   Allergies  Allergen Reactions  . Penicillins Other (See Comments)    Childhood reaction "redness"  Has patient had a PCN reaction causing immediate rash, facial/tongue/throat swelling, SOB or lightheadedness with hypotension: No Has patient had a PCN reaction causing severe rash involving mucus membranes or skin necrosis: No Has patient had a PCN reaction that required hospitalization: No Has patient had a PCN reaction occurring within the last 10 years: No If all of the above answers are "NO", then may proceed with Cephalosporin use.      DISCHARGE MEDICATIONS: Allergies as of 11/01/2019      Reactions   Penicillins Other (See Comments)   Childhood reaction "redness"  Has patient had a PCN reaction causing immediate rash, facial/tongue/throat swelling, SOB or lightheadedness with hypotension: No Has patient had a PCN reaction  causing severe rash involving mucus membranes or skin necrosis: No Has patient had a PCN reaction that required hospitalization: No Has patient had a PCN reaction occurring within the last 10 years: No If all of the above answers are "NO", then may proceed with Cephalosporin use.      Medication List    STOP taking these medications   diazepam 5 MG tablet Commonly known as: VALIUM   enalapril 10 MG tablet Commonly known as: VASOTEC   HYDROcodone-acetaminophen 5-325 MG tablet Commonly known as: NORCO/VICODIN   predniSONE 5 MG (21) Tbpk tablet Commonly known as: STERAPRED UNI-PAK 21 TAB Replaced by: predniSONE 10 MG tablet   tiZANidine 4 MG tablet Commonly known as: ZANAFLEX     TAKE these medications   acetaminophen 325 MG tablet Commonly known as: TYLENOL Take 2 tablets (650 mg total) by mouth every 4 (four) hours as needed for mild pain, moderate pain, fever or headache.   albuterol 108 (90 Base) MCG/ACT inhaler Commonly known as: VENTOLIN HFA INHALE 4 PUFFS INTO THE LUNGS EVERY 4-6 HOURS AS NEEDED FOR UP TO 30 DAYS. What changed:   how much to take  how to take this  when to take this  reasons to take this  additional instructions   amLODipine 5 MG tablet Commonly known as: NORVASC Take 1 tablet (5 mg total) by mouth daily.   aspirin 81 MG chewable tablet Chew 1 tablet (81 mg total) by mouth daily. Start taking on: November 02, 2019   Benadryl Allergy 25 MG tablet Generic drug: diphenhydrAMINE Take 25 mg by mouth every 6 (six) hours as needed for allergies.   citalopram 20 MG tablet Commonly known as: CELEXA Take 1  tablet (20 mg total) by mouth daily.   Flovent HFA 110 MCG/ACT inhaler Generic drug: fluticasone Inhale 2 puffs into the lungs 2 (two) times daily.   fluticasone 50 MCG/ACT nasal spray Commonly known as: FLONASE Place 2 sprays into both nostrils daily. What changed:   when to take this  reasons to take this   gabapentin 300 MG  capsule Commonly known as: NEURONTIN Take 300 mg by mouth 3 (three) times daily.   methocarbamol 500 MG tablet Commonly known as: Robaxin Take 1 tablet (500 mg total) by mouth every 6 (six) hours as needed for muscle spasms.   ondansetron 4 MG disintegrating tablet Commonly known as: Zofran ODT Take 1 tablet (4 mg total) by mouth every 8 (eight) hours as needed for nausea or vomiting.   pantoprazole 40 MG tablet Commonly known as: PROTONIX Take 1 tablet (40 mg total) by mouth at bedtime.   predniSONE 10 MG tablet Commonly known as: DELTASONE Prednsione taper, take 94m (2 tabs) PO on 11/02/19, then taper to 158m(1 tab) on 11/03/19, then 36m136m1/2 tab) on 11/04/19, then off. Replaces: predniSONE 5 MG (21) Tbpk tablet        Brief H and P: For complete details please refer to admission H and P, but in brief BreCLAVIN RUHLMAN a [age]o. year old male with medical history significant for obesity, HTN, HLD, asthma who presented on 10/28/2019 with 3 days of nausea, vomiting and diarrhea.  He states he slept and woke up on Thursday morning on 10/26/19 with intense shivers, feeling feverish, and repeated vomiting episodes of nonbilious nonbloody emesis and diarrhea, with no noted sick contacts.  He was seen in urgent care next days, he had a blood pressure of 85/68 and was encouraged to increase his fluid intake as patient was hesitant to go to ED due to prolonged wait time.  He reported trying to keep up with his fluid losses with Pedialyte, Gatorade and water.  He cannot remember but he thinks he stopped taking his blood pressure medicines given how poorly he felt.  EMS was called at his house given persistent low blood pressure and at that time EMS evaluation was found to have SBP in the 60s for which he was given 3 L of fluid remained hypotensive.  In the ED he was afebrile, tachypneic, noted blood pressure 79/55 with a MAP at his lowest of 57.  Lab work notable for lactic acid of 4.3, sodium 129,  creatinine 1.47, CO2 18, AST 89, ALT 61, mag 1.4, Phos 1.8, lipase 64, BNP 568, CBC with WBC of 15.4 and hemoglobin of 12.2 platelets of 148.  Initial troponin of 208.  Covid test negative.  Blood cultures were obtained patient was started on empiric ceftriaxone and azithromycin.  Chest x-ray showed cardiomegaly without overt edema and left lower lobe atelectasis.  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  Patient was admitted to ICU as he was started on peripheral norepinephrine, stress dose steroids, and fluid resuscitation. [facility] given significant provement in blood pressure and no longer requiring pressor support.  TTE showing EF of 40-45% with global hypokinesis of left ventricle, patient was on aspirin, as well as IV metoprolol 2.5 mg every 6, (with hold parameters for blood pressure/heart rate).  EKG obtained showed no acute ischemic changes.  Cardiology evaluated and agreed suspected demand ischemia.    Hospital Course:   Elevated troponin, Suspect demand ischemia in the setting of profound hypotension, resolved.   - Troponin  peaked at 2268. -2D echo showed EF of 40 to 45% with mildly decreased function, global hypokinesis.  -  EKG shows no acute ischemic changes and patient remains without any chest pain. -Cardiology was consulted, recommended 3 months of medical therapy and repeat 2D echo.  If EF is still depressed, plan for ischemic evaluation at that time. -Patient was placed on aspirin, ACE inhibitor held due to AKI. -Patient was started on aspirin, patient was followed closely by cardiology, recommended to hold off on ARB/ARN I as creatinine is still not at baseline.  Not inclined to start beta-blocker as his heart rate is somewhat low.  Follow-up outpatient in 2 weeks and med titration.  Follow-up has  been arranged with Dr. Percival Spanish.  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  - No obstruction on PFTs did show mild restriction in 2020, has high BMI and concern for potential OSA.  - Per cardiology, hold off on ACE/ARB due to AKI, creatinine currently not at baseline.  Hold off on beta-blockers as heart rate somewhat low -Would benefit from outpatient sleep study  Essential hypertension - BP has been now elevated, however heart rate is in's low 60s - will place on Norvasc 5 mg daily, outpatient up titration/adjustment of the medications by Dr. Percival Spanish   Shock,multifactorial etiology, resolved.   -Suspect being driven mostly by nausea/vomiting and diarrhea with inability to replace fluid losses.  However also ruling out infection.  - TTE shows slightly reduced EF with global hypokinesis. Cortisol of 47.  Doubt any adrenal insufficiency as patient was not on a prolonged course of prednisone prior to admission and cortisol level was within normal limits.  Doubt infection has remained afebrile, no leukocytosis and blood cultures unremarkable so far - Patient was tapered off of the IV steroids on 9/21, transition to oral prednisone with quick taper outpatient -Azithromycin, ceftriaxone, end date 9/22, completed before discharge.    Hyperglycemia, steroid-induced, improving off IV steroids.  A1c unremarkable.   CBGs improving  Elevated LFTs, resolved  -Likely due to shock, hepatitis panel unremarkable.    Alk phos/T bili unremarkable.  Hepatitis panel unremarkable   Hypovolemic hyponatremia, moderate, resolved. -in the setting of profound fluid losses, improved with IV fluids and increase oral intake.  -Sodium now within normal limits, sodium 142 at the time of discharge. - resumed his home Celexa  AKI, likely pre renal from shock,   -Improving, baseline creatinine 1 -Creatinine currently 1.5, has been between 1.4-1.6 during hospitalization.  -Continue to hold ACE inhibitor at the time of discharge.  Outpatient follow-up, repeat BMET  Lactic acidosis, in the setting of profound hypotension, resolved.   -4.3 on admission downtrend to 3.1, last checked 2.4 on 9/19  Hypophosphatemia - now back in normal range after repletion, phosphorus 4.8 on 9/22   Hypomagnesemia. -Magnesium stable, 1.8 on 9/22     Chronic back pain with right sided sciatica, stable Has a spinal nerve stimulator in right lower abdomen(revision 2019), outpatient follow-up with Dr. Luna Glasgow    Day of Discharge S: Ambulating in the room, no acute complaints, hoping to be discharged today.  No fevers or chills.  BP (!) 166/100 (BP Location: Left Arm)   Pulse 60   Temp Marland Kitchen)  97.5 F (36.4 C)   Resp 20   Ht '6\' 1"'  (1.854 m)   Wt 116.3 kg   SpO2 99%   BMI 33.83 kg/m   Physical Exam: General: Alert and awake oriented x3 not in any acute distress. HEENT: anicteric sclera, pupils reactive to light and accommodation CVS: S1-S2 clear no murmur rubs or gallops Chest: clear to auscultation bilaterally, no wheezing rales or rhonchi Abdomen: soft nontender, nondistended, normal bowel sounds Extremities: no cyanosis, clubbing or edema noted bilaterally Neuro: Cranial nerves II-XII intact, no focal neurological deficits    Get Medicines reviewed and adjusted: Please take all your medications with you for your next visit with your Primary MD  Please request your Primary MD to go over all hospital tests and procedure/radiological results at the follow up. Please ask your Primary MD to get all Hospital records sent to his/her office.  If you experience worsening of your admission symptoms, develop shortness of breath, life threatening emergency, suicidal or homicidal thoughts you must seek medical attention immediately by calling 911 or calling  your MD immediately  if symptoms less severe.  You must read complete instructions/literature along with all the possible adverse reactions/side effects for all the Medicines you take and that have been prescribed to you. Take any new Medicines after you have completely understood and accept all the possible adverse reactions/side effects.   Do not drive when taking pain medications.   Do not take more than prescribed Pain, Sleep and Anxiety Medications  Special Instructions: If you have smoked or chewed Tobacco  in the last 2 yrs please stop smoking, stop any regular Alcohol  and or any Recreational drug use.  Wear Seat belts while driving.  Please note  You were cared for by a hospitalist during your hospital stay. Once you are discharged, your primary care physician will handle any further medical issues. Please note that NO REFILLS for any discharge medications will be authorized once you are discharged, as it is imperative that you return to your primary care physician (or establish a relationship with a primary care physician if you do not have one) for your aftercare needs so that they can reassess your need for medications and monitor your lab values.   The results of significant diagnostics from this hospitalization (including imaging, microbiology, ancillary and laboratory) are listed below for reference.      Procedures/Studies:  CT ABDOMEN PELVIS WO CONTRAST  Result Date: 10/28/2019 CLINICAL DATA:  49 year old male with a history of abdominal pain nausea and vomiting EXAM: CT ABDOMEN AND PELVIS WITHOUT CONTRAST TECHNIQUE: Multidetector CT imaging of the abdomen and pelvis was performed following the standard protocol without IV contrast. COMPARISON:  None. FINDINGS: Lower chest: Atelectasis/scarring at the lung bases. Hepatobiliary: Decreased attenuation of the liver parenchyma. Unremarkable gallbladder. No significant pericholecystic fluid or inflammatory changes. Hazy edema  within the fat in the liver hilum, adjacent to the proximal duodenum. Pancreas: Unremarkable pancreas Spleen: Unremarkable spleen Adrenals/Urinary Tract: - Right adrenal gland:  Unremarkable - Left adrenal gland: Unremarkable. - Right kidney: No hydronephrosis, nephrolithiasis, ureteral dilation. Mild perinephric stranding/edema. - Left Kidney: No hydronephrosis, nephrolithiasis, ureteral dilation. Mild perinephric stranding/edema - Urinary Bladder: Urinary bladder relatively decompressed Stomach/Bowel: - Stomach: Unremarkable. - Small bowel: Unremarkable - Appendix: Normal. - Colon: Unremarkable. Vascular/Lymphatic: No significant vascular findings are present. No enlarged abdominal or pelvic lymph nodes. Reproductive: Prostate calcifications. Other: Generator within the anterior right abdominal soft tissues. Electrodes terminate within the canal in the lower thoracic spine. Musculoskeletal:  No acute displaced fracture. Vacuum disc phenomenon at L5-S1. 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 IMPRESSION: Nonspecific edema in the hilum of the liver and near the proximal duodenum. Correlation with liver enzymes and pancreatic enzymes may be useful, and if there is ongoing concern for acute process in the upper abdomen, consideration of contrast-enhanced CT. Negative for bowel obstruction. Steatosis. 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 Electronically Signed   By: Corrie Mckusick D.O.   On: 10/28/2019 14:01   DG Chest Port 1 View  Result Date: 10/29/2019 CLINICAL DATA:  Sepsis EXAM: PORTABLE CHEST 1 VIEW COMPARISON:  10/28/2019; CT abdomen pelvis-10/28/2019 FINDINGS: Grossly unchanged enlarged cardiac silhouette and mediastinal contours. Persistently reduced lung volumes. Grossly unchanged perihilar and bilateral medial basilar heterogeneous opacities. No new focal airspace opacities. No pleural effusion or pneumothorax. No evidence of edema. No acute osseous abnormalities.  IMPRESSION: Similar findings cardiomegaly, perihilar and bibasilar atelectasis without superimposed acute cardiopulmonary disease on this AP portable examination. Electronically Signed   By: Sandi Mariscal M.D.   On: 10/29/2019 08:48   DG Chest Port 1 View  Result Date: 10/28/2019 CLINICAL DATA:  Hypotension EXAM: PORTABLE CHEST 1 VIEW COMPARISON:  None. FINDINGS: The cardiomediastinal silhouette is enlarged in contour.Perihilar vascular fullness without overt edema. No pleural effusion. No pneumothorax. LEFT lower lobe atelectasis. Partial visualization of spinal stimulator lead. Visualized abdomen is unremarkable. Multilevel degenerative changes of the thoracic spine. IMPRESSION: Cardiomegaly without overt edema. LEFT lower lobe atelectasis. Electronically Signed   By: Valentino Saxon MD   On: 10/28/2019 12:27   ECHOCARDIOGRAM COMPLETE  Result Date: 10/28/2019    ECHOCARDIOGRAM REPORT   Patient Name:   KAELON WEEKES [place] Date of Exam: 10/28/2019 Medical Rec #:  [id number]    Height:       73.0 in Accession #:    [id number]   Weight:       246.0 lb Date of Birth:  01/15/1971     BSA:          2.350 m Patient Age:    50 years     BP:           122/83 mmHg Patient Gender: M            HR:           82 bpm. Exam Location:  Inpatient Procedure: 2D Echo, Cardiac Doppler and Color Doppler STAT ECHO Indications:    Cardiomyopathy-Ischemic 414.8 / I25.5                 Cardiomyopathy-Unspecified 425.9 / I42.9  History:        Patient has no prior history of Echocardiogram examinations.                 Risk Factors:Hypertension and Dyslipidemia.  Sonographer:    Clayton Lefort RDCS (AE) Referring Phys: [id number] [place]  1. Left ventricular ejection fraction, by estimation, is 40 to 45%. The left ventricle has mildly decreased function. The left ventricle demonstrates global hypokinesis. There is moderate left ventricular hypertrophy. Left ventricular diastolic parameters are consistent with Grade I diastolic  dysfunction (impaired relaxation).  2. Right ventricular systolic function is moderately reduced. The right ventricular size is mildly enlarged. There is normal pulmonary artery systolic pressure.  3. The mitral valve is normal in structure. Trivial mitral valve regurgitation. No evidence of mitral stenosis.  4. The aortic valve is normal in structure. Aortic valve regurgitation is not visualized. No aortic stenosis  is present.  5. The inferior vena cava is normal in size with greater than 50% respiratory variability, suggesting right atrial pressure of 3 mmHg. FINDINGS  Left Ventricle: Left ventricular ejection fraction, by estimation, is 40 to 45%. The left ventricle has mildly decreased function. The left ventricle demonstrates global hypokinesis. The left ventricular internal cavity size was normal in size. There is  moderate left ventricular hypertrophy. Left ventricular diastolic parameters are consistent with Grade I diastolic dysfunction (impaired relaxation). Right Ventricle: The right ventricular size is mildly enlarged. No increase in right ventricular wall thickness. Right ventricular systolic function is moderately reduced. There is normal pulmonary artery systolic pressure. The tricuspid regurgitant velocity is 2.18 m/s, and with an assumed right atrial pressure of 15 mmHg, the estimated right ventricular systolic pressure is 22.9 mmHg. Left Atrium: Left atrial size was normal in size. Right Atrium: Right atrial size was normal in size. Pericardium: There is no evidence of pericardial effusion. Mitral Valve: The mitral valve is normal in structure. Trivial mitral valve regurgitation. No evidence of mitral valve stenosis. Tricuspid Valve: The tricuspid valve is normal in structure. Tricuspid valve regurgitation is trivial. No evidence of tricuspid stenosis. Aortic Valve: The aortic valve is normal in structure. Aortic valve regurgitation is not visualized. No aortic stenosis is present. Aortic valve  mean gradient measures 3.0 mmHg. Aortic valve peak gradient measures 4.3 mmHg. Aortic valve area, by VTI measures 4.05 cm. Pulmonic Valve: The pulmonic valve was normal in structure. Pulmonic valve regurgitation is trivial. No evidence of pulmonic stenosis. Aorta: The aortic root is normal in size and structure. Venous: The inferior vena cava is normal in size with greater than 50% respiratory variability, suggesting right atrial pressure of 3 mmHg. IAS/Shunts: No atrial level shunt detected by color flow Doppler.  LEFT VENTRICLE PLAX 2D LVIDd:         4.30 cm      Diastology LVIDs:         3.20 cm      LV e' medial:    7.40 cm/s LV PW:         1.80 cm      LV E/e' medial:  11.2 LV IVS:        1.70 cm      LV e' lateral:   8.81 cm/s LVOT diam:     2.50 cm      LV E/e' lateral: 9.4 LV SV:         74 LV SV Index:   31 LVOT Area:     4.91 cm  LV Volumes (MOD) LV vol d, MOD A2C: 194.0 ml LV vol d, MOD A4C: 133.0 ml LV vol s, MOD A2C: 106.0 ml LV vol s, MOD A4C: 77.7 ml LV SV MOD A2C:     88.0 ml LV SV MOD A4C:     133.0 ml LV SV MOD BP:      77.8 ml RIGHT VENTRICLE            IVC RV Basal diam:  4.30 cm    IVC diam: 2.30 cm RV Mid diam:    4.20 cm RV S prime:     9.03 cm/s TAPSE (M-mode): 1.7 cm LEFT ATRIUM             Index       RIGHT ATRIUM           Index LA diam:        4.30 cm 1.83 cm/m  RA Area:  23.60 cm LA Vol (A2C):   73.6 ml 31.33 ml/m RA Volume:   68.30 ml  29.07 ml/m LA Vol (A4C):   70.2 ml 29.88 ml/m LA Biplane Vol: 76.0 ml 32.35 ml/m  AORTIC VALVE AV Area (Vmax):    4.01 cm AV Area (Vmean):   3.77 cm AV Area (VTI):     4.05 cm AV Vmax:           104.00 cm/s AV Vmean:          78.900 cm/s AV VTI:            0.182 m AV Peak Grad:      4.3 mmHg AV Mean Grad:      3.0 mmHg LVOT Vmax:         84.90 cm/s LVOT Vmean:        60.600 cm/s LVOT VTI:          0.150 m LVOT/AV VTI ratio: 0.82  AORTA Ao Root diam: 3.60 cm Ao Asc diam:  3.80 cm MITRAL VALVE               TRICUSPID VALVE MV Area (PHT): 5.66  cm    TR Peak grad:   19.0 mmHg MV Decel Time: 134 msec    TR Vmax:        218.00 cm/s MV E velocity: 82.60 cm/s MV A velocity: 76.50 cm/s  SHUNTS MV E/A ratio:  1.08        Systemic VTI:  0.15 m                            Systemic Diam: 2.50 cm Candee Furbish MD Electronically signed by Candee Furbish MD Signature Date/Time: 10/28/2019/5:11:33 PM    Final       LAB RESULTS: Basic Metabolic Panel: Recent Labs  Lab 10/31/19 0759 10/31/19 0759 11/01/19 0455  NA 138  --  142  K 3.7  --  3.9  CL 106  --  108  CO2 23  --  25  GLUCOSE 141*  --  115*  BUN 22*  --  26*  CREATININE 1.40*  --  1.53*  CALCIUM 8.5*  --  9.1  MG 1.9   < > 1.8  PHOS 4.2   < > 4.8*   < > = values in this interval not displayed.   Liver Function Tests: Recent Labs  Lab 10/28/19 1607 10/30/19 0648  AST 89* 34  ALT 61* 44  ALKPHOS 46 45  BILITOT 1.7* 0.4  PROT 5.6* 6.0*  ALBUMIN 2.8* 2.6*   Recent Labs  Lab 10/28/19 1607  LIPASE 64*  AMYLASE 80   No results for input(s): AMMONIA in the last 168 hours. CBC: Recent Labs  Lab 10/28/19 1607 10/29/19 0147 10/31/19 0759 10/31/19 0759 11/01/19 0455  WBC 15.4*   < > 7.7  --  7.1  NEUTROABS 15.2*  --   --   --   --   HGB 12.2*   < > 10.9*  --  11.8*  HCT 37.9*   < > 33.8*  --  36.2*  MCV 99.7   < > 99.1   < > 99.7  PLT 148*   < > 171  --  209   < > = values in this interval not displayed.   Cardiac Enzymes: No results for input(s): CKTOTAL, CKMB, CKMBINDEX, TROPONINI in the last 168 hours. BNP: Invalid input(s): POCBNP CBG: Recent  Labs  Lab 10/28/19 1237  GLUCAP 158*       Disposition and Follow-up: Discharge Instructions    Diet - low sodium heart healthy   Complete by: As directed    Increase activity slowly   Complete by: As directed        DISPOSITION: [place], [place], DO. Schedule an appointment as soon as possible for a visit in 2 week(s).   Specialty: Family  Medicine Contact information: [place] [address] [phone]                Time coordinating discharge:  35 minutes  Signed:   Estill Cotta M.D. [facility] 11/01/2019, 11:47 AM

## 2019-11-01 NOTE — Progress Notes (Signed)
[name] be D/C'd per MD order. Discussed with the patient and all questions fully answered. ? VSS, Skin clean, dry and intact without evidence of skin break down, no evidence of skin tears noted. ? IV catheter discontinued intact. Site without signs and symptoms of complications. Dressing and pressure applied. ? An After Visit Summary was printed and given to the patient. Patient informed where to pickup prescriptions. ? D/c education completed with patient/family including follow up instructions, medication list, d/c activities limitations if indicated, with other d/c instructions as indicated by MD - patient able to verbalize understanding, all questions fully answered.  ? Patient instructed to return to ED, call 911, or call MD for any changes in condition.  ? Patient to be escorted via WC, and D/C home via private auto.

## 2019-11-02 ENCOUNTER — Other Ambulatory Visit: Payer: Self-pay | Admitting: Allergy & Immunology

## 2019-11-02 LAB — CULTURE, BLOOD (SINGLE)
Culture: NO GROWTH
Special Requests: ADEQUATE

## 2019-11-10 ENCOUNTER — Encounter: Payer: Self-pay | Admitting: Family Medicine

## 2019-11-10 ENCOUNTER — Ambulatory Visit (INDEPENDENT_AMBULATORY_CARE_PROVIDER_SITE_OTHER): Payer: BC Managed Care – PPO | Admitting: Family Medicine

## 2019-11-10 ENCOUNTER — Other Ambulatory Visit: Payer: Self-pay

## 2019-11-10 VITALS — BP 130/76 | HR 104 | Temp 97.5°F | Ht 73.0 in | Wt 242.0 lb

## 2019-11-10 DIAGNOSIS — N179 Acute kidney failure, unspecified: Secondary | ICD-10-CM

## 2019-11-10 DIAGNOSIS — I10 Essential (primary) hypertension: Secondary | ICD-10-CM

## 2019-11-10 DIAGNOSIS — Z8619 Personal history of other infectious and parasitic diseases: Secondary | ICD-10-CM | POA: Diagnosis not present

## 2019-11-10 DIAGNOSIS — G9050 Complex regional pain syndrome I, unspecified: Secondary | ICD-10-CM

## 2019-11-10 DIAGNOSIS — Z09 Encounter for follow-up examination after completed treatment for conditions other than malignant neoplasm: Secondary | ICD-10-CM | POA: Diagnosis not present

## 2019-11-10 MED ORDER — METHOCARBAMOL 500 MG PO TABS: 500.0000 mg | ORAL_TABLET | Freq: Four times a day (QID) | ORAL | 2 refills | Status: DC | PRN

## 2019-11-10 MED ORDER — AMLODIPINE BESYLATE 5 MG PO TABS
5.0000 mg | ORAL_TABLET | Freq: Every day | ORAL | 1 refills | Status: DC
Start: 2019-11-10 — End: 2020-04-18

## 2019-11-10 NOTE — Progress Notes (Signed)
Patient ID: Trevor Rollins, male    DOB: 01-Feb-1971, 49 y.o.   MRN: [id number]   Chief Complaint  Patient presents with  . Hospitalization Follow-up   Subjective:    HPI  hospital follow up. Admitted on 10/28/19- and dc on 11/01/19.  Dx with Shock 2nd to diarrhea/vomiting/dehydration.  Since discharge from hosp-When asleep and getting up too quick then notices he is "blacking-out." Needing to get up slowly and is helping. Vomiting and diarrhea and went into shock with low pressure. 85/65 at urgent care and sent to ER. Drank lots of gatorade and 5 large glasses of water.  Pt had IV epi to get his bp back up. Had about 5L fluids given. Liver improved, whiel in hosp. Cr 1.9 down to 1.4 at this time.    Had cortisone inject on rt hip this week.  Seeing Dr. Luna Glasgow, ortho. Stopped the tizanadine and started on robaxin per his ortho. Getting prednisone from ortho prn for back pain.  Going to heart healthy diet,  Cut back 1/2 on wine.  When traveling out of country uses valium and percocet 32m tablets for his chronic back pain and RSD in rt lower leg and CRPS on rt foot.  Has had same bottle for years.  Only taking a few at a time.  Medical History BLundenhas a past medical history of Allergy, Anxiety, Asthma, DDD (degenerative disc disease), Deafness in left ear, ED (erectile dysfunction), Hyperlipidemia, Hypertension, Hypertriglyceridemia, and Insomnia.   Outpatient Encounter Medications as of 11/10/2019  Medication Sig  . albuterol (VENTOLIN HFA) 108 (90 Base) MCG/ACT inhaler INHALE 4 PUFFS INTO THE LUNGS EVERY 4-6 HOURS AS NEEDED FOR UP TO 30 DAYS.  .Marland KitchenamLODipine (NORVASC) 5 MG tablet Take 1 tablet (5 mg total) by mouth daily.  . citalopram (CELEXA) 20 MG tablet Take 1 tablet (20 mg total) by mouth daily.  . diphenhydrAMINE (BENADRYL ALLERGY) 25 MG tablet Take 25 mg by mouth every 6 (six) hours as needed for allergies.   .Marland KitchenFLOVENT HFA 110 MCG/ACT inhaler INHALE 2 PUFFS INTO THE  LUNGS 2 TIMES DAILY.  . fluticasone (FLONASE) 50 MCG/ACT nasal spray Place 2 sprays into both nostrils daily. (Patient taking differently: Place 2 sprays into both nostrils daily as needed for allergies or rhinitis. )  . gabapentin (NEURONTIN) 300 MG capsule Take 300 mg by mouth 3 (three) times daily.  .Marland Kitchenloratadine (CLARITIN) 10 MG tablet Take 10 mg by mouth daily.  . methocarbamol (ROBAXIN) 500 MG tablet Take 1 tablet (500 mg total) by mouth every 6 (six) hours as needed for muscle spasms.  . pantoprazole (PROTONIX) 40 MG tablet Take 1 tablet (40 mg total) by mouth at bedtime.  . [DISCONTINUED] amLODipine (NORVASC) 5 MG tablet Take 1 tablet (5 mg total) by mouth daily.  . [DISCONTINUED] methocarbamol (ROBAXIN) 500 MG tablet Take 1 tablet (500 mg total) by mouth every 6 (six) hours as needed for muscle spasms.  .Marland Kitchenaspirin 81 MG chewable tablet Chew 1 tablet (81 mg total) by mouth daily. (Patient not taking: Reported on 11/10/2019)  . [DISCONTINUED] acetaminophen (TYLENOL) 325 MG tablet Take 2 tablets (650 mg total) by mouth every 4 (four) hours as needed for mild pain, moderate pain, fever or headache.  . [DISCONTINUED] ondansetron (ZOFRAN ODT) 4 MG disintegrating tablet Take 1 tablet (4 mg total) by mouth every 8 (eight) hours as needed for nausea or vomiting.  . [DISCONTINUED] predniSONE (DELTASONE) 10 MG tablet Prednsione taper, take 214m(2 tabs)  PO on 11/02/19, then taper to 10mg (1 tab) on 11/03/19, then 5mg (1/2 tab) on 11/04/19, then off.   No facility-administered encounter medications on file as of 11/10/2019.     Review of Systems  Constitutional: Negative for chills and fever.  HENT: Negative for congestion, rhinorrhea and sore throat.   Respiratory: Negative for cough, shortness of breath and wheezing.   Cardiovascular: Negative for chest pain and leg swelling.  Gastrointestinal: Negative for abdominal pain, diarrhea, nausea and vomiting.  Genitourinary: Negative for dysuria and  frequency.  Skin: Negative for rash.  Neurological: Positive for light-headedness (improving with slower to get up). Negative for dizziness, weakness and headaches.     Vitals BP 130/76   Pulse (!) 104   Temp (!) 97.5 F (36.4 C)   Ht 6' 1" (1.854 m)   Wt 242 lb (109.8 kg)   SpO2 99%   BMI 31.93 kg/m   Objective:   Physical Exam Vitals and nursing note reviewed.  Constitutional:      General: He is not in acute distress.    Appearance: Normal appearance. He is not ill-appearing.  HENT:     Head: Normocephalic.     Nose: Nose normal. No congestion.     Mouth/Throat:     Mouth: Mucous membranes are moist.     Pharynx: No oropharyngeal exudate.  Eyes:     Extraocular Movements: Extraocular movements intact.     Conjunctiva/sclera: Conjunctivae normal.     Pupils: Pupils are equal, round, and reactive to light.  Cardiovascular:     Rate and Rhythm: Regular rhythm. Tachycardia present.     Pulses: Normal pulses.     Heart sounds: Normal heart sounds. No murmur heard.   Pulmonary:     Effort: Pulmonary effort is normal.     Breath sounds: Normal breath sounds. No wheezing, rhonchi or rales.  Musculoskeletal:        General: Normal range of motion.     Right lower leg: No edema.     Left lower leg: No edema.  Skin:    General: Skin is warm and dry.     Findings: No rash.  Neurological:     General: No focal deficit present.     Mental Status: He is alert and oriented to person, place, and time.     Cranial Nerves: No cranial nerve deficit.  Psychiatric:        Mood and Affect: Mood normal.        Behavior: Behavior normal.        Thought Content: Thought content normal.        Judgment: Judgment normal.      Assessment and Plan   1. Hospital discharge follow-up  2. History of septic shock  3. AKI (acute kidney injury) (HCC) - CMP14+EGFR  4. Hypophosphatemia  5. Essential hypertension, benign  6. RSD (reflex sympathetic dystrophy)     Vomting/diarrhea/septic shock- improved.  Cont to monitor labs.  AKI- last Cr at 1.4, will repeat bmp today.  hospitalist discontinued enalapril and put on norvasc for blood pressure. Pt cont to hydrate.  CHF-new, systolic- F/u cards next week for review of systolic chf, new, since being in hospital.  Chronic back and leg pain/crps/rsd- Taking percocet 5mg prn when going international travel.  Not neeidng meds at this time.  Only taking prn. occ taking valium 5mg when traveling. -cont f/u orthopedics and pain management.  Elevated Cr and liver enzymes- Recheck kidney and liver.  Pt   to call us back if needing refill on valium or percocet. Not taking it often, script lasting for years.  

## 2019-11-13 NOTE — Progress Notes (Incomplete)
Cardiology Office Note  Date: 11/13/2019   ID: Trevor Rollins, DOB September 20, 1970, MRN [id number]  PCP:  Annalee Genta, DO  Cardiologist:  Dina Rich, MD Electrophysiologist:  None   Chief Complaint: Hospital follow-up secondary to elevated troponin decreased EF on echo with global hypokinesis.  History of Present Illness: Trevor Rollins is a 49 y.o. male with a history of HTN, HLD, obesity, asthma,  Recent hospital admission 10/28/2019 with presenting complaints of 3 days of N/V/D.  He awakened on 10/26/2019 with intense shivers, feeling feverish and vomiting episodes with diarrhea.  Was seen in urgent care the next day with low blood pressure of 85/68.  In the emergency room he was tachypneic with low blood pressure of 79/55 with labs showing lactic acid of 4.3, sodium 129, creatinine 1.47, CO2 18, AST 89, ALT 61, mag 1.4, phosphorus 1.8, lipase 64, BNP 568, CBC with WBC of 15 and hemoglobin of 12.2.  Platelets of 148.  Initial troponin of 208.  Covid test negative.  Chest x-ray showed cardiomegaly without overt edema and left lower lobe atelectasis.  He was briefly on pressors and stress dose steroids along with fluid resuscitation.  Pressors were discontinued after adequate fluid resuscitation.  Echocardiogram demonstrating EF of 40 to 45% with global hypokinesis.  At bedtime troponins were  elevated at 208 -230--2268-1664.  BNP was 568, Covid was negative.  Dr. Wyline Mood was consulted.  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  Patient was to start low-dose beta-blocker and low-dose oral in the near future.  Plan was for 3 months medical therapy and repeat echo.  If ongoing dysfunction would consider ischemic evaluation at that time.  Dr. Wyline Mood recommended outpatient sleep study.  He suspected demand ischemia as explanation for elevated troponins.  Past Medical History:  Diagnosis Date  . Allergy   . Anxiety   . Asthma   . DDD (degenerative disc disease)    pt denies  . Deafness in  left ear   . ED (erectile dysfunction)   . Hyperlipidemia   . Hypertension   . Hypertriglyceridemia   . Insomnia     Past Surgical History:  Procedure Laterality Date  . ORIF ANKLE FRACTURE Right 03/01/2014   Procedure: OPEN REDUCTION INTERNAL FIXATION (ORIF) RIGHT ANKLE FRACTURE;  Surgeon: Cheral Almas, MD;  Location: [facility] OR;  Service: Orthopedics;  Laterality: Right;  . SPINAL CORD STIMULATOR BATTERY EXCHANGE N/A 12/24/2016   Procedure: Replacement of implantable pulse generator,;  Surgeon: Odette Fraction, MD;  [facility];  Service: Neurosurgery;  Laterality: N/A;  Replacement of implantable pulse generator, possible replacement of leads on spinal cord stimulator  . SPINAL CORD STIMULATOR IMPLANT    . TONSILLECTOMY    . WISDOM TOOTH EXTRACTION      Current Outpatient Medications  Medication Sig Dispense Refill  . albuterol (VENTOLIN HFA) 108 (90 Base) MCG/ACT inhaler INHALE 4 PUFFS INTO THE LUNGS EVERY 4-6 HOURS AS NEEDED FOR UP TO 30 DAYS. 18 g 0  . amLODipine (NORVASC) 5 MG tablet Take 1 tablet (5 mg total) by mouth daily. 90 tablet 1  . aspirin 81 MG chewable tablet Chew 1 tablet (81 mg total) by mouth daily. (Patient not taking: Reported on 11/10/2019)    . citalopram (CELEXA) 20 MG tablet Take 1 tablet (20 mg total) by mouth daily. 90 tablet 1  . diphenhydrAMINE (BENADRYL ALLERGY) 25 MG tablet Take 25 mg by mouth every 6 (six) hours as needed for allergies.     Marland Kitchen  FLOVENT HFA 110 MCG/ACT inhaler INHALE 2 PUFFS INTO THE LUNGS 2 TIMES DAILY. 12 g 0  . fluticasone (FLONASE) 50 MCG/ACT nasal spray Place 2 sprays into both nostrils daily. (Patient taking differently: Place 2 sprays into both nostrils daily as needed for allergies or rhinitis. ) 16 g 5  . gabapentin (NEURONTIN) 300 MG capsule Take 300 mg by mouth 3 (three) times daily.    Marland Kitchen loratadine (CLARITIN) 10 MG tablet Take 10 mg by mouth daily.    . methocarbamol (ROBAXIN) 500 MG tablet Take 1 tablet (500 mg total) by  mouth every 6 (six) hours as needed for muscle spasms. 60 tablet 2  . pantoprazole (PROTONIX) 40 MG tablet Take 1 tablet (40 mg total) by mouth at bedtime. 30 tablet 0   No current facility-administered medications for this visit.   Allergies:  Penicillins   Social History: The patient  reports that he has never smoked. He has never used smokeless tobacco. He reports current alcohol use. He reports that he does not use drugs.   Family History: The patient's family history includes Cancer in his sister; Stroke in an other family member.   ROS:  Please see the history of present illness. Otherwise, complete review of systems is positive for {NONE DEFAULTED:18576::"none"}.  All other systems are reviewed and negative.   Physical Exam: VS:  There were no vitals taken for this visit., BMI There is no height or weight on file to calculate BMI.  Wt Readings from Last 3 Encounters:  11/10/19 242 lb (109.8 kg)  11/01/19 256 lb 6.3 oz (116.3 kg)  09/19/19 239 lb (108.4 kg)    General: Patient appears comfortable at rest. HEENT: Conjunctiva and lids normal, oropharynx clear with moist mucosa. Neck: Supple, no elevated JVP or carotid bruits, no thyromegaly. Lungs: Clear to auscultation, nonlabored breathing at rest. Cardiac: Regular rate and rhythm, no S3 or significant systolic murmur, no pericardial rub. Abdomen: Soft, nontender, no hepatomegaly, bowel sounds present, no guarding or rebound. Extremities: No pitting edema, distal pulses 2+. Skin: Warm and dry. Musculoskeletal: No kyphosis. Neuropsychiatric: Alert and oriented x3, affect grossly appropriate.  ECG:  {EKG/Telemetry Strips Reviewed:[phone]}  Recent Labwork: 10/28/2019: B Natriuretic Peptide 568.1 10/30/2019: ALT 44; AST 34 11/01/2019: BUN 26; Creatinine, Ser 1.53; Hemoglobin 11.8; Magnesium 1.8; Platelets 209; Potassium 3.9; Sodium 142     Component Value Date/Time   CHOL 199 08/19/2012 1401   TRIG 211 (H) 08/19/2012 1401    HDL 48 08/19/2012 1401   CHOLHDL 4.1 08/19/2012 1401   VLDL 42 (H) 08/19/2012 1401   LDLCALC 109 (H) 08/19/2012 1401    Other Studies Reviewed Today:  Echocardiogram 10/28/2019  1. Left ventricular ejection fraction, by estimation, is 40 to 45%. The left ventricle has mildly decreased function. The left ventricle demonstrates global hypokinesis. There is moderate left ventricular hypertrophy. Left ventricular diastolic parameters are consistent with Grade I diastolic dysfunction (impaired relaxation). 2. Right ventricular systolic function is moderately reduced. The right ventricular size is mildly enlarged. There is normal pulmonary artery systolic pressure. 3. The mitral valve is normal in structure. Trivial mitral valve regurgitation. No evidence of mitral stenosis. 4. The aortic valve is normal in structure. Aortic valve regurgitation is not visualized. No aortic stenosis is present. 5. The inferior vena cava is normal in size with greater than 50% respiratory variability, suggesting right atrial pressure of 3 mmHg.  Assessment and Plan:  No diagnosis found.   Medication Adjustments/Labs and Tests Ordered: Current medicines are reviewed  at length with the patient today.  Concerns regarding medicines are outlined above.   Disposition: Follow-up with ***  Signed, Rennis Harding, NP 11/13/2019 10:27 PM    [facility] at [facility] [address] [place], [place], [place] [address] Phone: [phone]; Fax: [phone]

## 2019-11-14 ENCOUNTER — Encounter: Payer: Self-pay | Admitting: Family Medicine

## 2019-11-14 ENCOUNTER — Ambulatory Visit (INDEPENDENT_AMBULATORY_CARE_PROVIDER_SITE_OTHER): Payer: BC Managed Care – PPO | Admitting: Family Medicine

## 2019-11-14 VITALS — BP 138/80 | HR 90 | Ht 73.5 in | Wt 239.4 lb

## 2019-11-14 DIAGNOSIS — R29818 Other symptoms and signs involving the nervous system: Secondary | ICD-10-CM

## 2019-11-14 DIAGNOSIS — I429 Cardiomyopathy, unspecified: Secondary | ICD-10-CM

## 2019-11-14 DIAGNOSIS — I10 Essential (primary) hypertension: Secondary | ICD-10-CM

## 2019-11-14 MED ORDER — LOSARTAN POTASSIUM 25 MG PO TABS: 12.5000 mg | ORAL_TABLET | Freq: Every day | ORAL | 6 refills | Status: DC

## 2019-11-14 MED ORDER — CARVEDILOL 3.125 MG PO TABS: 3.1250 mg | ORAL_TABLET | Freq: Two times a day (BID) | ORAL | 6 refills | Status: DC

## 2019-11-14 NOTE — Progress Notes (Signed)
Cardiology Office Note  Date: 11/14/2019   ID: Trevor Rollins, DOB 06-12-1970, MRN [id number]  PCP:  Annalee Genta, DO  Cardiologist:  Dina Rich, MD Electrophysiologist:  None   Chief Complaint: Hospital follow-up secondary to elevated troponin decreased EF on echo with global hypokinesis.  History of Present Illness: Trevor Rollins is a 49 y.o. male with a history of HTN, HLD, obesity, asthma,  Recent hospital admission 10/28/2019 with presenting complaints of 3 days of N/V/D.  He awakened on 10/26/2019 with intense shivers, feeling feverish and vomiting episodes with diarrhea.  Was seen in urgent care the next day with low blood pressure of 85/68.  In the emergency room he was tachypneic with low blood pressure of 79/55 with labs showing lactic acid of 4.3, sodium 129, creatinine 1.47, CO2 18, AST 89, ALT 61, mag 1.4, phosphorus 1.8, lipase 64, BNP 568, CBC with WBC of 15 and hemoglobin of 12.2.  Platelets of 148.  Initial troponin of 208.  Covid test negative.  Chest x-ray showed cardiomegaly without overt edema and left lower lobe atelectasis.  He was briefly on pressors and stress dose steroids along with fluid resuscitation.  Pressors were discontinued after adequate fluid resuscitation.  Echocardiogram demonstrating EF of 40 to 45% with global hypokinesis.  At bedtime troponins were  elevated at 208 -230--2268-1664.  BNP was 568, Covid was negative.  Dr. Wyline Mood was consulted.  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  Patient was to start low-dose beta-blocker and low-dose ARB in the near future.  Plan was for 3 months medical therapy and repeat echo.  If ongoing dysfunction would consider ischemic evaluation at that time.  Dr. Wyline Mood recommended outpatient sleep study.  He suspected demand ischemia as explanation for elevated troponins.   Patient presents today status post hospital discharge states he feels like a million bucks.  Does have some minor exercise intolerance.  Denies  any anginal or exertional symptoms, palpitations or arrhythmias, orthostatic symptoms, CVA or TIA-like symptoms, PND or orthopnea, lower extremity edema, claudication-like symptoms.  He does have some sciatica and recently had a steroidal injection for  this issue.  Denies any lower extremity edema, DVT or PE-like symptoms.  Echo showed reduced systolic function during recent hospital stay for septic shock.  Past Medical History:  Diagnosis Date  . Allergy   . Anxiety   . Asthma   . DDD (degenerative disc disease)    pt denies  . Deafness in left ear   . ED (erectile dysfunction)   . Hyperlipidemia   . Hypertension   . Hypertriglyceridemia   . Insomnia     Past Surgical History:  Procedure Laterality Date  . ORIF ANKLE FRACTURE Right 03/01/2014   Procedure: OPEN REDUCTION INTERNAL FIXATION (ORIF) RIGHT ANKLE FRACTURE;  Surgeon: Cheral Almas, MD;  Location: [facility] OR;  Service: Orthopedics;  Laterality: Right;  . SPINAL CORD STIMULATOR BATTERY EXCHANGE N/A 12/24/2016   Procedure: Replacement of implantable pulse generator,;  Surgeon: Odette Fraction, MD;  Location[facility] OR;  Service: Neurosurgery;  Laterality: N/A;  Replacement of implantable pulse generator, possible replacement of leads on spinal cord stimulator  . SPINAL CORD STIMULATOR IMPLANT    . TONSILLECTOMY    . WISDOM TOOTH EXTRACTION      Current Outpatient Medications  Medication Sig Dispense Refill  . albuterol (VENTOLIN HFA) 108 (90 Base) MCG/ACT inhaler INHALE 4 PUFFS INTO THE LUNGS EVERY 4-6 HOURS AS NEEDED FOR UP TO 30 DAYS. 18 g 0  .  amLODipine (NORVASC) 5 MG tablet Take 1 tablet (5 mg total) by mouth daily. 90 tablet 1  . citalopram (CELEXA) 20 MG tablet Take 1 tablet (20 mg total) by mouth daily. 90 tablet 1  . diphenhydrAMINE (BENADRYL ALLERGY) 25 MG tablet Take 25 mg by mouth every 6 (six) hours as needed for allergies.     Marland Kitchen FLOVENT HFA 110 MCG/ACT inhaler INHALE 2 PUFFS INTO THE LUNGS 2 TIMES DAILY. 12 g 0  .  fluticasone (FLONASE) 50 MCG/ACT nasal spray Place 2 sprays into both nostrils daily. (Patient taking differently: Place 2 sprays into both nostrils daily as needed for allergies or rhinitis. ) 16 g 5  . gabapentin (NEURONTIN) 300 MG capsule Take 300 mg by mouth 3 (three) times daily.    Marland Kitchen loratadine (CLARITIN) 10 MG tablet Take 10 mg by mouth daily.    . methocarbamol (ROBAXIN) 500 MG tablet Take 1 tablet (500 mg total) by mouth every 6 (six) hours as needed for muscle spasms. 60 tablet 2  . pantoprazole (PROTONIX) 40 MG tablet Take 1 tablet (40 mg total) by mouth at bedtime. 30 tablet 0  . aspirin 81 MG chewable tablet Chew 1 tablet (81 mg total) by mouth daily. (Patient not taking: Reported on 11/10/2019)    . carvedilol (COREG) 3.125 MG tablet Take 1 tablet (3.125 mg total) by mouth 2 (two) times daily. 60 tablet 6  . losartan (COZAAR) 25 MG tablet Take 0.5 tablets (12.5 mg total) by mouth daily. 15 tablet 6   No current facility-administered medications for this visit.   Allergies:  Penicillins   Social History: The patient  reports that he has never smoked. He has never used smokeless tobacco. He reports current alcohol use. He reports that he does not use drugs.   Family History: The patient's family history includes Cancer in his sister; Stroke in an other family member.   ROS:  Please see the history of present illness. Otherwise, complete review of systems is positive for none.  All other systems are reviewed and negative.   Physical Exam: VS:  BP 138/80   Pulse 90   Ht 6' 1.5" (1.867 m)   Wt 239 lb 6.4 oz (108.6 kg)   SpO2 97%   BMI 31.16 kg/m , BMI Body mass index is 31.16 kg/m.  Wt Readings from Last 3 Encounters:  11/14/19 239 lb 6.4 oz (108.6 kg)  11/10/19 242 lb (109.8 kg)  11/01/19 256 lb 6.3 oz (116.3 kg)    General: Patient appears comfortable at rest. Neck: Supple, no elevated JVP or carotid bruits, no thyromegaly. Lungs: Clear to auscultation, nonlabored  breathing at rest. Cardiac: Regular rate and rhythm, no S3 or significant systolic murmur, no pericardial rub. Extremities: No pitting edema, distal pulses 2+. Skin: Warm and dry. Musculoskeletal: No kyphosis. Neuropsychiatric: Alert and oriented x3, affect grossly appropriate.  ECG:  EKG on October 29, 2019 showed normal sinus rhythm rate of 77.  Recent Labwork: 10/28/2019: B Natriuretic Peptide 568.1 10/30/2019: ALT 44; AST 34 11/01/2019: BUN 26; Creatinine, Ser 1.53; Hemoglobin 11.8; Magnesium 1.8; Platelets 209; Potassium 3.9; Sodium 142     Component Value Date/Time   CHOL 199 08/19/2012 1401   TRIG 211 (H) 08/19/2012 1401   HDL 48 08/19/2012 1401   CHOLHDL 4.1 08/19/2012 1401   VLDL 42 (H) 08/19/2012 1401   LDLCALC 109 (H) 08/19/2012 1401    Other Studies Reviewed Today:  Echocardiogram 10/28/2019  1. Left ventricular ejection fraction, by estimation, is  40 to 45%. The left ventricle has mildly decreased function. The left ventricle demonstrates global hypokinesis. There is moderate left ventricular hypertrophy. Left ventricular diastolic parameters are consistent with Grade I diastolic dysfunction (impaired relaxation). 2. Right ventricular systolic function is moderately reduced. The right ventricular size is mildly enlarged. There is normal pulmonary artery systolic pressure. 3. The mitral valve is normal in structure. Trivial mitral valve regurgitation. No evidence of mitral stenosis. 4. The aortic valve is normal in structure. Aortic valve regurgitation is not visualized. No aortic stenosis is present. 5. The inferior vena cava is normal in size with greater than 50% respiratory variability, suggesting right atrial pressure of 3 mmHg.  Assessment and Plan:  1. Cardiomyopathy, unspecified type (HCC)   2. Essential hypertension, benign   3. Suspected sleep apnea     1. Cardiomyopathy, unspecified type [facility]) Recent echocardiogram during hospitalization for septic  shock.  EF of 40 to 45%, global hypokinesis, LVH, G1 DD. trivial MR. Had associated elevated troponins suspected due to demand ischemia.  Repeat an echocardiogram in 3 months from the day of last echo on 10/28/2019 to reassess LV function, diastolic function and valvular function.  If no improvement of EF will proceed with ischemic work-up.  Start carvedilol 3.125 mg p.o. twice daily.  Start losartan 25 mg p.o. daily.  2. Essential hypertension, benign Blood pressure today 138/80.  Continue amlodipine 5 mg daily.  3. Suspected sleep apnea Please refer to pulmonology for sleep study to evaluate for OSA.  History of snoring, periods of apnea.    Medication Adjustments/Labs and Tests Ordered: Current medicines are reviewed at length with the patient today.  Concerns regarding medicines are outlined above.   Disposition: Follow-up with Dr. Wyline Mood or APP 1 month  Signed, Rennis Harding, NP 11/14/2019 1:07 PM    [facility] at [facility] [address] [place], [place], [place] [address] Phone: [phone]; Fax: [phone]

## 2019-11-14 NOTE — Patient Instructions (Signed)
Medication Instructions:   Begin Coreg 3.125mg  twice a day.  Begin Losartan 12.5mg  daily.   Continue all other medications.    Labwork: none  Testing/Procedures:  Your physician has requested that you have an echocardiogram. Echocardiography is a painless test that uses sound waves to create images of your heart. It provides your doctor with information about the size and shape of your heart and how well your heart's chambers and valves are working. This procedure takes approximately one hour. There are no restrictions for this procedure - DUE MID December 2021.  Office will contact with results via phone or letter.    Follow-Up: 4-6 weeks   Any Other Special Instructions Will Be Listed Below (If Applicable). You have been referred to:  [facility]   If you need a refill on your cardiac medications before your next appointment, please call your pharmacy.

## 2019-12-07 ENCOUNTER — Telehealth: Payer: Self-pay | Admitting: *Deleted

## 2019-12-07 ENCOUNTER — Encounter: Payer: Self-pay | Admitting: *Deleted

## 2019-12-07 NOTE — Telephone Encounter (Signed)
This encounter was created in error - please disregard.

## 2019-12-07 NOTE — Telephone Encounter (Signed)
[employer] has dental appt for routine cleaning and Dr Tenny Craw is requesting guidance if pt requires premed prior to dental tx and is pt ok to have have dental tx

## 2019-12-11 NOTE — Telephone Encounter (Signed)
Routed to Dr. Ross 

## 2019-12-11 NOTE — Telephone Encounter (Signed)
Dental cleaning/treatments are fine, no need for any premedication   Dina Rich MD

## 2019-12-20 ENCOUNTER — Institutional Professional Consult (permissible substitution): Payer: BC Managed Care – PPO | Admitting: Neurology

## 2019-12-21 ENCOUNTER — Ambulatory Visit: Payer: BC Managed Care – PPO | Admitting: Family Medicine

## 2019-12-29 ENCOUNTER — Other Ambulatory Visit: Payer: Self-pay

## 2019-12-29 ENCOUNTER — Ambulatory Visit (INDEPENDENT_AMBULATORY_CARE_PROVIDER_SITE_OTHER): Payer: BC Managed Care – PPO | Admitting: Family Medicine

## 2019-12-29 ENCOUNTER — Encounter: Payer: Self-pay | Admitting: Family Medicine

## 2019-12-29 VITALS — HR 91 | Temp 97.0°F | Resp 16

## 2019-12-29 DIAGNOSIS — H66002 Acute suppurative otitis media without spontaneous rupture of ear drum, left ear: Secondary | ICD-10-CM | POA: Diagnosis not present

## 2019-12-29 DIAGNOSIS — J45991 Cough variant asthma: Secondary | ICD-10-CM

## 2019-12-29 MED ORDER — PREDNISONE 20 MG PO TABS: ORAL_TABLET | ORAL | 0 refills | Status: DC

## 2019-12-29 MED ORDER — FLOVENT HFA 110 MCG/ACT IN AERO: 2.0000 | INHALATION_SPRAY | Freq: Two times a day (BID) | RESPIRATORY_TRACT | 0 refills | Status: DC

## 2019-12-29 MED ORDER — DOXYCYCLINE HYCLATE 100 MG PO TABS: 100.0000 mg | ORAL_TABLET | Freq: Two times a day (BID) | ORAL | 0 refills | Status: DC

## 2019-12-29 NOTE — Progress Notes (Signed)
Patient ID: Trevor Rollins, male    DOB: [birth year], 49 y.o.   MRN: [id number]   Chief Complaint  Patient presents with  . Cough   Subjective:    Presents today for an acute visit for coughing and wheezing.  This is a new problem, however he does have a history of asthma.  Pertinent negatives include no fever, no chills, no fatigue, no chest pain, no shortness of breath.  Associated symptoms include headache after coughing congestion.  Symptoms started on Saturday.  He has tried Mucinex and his albuterol inhaler, however, he is out of his Flovent inhaler his asthma is usually managed by a specialist, but he has been so busy with work he has not been to the specialist in a while.  He has received his booster of Materna Covid vaccine.  No known sick exposures.  Left ear does feel fullness, however he does have deafness in the ear.  He had a video on his phone of him wheezing last evening.  Cough This is a new problem. Episode onset: Saturday. The problem has been gradually worsening. The cough is productive of sputum. Associated symptoms include nasal congestion and wheezing. Pertinent negatives include no chest pain, chills, fever, myalgias or shortness of breath. Treatments tried: mucinex.     Medical History Trevor Rollins has a past medical history of Allergy, Anxiety, Asthma, DDD (degenerative disc disease), Deafness in left ear, ED (erectile dysfunction), Hyperlipidemia, Hypertension, Hypertriglyceridemia, and Insomnia.   Outpatient Encounter Medications as of 12/29/2019  Medication Sig  . albuterol (VENTOLIN HFA) 108 (90 Base) MCG/ACT inhaler INHALE 4 PUFFS INTO THE LUNGS EVERY 4-6 HOURS AS NEEDED FOR UP TO 30 DAYS.  Marland Kitchen amLODipine (NORVASC) 5 MG tablet Take 1 tablet (5 mg total) by mouth daily.  Marland Kitchen aspirin 81 MG chewable tablet Chew 1 tablet (81 mg total) by mouth daily. (Patient not taking: Reported on 11/10/2019)  . carvedilol (COREG) 3.125 MG tablet Take 1 tablet (3.125 mg total) by mouth 2 (two)  times daily.  . citalopram (CELEXA) 20 MG tablet Take 1 tablet (20 mg total) by mouth daily.  . diphenhydrAMINE (BENADRYL ALLERGY) 25 MG tablet Take 25 mg by mouth every 6 (six) hours as needed for allergies.   Marland Kitchen doxycycline (VIBRA-TABS) 100 MG tablet Take 1 tablet (100 mg total) by mouth 2 (two) times daily.  . fluticasone (FLONASE) 50 MCG/ACT nasal spray Place 2 sprays into both nostrils daily. (Patient taking differently: Place 2 sprays into both nostrils daily as needed for allergies or rhinitis. )  . fluticasone (FLOVENT HFA) 110 MCG/ACT inhaler Inhale 2 puffs into the lungs in the morning and at bedtime.  . gabapentin (NEURONTIN) 300 MG capsule Take 300 mg by mouth 3 (three) times daily.  Marland Kitchen loratadine (CLARITIN) 10 MG tablet Take 10 mg by mouth daily.  Marland Kitchen losartan (COZAAR) 25 MG tablet Take 0.5 tablets (12.5 mg total) by mouth daily.  . methocarbamol (ROBAXIN) 500 MG tablet Take 1 tablet (500 mg total) by mouth every 6 (six) hours as needed for muscle spasms.  . pantoprazole (PROTONIX) 40 MG tablet Take 1 tablet (40 mg total) by mouth at bedtime.  . predniSONE (DELTASONE) 20 MG tablet Take 3 tablets for 3 days, then 2 tablets for 3 days, then 1 tablet for 3 days by mouth  . [DISCONTINUED] FLOVENT HFA 110 MCG/ACT inhaler INHALE 2 PUFFS INTO THE LUNGS 2 TIMES DAILY.   No facility-administered encounter medications on file as of 12/29/2019.  Review of Systems  Constitutional: Negative for chills and fever.  HENT: Positive for congestion.   Respiratory: Positive for cough and wheezing. Negative for shortness of breath.   Cardiovascular: Negative for chest pain.  Gastrointestinal: Negative for abdominal pain.  Musculoskeletal: Negative for myalgias.     Vitals Pulse 91   Temp (!) 97 F (36.1 C)   Resp 16   SpO2 99%   Objective:   Physical Exam Vitals and nursing note reviewed.  Constitutional:      General: He is not in acute distress.    Appearance: He is not  toxic-appearing.  HENT:     Right Ear: Tympanic membrane normal.     Left Ear: Decreased hearing noted. Tenderness present. No mastoid tenderness. Tympanic membrane is erythematous and bulging.     Nose: Nose normal.     Mouth/Throat:     Mouth: Mucous membranes are moist.     Pharynx: No posterior oropharyngeal erythema.  Cardiovascular:     Rate and Rhythm: Normal rate and regular rhythm.     Heart sounds: Normal heart sounds.  Pulmonary:     Effort: Pulmonary effort is normal.     Breath sounds: Normal breath sounds. No wheezing.  Skin:    General: Skin is warm and dry.  Neurological:     Mental Status: He is alert and oriented to person, place, and time.  Psychiatric:        Mood and Affect: Mood normal.        Behavior: Behavior normal.        Thought Content: Thought content normal.        Judgment: Judgment normal.      Assessment and Plan   1. Non-recurrent acute suppurative otitis media of left ear without spontaneous rupture of tympanic membrane - doxycycline (VIBRA-TABS) 100 MG tablet; Take 1 tablet (100 mg total) by mouth 2 (two) times daily.  Dispense: 20 tablet; Refill: 0  2. Asthma, cough variant - predniSONE (DELTASONE) 20 MG tablet; Take 3 tablets for 3 days, then 2 tablets for 3 days, then 1 tablet for 3 days by mouth  Dispense: 18 tablet; Refill: 0 - fluticasone (FLOVENT HFA) 110 MCG/ACT inhaler; Inhale 2 puffs into the lungs in the morning and at bedtime.  Dispense: 12 g; Refill: 0   Lungs clear, no wheezing today.  The video on his phone from last evening, he was struggling and wheezing.  Will refill the Flovent that is usually managed by the specialist.  He does not have time due to his busy work schedule to go to the specialist.  Will treat left ear otitis media with antibiotic x10 days.  For the cough due to asthma, will treat with a prednisone taper.  Will  refill Flovent.  Agrees with plan of care discussed today. Understands warning signs to seek  further care: Chest pain, shortness of breath, any significant changes in health status. Understands to follow-up if symptoms do not improve, or worsen.  [name], FNP-C    [name], NP 12/29/2019

## 2020-01-15 ENCOUNTER — Telehealth: Payer: Self-pay

## 2020-01-15 ENCOUNTER — Institutional Professional Consult (permissible substitution): Payer: BC Managed Care – PPO | Admitting: Neurology

## 2020-01-15 ENCOUNTER — Encounter: Payer: Self-pay | Admitting: Neurology

## 2020-01-15 NOTE — Telephone Encounter (Signed)
Pt did not show for their appt with Dr. Athar today.  

## 2020-01-23 ENCOUNTER — Ambulatory Visit: Payer: BC Managed Care – PPO | Admitting: Family Medicine

## 2020-01-23 ENCOUNTER — Telehealth: Payer: Self-pay | Admitting: Family Medicine

## 2020-01-23 ENCOUNTER — Ambulatory Visit (INDEPENDENT_AMBULATORY_CARE_PROVIDER_SITE_OTHER): Payer: BC Managed Care – PPO | Admitting: Family Medicine

## 2020-01-23 ENCOUNTER — Encounter: Payer: Self-pay | Admitting: Family Medicine

## 2020-01-23 VITALS — BP 142/90 | HR 114 | Temp 96.8°F | Ht 73.05 in | Wt 243.2 lb

## 2020-01-23 DIAGNOSIS — M5416 Radiculopathy, lumbar region: Secondary | ICD-10-CM

## 2020-01-23 DIAGNOSIS — M62838 Other muscle spasm: Secondary | ICD-10-CM | POA: Diagnosis not present

## 2020-01-23 DIAGNOSIS — I429 Cardiomyopathy, unspecified: Secondary | ICD-10-CM | POA: Diagnosis not present

## 2020-01-23 MED ORDER — LOSARTAN POTASSIUM 25 MG PO TABS: 25.0000 mg | ORAL_TABLET | Freq: Every day | ORAL | 1 refills | Status: DC

## 2020-01-23 MED ORDER — DIAZEPAM 5 MG PO TABS: 5.0000 mg | ORAL_TABLET | Freq: Two times a day (BID) | ORAL | 1 refills | Status: DC | PRN

## 2020-01-23 NOTE — Progress Notes (Signed)
Patient ID: Trevor Rollins, male    DOB: [birth year], 49 y.o.   MRN: [id number]   Chief Complaint  Patient presents with  . Hypertension  . Follow-up    ER visit at [facility] for rt chest pain   Subjective:    HPI Pt here for hypertension check. Pt states he went to [facility] ED on 01/16/20 for chest pain and blood pressure was elevated. Pt states that he is not having any headache, dizziness, blurred vision since Sept 18. Pt would like refill on Diazepam- Dr.Steve gives this to him when he travels for muscle spasms.   Pt seeing cards next week and h/o chf from recent visit to hospital in 9/21 for septic shock from dehydration from v/d.  Pt  Having rt sided chest pain and checked bp at home and noticed bp was 193/122, at home then went to ER due to rt chest pain and elevated bp.  Wife concerned and had pt go to ER. Had pain for 20 mins, at rest, before going to ER. Came down on it's own, while in ER.  Labs done and u/s of ruq done and was normal.  They did find slight elevated lipase at 216.  Heart enzymes and ekgs were normal.  Pt had lipase- slight elevation and since then no further pain in rt chest.  No pain with eating. Feeling better now.  Wasn't sure if was a "muscle spasm."  Pt is going to go out of town and pt needing something for muscle spasms.  Pt had low back pain and has some rt back pain. L5-S1 getting worse, since June '21. Going to meet with Dr. Lovell Sheehan for possible surgery for lumbar fusion. Not able to work as much due to lumbar back pain worsening.  Had URI and bronchitis, and given doxy and prendinose and resolved.  12/29/19.  Medical History [place] has a past medical history of Allergy, Anxiety, Asthma, DDD (degenerative disc disease), Deafness in left ear, ED (erectile dysfunction), Hyperlipidemia, Hypertension, Hypertriglyceridemia, and Insomnia.   Outpatient Encounter Medications as of 01/23/2020  Medication Sig  . albuterol (VENTOLIN HFA) 108 (90 Base) MCG/ACT  inhaler INHALE 4 PUFFS INTO THE LUNGS EVERY 4-6 HOURS AS NEEDED FOR UP TO 30 DAYS.  Marland Kitchen amLODipine (NORVASC) 5 MG tablet Take 1 tablet (5 mg total) by mouth daily.  . carvedilol (COREG) 3.125 MG tablet Take 1 tablet (3.125 mg total) by mouth 2 (two) times daily.  . citalopram (CELEXA) 20 MG tablet Take 1 tablet (20 mg total) by mouth daily.  . diphenhydrAMINE (BENADRYL) 25 MG tablet Take 25 mg by mouth every 6 (six) hours as needed for allergies.   . fluticasone (FLONASE) 50 MCG/ACT nasal spray Place 2 sprays into both nostrils daily. (Patient taking differently: Place 2 sprays into both nostrils daily as needed for allergies or rhinitis.)  . fluticasone (FLOVENT HFA) 110 MCG/ACT inhaler Inhale 2 puffs into the lungs in the morning and at bedtime.  . gabapentin (NEURONTIN) 300 MG capsule Take 300 mg by mouth 3 (three) times daily.  Marland Kitchen loratadine (CLARITIN) 10 MG tablet Take 10 mg by mouth daily.  . methocarbamol (ROBAXIN) 500 MG tablet Take 1 tablet (500 mg total) by mouth every 6 (six) hours as needed for muscle spasms.  . [DISCONTINUED] losartan (COZAAR) 25 MG tablet Take 0.5 tablets (12.5 mg total) by mouth daily.  . diazepam (VALIUM) 5 MG tablet Take 1 tablet (5 mg total) by mouth every 12 (twelve) hours as  needed for anxiety or muscle spasms.  Marland Kitchen losartan (COZAAR) 25 MG tablet Take 1 tablet (25 mg total) by mouth daily.  . [DISCONTINUED] aspirin 81 MG chewable tablet Chew 1 tablet (81 mg total) by mouth daily. (Patient not taking: Reported on 11/10/2019)  . [DISCONTINUED] doxycycline (VIBRA-TABS) 100 MG tablet Take 1 tablet (100 mg total) by mouth 2 (two) times daily.  . [DISCONTINUED] pantoprazole (PROTONIX) 40 MG tablet Take 1 tablet (40 mg total) by mouth at bedtime.  . [DISCONTINUED] predniSONE (DELTASONE) 20 MG tablet Take 3 tablets for 3 days, then 2 tablets for 3 days, then 1 tablet for 3 days by mouth   No facility-administered encounter medications on file as of 01/23/2020.     Review  of Systems  Constitutional: Negative for chills and fever.  HENT: Negative for congestion, rhinorrhea and sore throat.   Respiratory: Negative for cough, shortness of breath and wheezing.   Cardiovascular: Positive for chest pain (rt chest pain, resolved). Negative for leg swelling.  Gastrointestinal: Negative for abdominal pain, diarrhea, nausea and vomiting.  Genitourinary: Negative for dysuria and frequency.  Musculoskeletal: Positive for back pain (chronic).  Skin: Negative for rash.  Neurological: Negative for dizziness, weakness and headaches.     Vitals BP (!) 142/90   Pulse (!) 114   Temp (!) 96.8 F (36 C)   Ht 6' 1.05" (1.855 m)   Wt 243 lb 3.2 oz (110.3 kg)   SpO2 98%   BMI 32.04 kg/m   Objective:   Physical Exam Vitals and nursing note reviewed.  Constitutional:      General: He is in acute distress (mild due to lumbar pain).     Appearance: Normal appearance. He is not ill-appearing.  HENT:     Head: Normocephalic.     Nose: Nose normal. No congestion.     Mouth/Throat:     Mouth: Mucous membranes are moist.     Pharynx: No oropharyngeal exudate.  Eyes:     Extraocular Movements: Extraocular movements intact.     Conjunctiva/sclera: Conjunctivae normal.     Pupils: Pupils are equal, round, and reactive to light.  Cardiovascular:     Rate and Rhythm: Regular rhythm. Tachycardia present.     Pulses: Normal pulses.     Heart sounds: Normal heart sounds. No murmur heard.   Pulmonary:     Effort: Pulmonary effort is normal.     Breath sounds: Normal breath sounds. No wheezing, rhonchi or rales.  Musculoskeletal:        General: Normal range of motion.     Right lower leg: No edema.     Left lower leg: No edema.  Skin:    General: Skin is warm and dry.     Findings: No rash.  Neurological:     General: No focal deficit present.     Mental Status: He is alert and oriented to person, place, and time.     Cranial Nerves: No cranial nerve deficit.   Psychiatric:        Behavior: Behavior normal.        Thought Content: Thought content normal.        Judgment: Judgment normal.     Comments: +anxious affect      Assessment and Plan   1. Lumbar radiculopathy, chronic  2. Cardiomyopathy, unspecified type (HCC) - losartan (COZAAR) 25 MG tablet; Take 1 tablet (25 mg total) by mouth daily.  Dispense: 30 tablet; Refill: 1  3. Muscle spasm of right  lower extremity   Lumbar radiculopathy- Pt f/u with Dr. Lovell Sheehan for consult on lumbar radiculopathy. Has had 2 sets of injections not much relief except 3-4 days.  Labs from 01/16/20- cr normal.  Reviewed from [facility] notes.  Lumbar spasm-Refilled valium for muscle spasms, 30 days with 1 refill, only using prn. Usually when going out of town, needing it for spasms.  htn and cardiomyopathy--uncontrolled. Adjusted bp meds and increase losartan to 25mg  instead of 12.5mg .  Pt to see cards in 3 days for f/u on chf and septic shock. Cont with amlodipine 5mg , coreg 3.125mg .  F/u 43mo

## 2020-01-23 NOTE — Patient Instructions (Signed)
Go up to 25mg  losartan daily for blood pressure.

## 2020-01-23 NOTE — Telephone Encounter (Signed)
  Patient Consent for Virtual Visit         DWANE ANDRES has provided verbal consent on 01/23/2020 for a virtual visit (video or telephone).   CONSENT FOR VIRTUAL VISIT FOR:  Baruch Merl Bacus  By participating in this virtual visit I agree to the following:  I hereby voluntarily request, consent and authorize [facility] and its employed or contracted physicians, physician assistants, nurse practitioners or other licensed health care professionals (the Practitioner), to provide me with telemedicine health care services (the "Services") as deemed necessary by the treating Practitioner. I acknowledge and consent to receive the Services by the Practitioner via telemedicine. I understand that the telemedicine visit will involve communicating with the Practitioner through live audiovisual communication technology and the disclosure of certain medical information by electronic transmission. I acknowledge that I have been given the opportunity to request an in-person assessment or other available alternative prior to the telemedicine visit and am voluntarily participating in the telemedicine visit.  I understand that I have the right to withhold or withdraw my consent to the use of telemedicine in the course of my care at any time, without affecting my right to future care or treatment, and that the Practitioner or I may terminate the telemedicine visit at any time. I understand that I have the right to inspect all information obtained and/or recorded in the course of the telemedicine visit and may receive copies of available information for a reasonable fee.  I understand that some of the potential risks of receiving the Services via telemedicine include:  Marland Kitchen Delay or interruption in medical evaluation due to technological equipment failure or disruption; . Information transmitted may not be sufficient (e.g. poor resolution of images) to allow for appropriate medical decision making by the Practitioner;  and/or  . In rare instances, security protocols could fail, causing a breach of personal health information.  Furthermore, I acknowledge that it is my responsibility to provide information about my medical history, conditions and care that is complete and accurate to the best of my ability. I acknowledge that Practitioner's advice, recommendations, and/or decision may be based on factors not within their control, such as incomplete or inaccurate data provided by me or distortions of diagnostic images or specimens that may result from electronic transmissions. I understand that the practice of medicine is not an exact science and that Practitioner makes no warranties or guarantees regarding treatment outcomes. I acknowledge that a copy of this consent can be made available to me via my patient portal [facility] MyChart), or I can request a printed copy by calling the office of [facility].    I understand that my insurance will be billed for this visit.   I have read or had this consent read to me. . I understand the contents of this consent, which adequately explains the benefits and risks of the Services being provided via telemedicine.  . I have been provided ample opportunity to ask questions regarding this consent and the Services and have had my questions answered to my satisfaction. . I give my informed consent for the services to be provided through the use of telemedicine in my medical care

## 2020-01-24 ENCOUNTER — Telehealth: Payer: Self-pay | Admitting: Family Medicine

## 2020-01-24 NOTE — Telephone Encounter (Signed)
Patient states he received Moderna COVID vaccine no further information was given.

## 2020-01-25 ENCOUNTER — Telehealth: Payer: Self-pay | Admitting: *Deleted

## 2020-01-25 ENCOUNTER — Ambulatory Visit (INDEPENDENT_AMBULATORY_CARE_PROVIDER_SITE_OTHER): Payer: BC Managed Care – PPO

## 2020-01-25 DIAGNOSIS — I429 Cardiomyopathy, unspecified: Secondary | ICD-10-CM

## 2020-01-25 LAB — ECHOCARDIOGRAM COMPLETE
Area-P 1/2: 3.27 cm2
Calc EF: 64.9 %
MV M vel: 4.67 m/s
MV Peak grad: 87.4 mmHg
S' Lateral: 3.32 cm
Single Plane A2C EF: 70.7 %
Single Plane A4C EF: 55.9 %

## 2020-01-25 NOTE — Progress Notes (Signed)
This is a telemedicine visit Patient location: Home Provider location: Office      Cardiology Office Note  Date: 11/14/2019   ID: RICHARDO POPOFF, DOB 1970-12-16, MRN [id number]  PCP:  Annalee Genta, DO  Cardiologist:  Dina Rich, MD Electrophysiologist:  None   Chief Complaint: Hospital follow-up secondary to elevated troponin decreased EF on echo with global hypokinesis.  History of Present Illness: Trevor Rollins is a 49 y.o. male with a history of HTN, HLD, obesity, asthma,  Recent hospital admission 10/28/2019 with presenting complaints of 3 days of N/V/D.  He awakened on 10/26/2019 with intense shivers, feeling feverish and vomiting episodes with diarrhea.  Was seen in urgent care the next day with low blood pressure of 85/68.  In the emergency room he was tachypneic with low blood pressure of 79/55 with labs showing lactic acid of 4.3, sodium 129, creatinine 1.47, CO2 18, AST 89, ALT 61, mag 1.4, phosphorus 1.8, lipase 64, BNP 568, CBC with WBC of 15 and hemoglobin of 12.2.  Platelets of 148.  Initial troponin of 208.  Covid test negative.  Chest x-ray showed cardiomegaly without overt edema and left lower lobe atelectasis.  He was briefly on pressors and stress dose steroids along with fluid resuscitation.  Pressors were discontinued after adequate fluid resuscitation.  Echocardiogram demonstrating EF of 40 to 45% with global hypokinesis.  At bedtime troponins were  elevated at 208 -230--[id number].  BNP was 568, Covid was negative.  Dr. Wyline Mood was consulted.  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  Patient was to start low-dose beta-blocker and low-dose ARB in the near future.  Plan was for 3 months medical therapy and repeat echo.  If ongoing dysfunction would consider ischemic evaluation at that time.  Dr. Wyline Mood recommended outpatient sleep study.  He suspected demand ischemia as explanation for elevated troponins.   Presented last visit status post hospital discharge  stated he felt like a million bucks. Did have some minor exercise intolerance.  Denied any anginal or exertional symptoms, palpitations or arrhythmias, orthostatic symptoms, CVA or TIA-like symptoms, PND or orthopnea, lower extremity edema, claudication-like symptoms. He was having some sciatica and recently had a steroidal injection for this issue.  Denied any lower extremity edema, DVT or PE-like symptoms.  Echo showed reduced systolic function during recent hospital stay for septic shock.  Presented to [facility] ED on 01/16/2020 or shortness of breath and chest pain.  Final diagnoses were atypical chest pain, right upper quadrant abdominal pain, hepatic steatosis, elevated lipase at 216.  EKGs were unremarkable.  Serial troponins were 7 with no delta change.  CBC, CMP were unremarkable.  Respiratory panel negative, chest x-ray no acute abnormality, D-dimer negative, RUQ ultrasound evidence of hepatic steatosis.  Spoke to patient by phone today.  Reviewed the results of echocardiogram demonstrating improvement in EF back to normal.  His blood pressure is elevated today.  He states he recently saw his primary care provider who requested he increase his dosage of losartan to 25 mg daily.  Had a recent visit to the emergency room 01/16/2020 with shortness of breath and chest pain, right upper quadrant pain.  Elevated lipase and evidence of hepatic steatosis on abdominal ultrasound.  He denies any current anginal or exertional symptoms, palpitations or arrhythmias, orthostatic symptoms, stroke or TIA-like symptoms, PND, orthopnea, lower extremity edema.  Past Medical History:  Diagnosis Date   Allergy    Anxiety    Asthma    DDD (degenerative disc disease)  pt denies   Deafness in left ear    ED (erectile dysfunction)    Hyperlipidemia    Hypertension    Hypertriglyceridemia    Insomnia     Past Surgical History:  Procedure Laterality Date   ORIF ANKLE FRACTURE Right 03/01/2014    Procedure: OPEN REDUCTION INTERNAL FIXATION (ORIF) RIGHT ANKLE FRACTURE;  Surgeon: Cheral AlmasNaiping Michael Xu, MD;  Location: [facility] OR;  Service: Orthopedics;  Laterality: Right;   SPINAL CORD STIMULATOR BATTERY EXCHANGE N/A 12/24/2016   Procedure: Replacement of implantable pulse generator,;  Surgeon: Odette FractionHarkins, Paul, MD;  [facility];  Service: Neurosurgery;  Laterality: N/A;  Replacement of implantable pulse generator, possible replacement of leads on spinal cord stimulator   SPINAL CORD STIMULATOR IMPLANT     TONSILLECTOMY     WISDOM TOOTH EXTRACTION      Current Outpatient Medications  Medication Sig Dispense Refill   albuterol (VENTOLIN HFA) 108 (90 Base) MCG/ACT inhaler INHALE 4 PUFFS INTO THE LUNGS EVERY 4-6 HOURS AS NEEDED FOR UP TO 30 DAYS. 18 g 0   amLODipine (NORVASC) 5 MG tablet Take 1 tablet (5 mg total) by mouth daily. 90 tablet 1   citalopram (CELEXA) 20 MG tablet Take 1 tablet (20 mg total) by mouth daily. 90 tablet 1   diphenhydrAMINE (BENADRYL ALLERGY) 25 MG tablet Take 25 mg by mouth every 6 (six) hours as needed for allergies.      FLOVENT HFA 110 MCG/ACT inhaler INHALE 2 PUFFS INTO THE LUNGS 2 TIMES DAILY. 12 g 0   fluticasone (FLONASE) 50 MCG/ACT nasal spray Place 2 sprays into both nostrils daily. (Patient taking differently: Place 2 sprays into both nostrils daily as needed for allergies or rhinitis. ) 16 g 5   gabapentin (NEURONTIN) 300 MG capsule Take 300 mg by mouth 3 (three) times daily.     loratadine (CLARITIN) 10 MG tablet Take 10 mg by mouth daily.     methocarbamol (ROBAXIN) 500 MG tablet Take 1 tablet (500 mg total) by mouth every 6 (six) hours as needed for muscle spasms. 60 tablet 2   pantoprazole (PROTONIX) 40 MG tablet Take 1 tablet (40 mg total) by mouth at bedtime. 30 tablet 0   aspirin 81 MG chewable tablet Chew 1 tablet (81 mg total) by mouth daily. (Patient not taking: Reported on 11/10/2019)     carvedilol (COREG) 3.125 MG tablet Take 1 tablet  (3.125 mg total) by mouth 2 (two) times daily. 60 tablet 6   losartan (COZAAR) 25 MG tablet Take 0.5 tablets (12.5 mg total) by mouth daily. 15 tablet 6   No current facility-administered medications for this visit.   Allergies:  Penicillins   Social History: The patient  reports that he has never smoked. He has never used smokeless tobacco. He reports current alcohol use. He reports that he does not use drugs.   Family History: The patient's family history includes Cancer in his sister; Stroke in an other family member.   ROS:  Please see the history of present illness. Otherwise, complete review of systems is positive for none.  All other systems are reviewed and negative.   Physical Exam: VS:  BP 138/80    Pulse 90    Ht 6' 1.5" (1.867 m)    Wt 239 lb 6.4 oz (108.6 kg)    SpO2 97%    BMI 31.16 kg/m , BMI Body mass index is 31.16 kg/m.  Wt Readings from Last 3 Encounters:  11/14/19 239 lb 6.4 oz (  108.6 kg)  11/10/19 242 lb (109.8 kg)  11/01/19 256 lb 6.3 oz (116.3 kg)    Patient spoke in a normal speech pattern.  No evidence of dyspnea, cough, or wheezing noted.  ECG:  EKG on October 29, 2019 showed normal sinus rhythm rate of 77.  Recent Labwork: 10/28/2019: B Natriuretic Peptide 568.1 10/30/2019: ALT 44; AST 34 11/01/2019: BUN 26; Creatinine, Ser 1.53; Hemoglobin 11.8; Magnesium 1.8; Platelets 209; Potassium 3.9; Sodium 142     Component Value Date/Time   CHOL 199 08/19/2012 1401   TRIG 211 (H) 08/19/2012 1401   HDL 48 08/19/2012 1401   CHOLHDL 4.1 08/19/2012 1401   VLDL 42 (H) 08/19/2012 1401   LDLCALC 109 (H) 08/19/2012 1401    Other Studies Reviewed Today:   Echocardiogram 01/25/2020 1. Left ventricular ejection fraction, by estimation, is 55 to 60%. The left ventricle has normal function. The left ventricle has no regional wall motion abnormalities. Left ventricular diastolic parameters were normal. Global longitudinal strain does not track completely and is  not reported. 2. Right ventricular systolic function is normal. The right ventricular size is normal. There is normal pulmonary artery systolic pressure. The estimated right ventricular systolic pressure is 29.8 mmHg. 3. The mitral valve is grossly normal. Trivial mitral valve regurgitation. 4. The aortic valve is tricuspid. Aortic valve regurgitation is not visualized. Mild to moderate aortic valve sclerosis/calcification is present, without any evidence of aortic stenosis. 5. The inferior vena cava is normal in size with greater than 50% respiratory variability, suggesting right atrial pressure of 3 mmHg. Comparison(s): Echocardiogram done 10/28/19 showed an Ef of 40-45%.  Echocardiogram 10/28/2019  1. Left ventricular ejection fraction, by estimation, is 40 to 45%. The left ventricle has mildly decreased function. The left ventricle demonstrates global hypokinesis. There is moderate left ventricular hypertrophy. Left ventricular diastolic parameters are consistent with Grade I diastolic dysfunction (impaired relaxation). 2. Right ventricular systolic function is moderately reduced. The right ventricular size is mildly enlarged. There is normal pulmonary artery systolic pressure. 3. The mitral valve is normal in structure. Trivial mitral valve regurgitation. No evidence of mitral stenosis. 4. The aortic valve is normal in structure. Aortic valve regurgitation is not visualized. No aortic stenosis is present. 5. The inferior vena cava is normal in size with greater than 50% respiratory variability, suggesting right atrial pressure of 3 mmHg.  Assessment and Plan:  1. Cardiomyopathy, unspecified type (HCC)   2. Essential hypertension, benign   3. Suspected sleep apnea     1. Cardiomyopathy, unspecified type [facility]) Recent echocardiogram during hospitalization for septic shock.  EF of 40 to 45%, global hypokinesis, LVH, G1 DD. trivial MR.  Recent repeat echo 01/25/2020 showed EF returned  back to normal at 55 to 60%.  Trivial mitral regurgitation. Continue carvedilol 3.125 mg p.o. twice daily.  Continue losartan 25 mg p.o. daily.  2. Essential hypertension, benign Blood pressure 142/90 today.  Continue amlodipine 5 mg daily.  Increase losartan to 25 mg daily.  Patient states he recently saw his primary care provider and she suggested increasing the losartan from 12.5 to 25 mg daily.  Please refill losartan 25 mg daily.  Advised to monitor blood pressure.  If blood pressure sustained above 130/80 advised to call us and we may need to adjust antihypertensive medication.  3. Suspected sleep apnea Please re-refer the patient to pulmonology for sleep study to evaluate for OSA.  Patient states he may have canceled the appointment due to multiple doctors appointments since he was  discharged from the hospital.  History of snoring, periods of apnea.  4.  Hepatic steatosis, elevated lipase. Recent hospital visit for chest pain/abdominal pain.  Evidence of hepatic steatosis with elevated lipase at 316.  Please refer to [facility] for evaluation of hepatic cyst steatosis and elevated lipase.  Medication Adjustments/Labs and Tests Ordered: Current medicines are reviewed at length with the patient today.  Concerns regarding medicines are outlined above.   Spoke to patient about phone via telemedicine visit today for 15 minutes  Disposition: Follow-up with Dr. Wyline Mood or APP 6 months  Signed, Rennis Harding, NP 11/14/2019 1:07 PM    [facility] at [facility] [address] [place], [place], [place] [address] Phone: [phone]; Fax: [phone]

## 2020-01-25 NOTE — Telephone Encounter (Signed)
Patient informed. Copy sent to PCP °

## 2020-01-25 NOTE — Telephone Encounter (Signed)
-----   Message from Netta Neat., NP sent at 01/25/2020 12:02 PM EST ----- Please call the patient and let him know the pumping function has significantly improved and is back to normal from previous echo done in September.  Has a very mildly leaking mitral valve.  Otherwise everything looks great.  Thank you

## 2020-01-26 ENCOUNTER — Encounter: Payer: Self-pay | Admitting: Family Medicine

## 2020-01-26 ENCOUNTER — Telehealth (INDEPENDENT_AMBULATORY_CARE_PROVIDER_SITE_OTHER): Payer: BC Managed Care – PPO | Admitting: Family Medicine

## 2020-01-26 VITALS — BP 150/100 | HR 90 | Ht 73.0 in | Wt 244.0 lb

## 2020-01-26 DIAGNOSIS — R29818 Other symptoms and signs involving the nervous system: Secondary | ICD-10-CM

## 2020-01-26 DIAGNOSIS — I10 Essential (primary) hypertension: Secondary | ICD-10-CM | POA: Diagnosis not present

## 2020-01-26 DIAGNOSIS — R748 Abnormal levels of other serum enzymes: Secondary | ICD-10-CM

## 2020-01-26 DIAGNOSIS — I429 Cardiomyopathy, unspecified: Secondary | ICD-10-CM | POA: Diagnosis not present

## 2020-01-26 MED ORDER — LOSARTAN POTASSIUM 25 MG PO TABS
25.0000 mg | ORAL_TABLET | Freq: Every day | ORAL | 1 refills | Status: DC
Start: 2020-01-26 — End: 2020-03-07

## 2020-01-26 NOTE — Patient Instructions (Addendum)
Your physician recommends that you schedule a follow-up appointment in: 6 MONTHS WITH MD  Your physician has recommended you make the following change in your medication:   TAKE LOSARTAN 25 MG DAILY   You have been referred to NEUROLOGY FOR SLEEP STUDY  You have been referred to GASTROENTEROLOGY   Thank you for choosing [facility]!!

## 2020-01-30 ENCOUNTER — Encounter (INDEPENDENT_AMBULATORY_CARE_PROVIDER_SITE_OTHER): Payer: Self-pay | Admitting: *Deleted

## 2020-02-01 ENCOUNTER — Other Ambulatory Visit: Payer: Self-pay | Admitting: Family Medicine

## 2020-02-16 ENCOUNTER — Ambulatory Visit: Payer: BC Managed Care – PPO | Admitting: Family Medicine

## 2020-02-29 ENCOUNTER — Telehealth (INDEPENDENT_AMBULATORY_CARE_PROVIDER_SITE_OTHER): Payer: BC Managed Care – PPO | Admitting: Family Medicine

## 2020-02-29 DIAGNOSIS — Z029 Encounter for administrative examinations, unspecified: Secondary | ICD-10-CM

## 2020-02-29 NOTE — Progress Notes (Signed)
Visit cancelled and rescheduled per Dr. Ladona Ridgel

## 2020-03-06 ENCOUNTER — Other Ambulatory Visit: Payer: Self-pay | Admitting: *Deleted

## 2020-03-06 ENCOUNTER — Encounter: Payer: Self-pay | Admitting: Family Medicine

## 2020-03-06 ENCOUNTER — Telehealth: Payer: Self-pay | Admitting: Family Medicine

## 2020-03-06 ENCOUNTER — Other Ambulatory Visit: Payer: Self-pay

## 2020-03-06 ENCOUNTER — Telehealth (INDEPENDENT_AMBULATORY_CARE_PROVIDER_SITE_OTHER): Payer: BC Managed Care – PPO | Admitting: Family Medicine

## 2020-03-06 DIAGNOSIS — I10 Essential (primary) hypertension: Secondary | ICD-10-CM

## 2020-03-06 DIAGNOSIS — M5416 Radiculopathy, lumbar region: Secondary | ICD-10-CM | POA: Diagnosis not present

## 2020-03-06 MED ORDER — LOSARTAN POTASSIUM 50 MG PO TABS: 50.0000 mg | ORAL_TABLET | Freq: Every day | ORAL | 0 refills | Status: DC

## 2020-03-06 NOTE — Telephone Encounter (Signed)
-----   Message from [name], NP sent at 03/06/2020 10:58 AM EST ----- Dr. Ladona Ridgel,  I think this patient was one of yours from when you left to take care of Ruby. I had a phone visit today with him and he is requesting to go back on Lisinopril. He was taken off when he was hospitalized with septic shock in Sept 2021.  He says he got labs from [facility] that shows normal kidney function now-- he said you saw these labs.  I am not comfortable putting him back on ACE-I today.  I told him I am punting this to you for your input and that someone would get back to him. He also sees cardiology.   His BPs are not good today. I am letting you decide about this!!!  :)   Thanks, KD

## 2020-03-06 NOTE — Telephone Encounter (Signed)
Patient notified of Dr. Lubertha Basque recommendation and verbalized understanding. Refill sent to [facility] and pt will contact cardio.

## 2020-03-06 NOTE — Progress Notes (Signed)
Patient ID: Trevor Rollins, male    DOB: 1970-07-18, 50 y.o.   MRN: [id number]   No chief complaint on file. Virtual Visit via Telephone Note  I connected with Trevor Rollins on 03/06/20 at 10:40 AM EST by telephone and verified that I am speaking with the correct person using two identifiers.  Location: Patient: home Provider: office   I discussed the limitations, risks, security and privacy concerns of performing an evaluation and management service by telephone and the availability of in person appointments. I also discussed with the patient that there may be a patient responsible charge related to this service. The patient expressed understanding and agreed to proceed.   History of Present Illness:    Observations/Objective:   Assessment and Plan:   Follow Up Instructions:    I discussed the assessment and treatment plan with the patient. The patient was provided an opportunity to ask questions and all were answered. The patient agreed with the plan and demonstrated an understanding of the instructions.   The patient was advised to call back or seek an in-person evaluation if the symptoms worsen or if the condition fails to improve as anticipated.  I provided 10 minutes of non-face-to-face time during this encounter.   [name], NP   Subjective:  CC: blood pressure management, wants to go back on Lisinopril   This is a chronic problem.  Presents today via telephone visit for medication management for hypertension.  Blood pressure readings are not optimal, 130/90 and 150/100 per patient report.  September 2021, patient experienced septic shock,  in the hospital where he was taken off of his ACE inhibitor for decreased kidney function.  He reports that he wishes to go back on this ACE inhibitor for better blood pressure control.  Reports that he had labs drawn through [facility] with normal kidney function, I am unable to see these labs today.  His last saw  cardiology December 2021 where losartan was increased at that time, ACE inhibitor was not restarted.  He denies fever, chills, chest pain, shortness of breath.  Also wishes to have an orthopedic referral for back pain.    HPIfollow up on blood pressure.  Pt checked bp at home and it was 130/90. States it usually runs 150/100.   Would like a referral for back pain and sciatica. Would like a referral to [name] [phone].    Medical History Trevor Rollins has a past medical history of Allergy, Anxiety, Asthma, DDD (degenerative disc disease), Deafness in left ear, ED (erectile dysfunction), Hyperlipidemia, Hypertension, Hypertriglyceridemia, and Insomnia.   Outpatient Encounter Medications as of 03/06/2020  Medication Sig  . albuterol (VENTOLIN HFA) 108 (90 Base) MCG/ACT inhaler INHALE 4 PUFFS INTO THE LUNGS EVERY 4-6 HOURS AS NEEDED FOR UP TO 30 DAYS.  Marland Kitchen amLODipine (NORVASC) 5 MG tablet Take 1 tablet (5 mg total) by mouth daily.  . carvedilol (COREG) 3.125 MG tablet Take 1 tablet (3.125 mg total) by mouth 2 (two) times daily.  . citalopram (CELEXA) 20 MG tablet TAKE 1 TABLET BY MOUTH ONCE DAILY.  . diazepam (VALIUM) 5 MG tablet Take 1 tablet (5 mg total) by mouth every 12 (twelve) hours as needed for anxiety or muscle spasms.  . diphenhydrAMINE (BENADRYL) 25 MG tablet Take 25 mg by mouth every 6 (six) hours as needed for allergies.   . fluticasone (FLONASE) 50 MCG/ACT nasal spray Place 2 sprays into both nostrils daily. (Patient taking differently: Place 2 sprays into both  nostrils daily as needed for allergies or rhinitis.)  . fluticasone (FLOVENT HFA) 110 MCG/ACT inhaler Inhale 2 puffs into the lungs in the morning and at bedtime.  . gabapentin (NEURONTIN) 300 MG capsule Take 300 mg by mouth 3 (three) times daily.  Marland Kitchen losartan (COZAAR) 25 MG tablet Take 1 tablet (25 mg total) by mouth daily.  . methocarbamol (ROBAXIN) 500 MG tablet Take 1 tablet (500 mg total) by mouth every 6 (six) hours  as needed for muscle spasms.   No facility-administered encounter medications on file as of 03/06/2020.     Review of Systems  Constitutional: Positive for fatigue. Negative for chills and fever.  Respiratory: Negative for shortness of breath.   Cardiovascular: Negative for chest pain.  Musculoskeletal: Positive for back pain.     Vitals There were no vitals taken for this visit. 130/90 150/100 per patient report Objective:   Physical Exam No PE   Assessment and Plan   1. Lumbar radiculopathy, chronic - Ambulatory referral to Orthopedic Surgery  2. Primary hypertension    Blood pressure not optimal.  Due to kidney function, I am uncomfortable restarting ACE inhibitor.  Information sent to primary care physician, Dr. Ladona Ridgel to make the decision concerning restarting ACE inhibitor.  It is possible this decision will be deferred to cardiology.  Reports that kidney function is now back to normal.   Agrees with plan of care discussed today. Understands warning signs to seek further care: chest pain, shortness of breath, any significant change in health.  Understands that once Dr. Ladona Ridgel has made a decision, someone will get back to him concerning restarting ACE inhibitor versus cardiology referral for that decision.  He last saw cardiology January 26, 2020.  Wishes to have orthopedic referral for lumbar pain, referral sent.  Trevor Bodo, NP 03/06/2020

## 2020-03-07 ENCOUNTER — Encounter: Payer: Self-pay | Admitting: Neurology

## 2020-03-07 ENCOUNTER — Ambulatory Visit: Payer: BC Managed Care – PPO | Admitting: Neurology

## 2020-03-07 VITALS — BP 128/83 | HR 90 | Ht 73.5 in | Wt 246.0 lb

## 2020-03-07 DIAGNOSIS — R571 Hypovolemic shock: Secondary | ICD-10-CM

## 2020-03-07 DIAGNOSIS — I509 Heart failure, unspecified: Secondary | ICD-10-CM

## 2020-03-07 DIAGNOSIS — R6521 Severe sepsis with septic shock: Secondary | ICD-10-CM

## 2020-03-07 DIAGNOSIS — A419 Sepsis, unspecified organism: Secondary | ICD-10-CM | POA: Insufficient documentation

## 2020-03-07 DIAGNOSIS — I429 Cardiomyopathy, unspecified: Secondary | ICD-10-CM

## 2020-03-07 DIAGNOSIS — R579 Shock, unspecified: Secondary | ICD-10-CM | POA: Insufficient documentation

## 2020-03-07 DIAGNOSIS — J683 Other acute and subacute respiratory conditions due to chemicals, gases, fumes and vapors: Secondary | ICD-10-CM

## 2020-03-07 DIAGNOSIS — R0683 Snoring: Secondary | ICD-10-CM | POA: Diagnosis not present

## 2020-03-07 DIAGNOSIS — R0681 Apnea, not elsewhere classified: Secondary | ICD-10-CM

## 2020-03-07 NOTE — Progress Notes (Signed)
SLEEP MEDICINE CLINIC    Provider:  Melvyn Novas, MD  Primary Care Physician:  Annalee Genta, DO [address] [place] [address]     Referring Provider: [facility] , Dr Wyline Mood.         Chief Complaint according to patient   Patient presents with:    . New Patient (Initial Visit)     Pt alone, rm 11 patient presents today referred from cardiology, wakes up frequently during the night. Patient used to could just sleep 6 to 7 hours and would be okay but finds now that he averages about 10 hours of sleep. During that time he wakes up frequently. And when he wakes up to start the day still feels tired. Never had a sleep study. Wife has told him that he snores and she has witnessed apnea events.      HISTORY OF PRESENT ILLNESS:  Trevor Rollins is a 50 - year- old Caucasian male patient is seen upon a referral on 03/07/2020 from NP [facility], Cardiology.   Chief concern according to patient :  The patient was hospitalized at [facility], Mr. Trevor Rollins was admitted with septic shock he had been presenting on 28 October 2019 finally to the hospital following 3 days of nausea, vomiting diarrhea and severe dehydration intense chills, was first seen in urgent care with a blood pressure of only 85/68 mmHg in the emergency room he was tachypneic his oxygen pressures were very low and he had lactic acidosis.  His creatinine had risen 201.47 his AST to 89 his ALT to 61.  Initial troponin of 208.  Covid test initially negative, chest x-ray showed cardiomegaly without edema and left lower lobe atelectasis was noted.  He was placed on pressors and stress dose steroids along with fluids IV resuscitation.  Echocardiogram demonstrated an EF of 40% with global hypokinesis.  Repeat Covid testing was negative that time troponins troponins were elevated starting at 208-230 then 2 2268 and finally 1664.  Dr. 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  And he recommended an  outpatient sleep study.   Repeat Echocardiogram was performed after chest pain in 01-2020. Echo showed recovery. He attributes the stress to his membership in the local [employer].      Trevor Rollins  a right handed  Caucasian male who  has a past medical history of Allergy, Anxiety, Asthma, DDD (degenerative disc disease), Deafness in left ear, ED (erectile dysfunction), Hyperlipidemia, Hypertension, Hypertriglyceridemia, and Insomnia. Recent septic shock. CHF. He has chronic sciatica and is followed by neurosurgeon.  The patient also has deafness in the left ear, there has been a history of he has insomnia but only many years ago did he have insomnia related to chronic pain and this has been treated by a separate specialist surgeon was Dr. Odette Fraction he is a neurosurgery patient and had an implanted pulse generator.  Battery exchanged in 12-24-2016.  He had an oral ankle fracture in 2016 wisdom tooth were extracted.  His medication list was reviewed.  Echocardiogram was reviewed 01-25-20 by estimation back to 55% left ventricle normal function no regional wall motion abnormalities noted anymore      Family medical /sleep history: No other family member on CPAP with OSA, insomnia, sleep walkers.    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 One  dog and one cat. Tobacco use; none .  ETOH use ; 2 a day. Caffeine intake in form of  1 coke in AM. Regular exercise is limited..    Sleep habits are as follows: The patient's dinner time is between 6-7 PM. The patient goes to bed at 11 PM and continues to sleep for 1-2  hours, wakes many times, sue to pain, due to sounds, snoring.   The preferred sleep position is left side and supine , with the support of 1 pillow.  Dreams are reportedly vivid- lucid under medication.  7.30-9 AM is the usual rise time. The patient wakes up  spontaneously unless he has meetings- he feels goes if he sleeps until 10 AM  He reports not feeling refreshed or restored in AM, with symptoms such as dry mouth, and residual fatigue.  Naps are taken frequently in PM , lasting from 60 minutes and are more refreshing than nocturnal sleep.    Review of Systems: Out of a complete 14 system review, the patient complains of only the following symptoms, and all other reviewed systems are negative.:  Fatigue, sleepiness , snoring, fragmented sleep- hypersomnia.    How likely are you to doze in the following situations: 0 = not likely, 1 = slight chance, 2 = moderate chance, 3 = high chance   Sitting and Reading? Watching Television? Sitting inactive in a public place (theater or meeting)? As a passenger in a car for an hour without a break? Lying down in the afternoon when circumstances permit? Sitting and talking to someone? Sitting quietly after lunch without alcohol? In a car, while stopped for a few minutes in traffic?   Total = 9/ 24 points - reports some sleep attacks.   FSS endorsed at 41/ 63 points.   Social History   Socioeconomic History  . Marital status: Married    Spouse name: Not on file  . Number of children: Not on file  . Years of education: Not on file  . Highest education level: Not on file  Occupational History  . Not on file  Tobacco Use  . Smoking status: Never Smoker  . Smokeless tobacco: Never Used  Vaping Use  . Vaping Use: Never used  Substance and Sexual Activity  . Alcohol use: Yes    Comment: occasional  . Drug use: No  . Sexual activity: Not on file  Other Topics Concern  . Not on file  Social History Narrative  . Not on file   Social Determinants of Health   Financial Resource Strain: Not on file  Food Insecurity: Not on file  Transportation Needs: Not on file  Physical Activity: Not on file  Stress: Not on file  Social Connections: Not on file    Family History  Problem Relation Age  of Onset  . Cancer Sister   . Stroke Other     Past Medical History:  Diagnosis Date  . Allergy   . Anxiety   . Asthma   . DDD (degenerative disc disease)    pt denies  . Deafness in left ear   . ED (erectile dysfunction)   . Hyperlipidemia   . Hypertension   . Hypertriglyceridemia   . Insomnia     Past Surgical History:  Procedure Laterality Date  . ORIF ANKLE FRACTURE Right 03/01/2014   Procedure: OPEN REDUCTION INTERNAL FIXATION (ORIF) RIGHT ANKLE FRACTURE;  Surgeon: Cheral Almas, MD;  Location: [facility] OR;  Service: Orthopedics;  Laterality: Right;  . SPINAL  CORD STIMULATOR BATTERY EXCHANGE N/A 12/24/2016   Procedure: Replacement of implantable pulse generator,;  Surgeon: Odette Fraction, MD;  Location: [facility] OR;  Service: Neurosurgery;  Laterality: N/A;  Replacement of implantable pulse generator, possible replacement of leads on spinal cord stimulator  . SPINAL CORD STIMULATOR IMPLANT    . TONSILLECTOMY    . WISDOM TOOTH EXTRACTION       Current Outpatient Medications on File Prior to Visit  Medication Sig Dispense Refill  . albuterol (VENTOLIN HFA) 108 (90 Base) MCG/ACT inhaler INHALE 4 PUFFS INTO THE LUNGS EVERY 4-6 HOURS AS NEEDED FOR UP TO 30 DAYS. 18 g 0  . amLODipine (NORVASC) 5 MG tablet Take 1 tablet (5 mg total) by mouth daily. 90 tablet 1  . citalopram (CELEXA) 20 MG tablet TAKE 1 TABLET BY MOUTH ONCE DAILY. 30 tablet 0  . diazepam (VALIUM) 5 MG tablet Take 1 tablet (5 mg total) by mouth every 12 (twelve) hours as needed for anxiety or muscle spasms. 30 tablet 1  . diphenhydrAMINE (BENADRYL) 25 MG tablet Take 25 mg by mouth every 6 (six) hours as needed for allergies.     . fluticasone (FLONASE) 50 MCG/ACT nasal spray Place 2 sprays into both nostrils daily. (Patient taking differently: Place 2 sprays into both nostrils daily as needed for allergies or rhinitis.) 16 g 5  . fluticasone (FLOVENT HFA) 110 MCG/ACT inhaler Inhale 2 puffs into the lungs in the morning and  at bedtime. 12 g 0  . gabapentin (NEURONTIN) 300 MG capsule Take 300 mg by mouth 3 (three) times daily.    Marland Kitchen losartan (COZAAR) 50 MG tablet Take 1 tablet (50 mg total) by mouth daily. 30 tablet 0  . methocarbamol (ROBAXIN) 500 MG tablet Take 1 tablet (500 mg total) by mouth every 6 (six) hours as needed for muscle spasms. 60 tablet 2  . carvedilol (COREG) 3.125 MG tablet Take 1 tablet (3.125 mg total) by mouth 2 (two) times daily. 60 tablet 6   No current facility-administered medications on file prior to visit.    Allergies  Allergen Reactions  . Penicillins Other (See Comments)    Childhood reaction "redness"  Has patient had a PCN reaction causing immediate rash, facial/tongue/throat swelling, SOB or lightheadedness with hypotension: No Has patient had a PCN reaction causing severe rash involving mucus membranes or skin necrosis: No Has patient had a PCN reaction that required hospitalization: No Has patient had a PCN reaction occurring within the last 10 years: No If all of the above answers are "NO", then may proceed with Cephalosporin use.     Physical exam:  Today's Vitals   03/07/20 1459  BP: 128/83  Pulse: 90  Weight: 246 lb (111.6 kg)  Height: 6' 1.5" (1.867 m)   Body mass index is 32.02 kg/m.   Wt Readings from Last 3 Encounters:  03/07/20 246 lb (111.6 kg)  01/26/20 244 lb (110.7 kg)  01/23/20 243 lb 3.2 oz (110.3 kg)     Ht Readings from Last 3 Encounters:  03/07/20 6' 1.5" (1.867 m)  01/26/20 6\' 1"  (1.854 m)  01/23/20 6' 1.05" (1.855 m)      General: The patient is awake, alert and appears  in chronic distress. The patient is well groomed. Head: Normocephalic, atraumatic.  Neck is supple. Mallampati 3,  neck circumference: 17. 75 inches.  Nasal airflow congestion .  Retrognathia is mildly . Dental status: intact.  There is a jaw tremor.  Cardiovascular:  Regular rate and cardiac  rhythm by pulse, without distended neck veins. Respiratory: Lungs are  clear to auscultation.  Skin:  Without evidence of ankle edema, or rash. Trunk: The patient's posture is erect.   Neurologic exam : The patient is awake and alert, oriented to place and time.   Memory subjective described as intact.  Attention span & concentration ability appears normal.  Speech is fluent,  without dysarthria, dysphonia or aphasia.  Mood and affect are appropriate.   Cranial nerves: no loss of smell or taste reported  Pupils are equal and briskly reactive to light. Funduscopic exam deferred.   Extraocular movements in vertical and horizontal planes were intact and without nystagmus. No Diplopia. Visual fields by finger perimetry are intact. Hearing was intact to soft voice and finger rubbing.   Facial sensation intact to fine touch.  Facial motor strength is symmetric and tongue and uvula move midline.  Neck ROM : rotation, tilt and flexion extension were normal for age and shoulder shrug was symmetrical.    Motor exam:  Symmetric bulk, tone and ROM in upper extremities- I ma not  evluating gait and strength for the lower extremities. .   Normal tone without cog wheeling, symmetric grip strength .   Sensory:  Fine touch  and vibration were normal.  Proprioception tested in the upper extremities was normal.   Coordination: Rapid alternating movements in the fingers/hands were of normal speed.  The Finger-to-nose maneuver was intact without evidence of ataxia, dysmetria but fine amplitude bilateral tremor.   Gait and station: Patient could barely rise unassisted , walked with crutches assistive device.  Stance is of normal width/ base and the patient turned with steps.  Toe and heel walk were deferred.  Deep tendon reflexes: in the  upper and lower extremities are symmetric and intact.  Babinski response was deferred .       After spending a total time of  55  minutes face to face and additional time for physical and neurologic examination, review of laboratory  studies,  personal review of imaging studies, reports and results of other testing and review of referral information / records as far as provided in visit, I have established the following assessments:  1) Interesting history of SEPSIS and CARDIOMYOPATHY , Covid negative -  2) patient has been taking prn opiates, and he is not sleeping well. He has a lot of twitching, snoring and EDS.  3) he has poor sleep quality but also needs more sleep to feel refreshed.     My Plan is to proceed with:  1) attended sleep study if possible, risk of central apnea in the setting of acquired cardiomyopathy-  CHF.  HST is also ordered.    I would like to thank Dr Wyline Mood, MD  for allowing me to meet with and to take care of this pleasant patient.   In short, Trevor Rollins is presenting with non restorative sleep, snoring and possible  Central apnea. , CC: Rv in 3-4 month.  Electronically signed by: Melvyn Novas, MD 03/07/2020 3:12 PM  [facility] Board certified by The [employer] of Sleep Medicine and Diplomate of the [employer] of Sleep Medicine. Board certified In Neurology through the ABPN, Fellow of the [employer] of Neurology. Medical Director of [facility].

## 2020-03-07 NOTE — Patient Instructions (Signed)

## 2020-03-12 ENCOUNTER — Telehealth: Payer: Self-pay

## 2020-03-12 NOTE — Telephone Encounter (Signed)
Calling to schedule sleep study. 

## 2020-03-17 ENCOUNTER — Other Ambulatory Visit: Payer: Self-pay | Admitting: Family Medicine

## 2020-04-05 ENCOUNTER — Other Ambulatory Visit: Payer: Self-pay

## 2020-04-05 ENCOUNTER — Ambulatory Visit (INDEPENDENT_AMBULATORY_CARE_PROVIDER_SITE_OTHER): Payer: BC Managed Care – PPO | Admitting: Neurology

## 2020-04-05 DIAGNOSIS — I429 Cardiomyopathy, unspecified: Secondary | ICD-10-CM

## 2020-04-05 DIAGNOSIS — G4761 Periodic limb movement disorder: Secondary | ICD-10-CM

## 2020-04-05 DIAGNOSIS — A419 Sepsis, unspecified organism: Secondary | ICD-10-CM

## 2020-04-05 DIAGNOSIS — G4733 Obstructive sleep apnea (adult) (pediatric): Secondary | ICD-10-CM

## 2020-04-05 DIAGNOSIS — J683 Other acute and subacute respiratory conditions due to chemicals, gases, fumes and vapors: Secondary | ICD-10-CM

## 2020-04-05 DIAGNOSIS — R0683 Snoring: Secondary | ICD-10-CM

## 2020-04-05 DIAGNOSIS — R0681 Apnea, not elsewhere classified: Secondary | ICD-10-CM

## 2020-04-05 DIAGNOSIS — I509 Heart failure, unspecified: Secondary | ICD-10-CM

## 2020-04-10 ENCOUNTER — Telehealth: Payer: Self-pay

## 2020-04-10 ENCOUNTER — Other Ambulatory Visit: Payer: Self-pay | Admitting: Family Medicine

## 2020-04-10 MED ORDER — LOSARTAN POTASSIUM 50 MG PO TABS: 50.0000 mg | ORAL_TABLET | Freq: Every day | ORAL | 0 refills | Status: DC

## 2020-04-10 NOTE — Telephone Encounter (Signed)
Pt contacted and verbalized understanding.  

## 2020-04-10 NOTE — Telephone Encounter (Signed)
Pt losartan (COZAAR) 50 MG tablet was bumped up meds and blood pressure is perfect now he has appt on 03/10 @ 10:30 with Dr [facility] and he needs one more refill for this until he can get into see them.  losartan (COZAAR) 50 MG tablet [facility] [place],  - [address]   Pt call back [phone]

## 2020-04-17 DIAGNOSIS — G4733 Obstructive sleep apnea (adult) (pediatric): Secondary | ICD-10-CM | POA: Insufficient documentation

## 2020-04-17 DIAGNOSIS — G4761 Periodic limb movement disorder: Secondary | ICD-10-CM | POA: Insufficient documentation

## 2020-04-17 NOTE — Procedures (Signed)
PATIENT'S NAME:  Trevor Rollins, Trevor Rollins DOB:      Oct 22, 1970      MR#:    [id number]     DATE OF RECORDING: 04/05/2020 REFERRING M.D.:  Donette Larry, MD and Ramon Dredge., NP Study Performed:   Baseline Polysomnogram HISTORY:  Trevor Rollins is a 50 - year- old Caucasian male patient is seen upon a referral from [facility].  He wakes up frequently during the night, he used to could just sleep 6 to 7 hours and would be okay but finds now that he averages about 10 hours of sleep and during that time he wakes up frequently and wakes still feeling tired. His wife has told him that he snores and she has witnessed apnea events. He has an implanted spinal stimulator for pain, Dr Ollen Bowl.  Chief concern :  The patient was hospitalized at [facility], Trevor Rollins was admitted with septic shock he had been presenting on 28 October 2019 finally to the hospital following 3 days of nausea, vomiting diarrhea and severe dehydration intense chills, was first seen in urgent care with a blood pressure of only 85/68 mmHg. Covid test initially negative, chest x-ray showed cardiomegaly without edema and left lower lobe atelectasis was noted.  He was placed on pressors and stress dose steroids along with fluids IV resuscitation. Echocardiogram demonstrated an EF of 40% with global hypokinesis. Stress induced cardiomyopathy?  In the emergency room he was tachypneic his oxygen pressures were very low and he had lactic acidosis.  His creatinine had risen 201.47 his AST to 89, his ALT to 61.  Initial troponin of 208.    The patient endorsed the Epworth Sleepiness Scale at 10/24 points.  FSS at  41/63- high.  The patient's weight 246 pounds with a height of 74 (inches), resulting in a BMI of 32. kg/m2. The patient's neck circumference measured 17.8 inches.  CURRENT MEDICATIONS: Ventolin, Norvasc, Celexa, Valium, Benadryl, Flonase, Flovent, Neurontin, Cozaar, Robaxin, Coreg   PROCEDURE:  This is a multichannel digital polysomnogram utilizing  the Somnostar 11.2 system.  Electrodes and sensors were applied and monitored per AASM Specifications.   EEG, EOG, Chin and Limb EMG, were sampled at 200 Hz.  ECG, Snore and Nasal Pressure, Thermal Airflow, Respiratory Effort, CPAP Flow and Pressure, Oximetry was sampled at 50 Hz. Digital video and audio were recorded.      BASELINE STUDY: Lights Out was at 21:55 and Lights On at 04:47.  Total recording time (TRT) was 413 minutes, with a total sleep time (TST) of 343.5 minutes.   The patient's sleep latency was 46.5 minutes.  REM latency was 244 minutes.  The sleep efficiency was 83.2 %.     SLEEP ARCHITECTURE: WASO (Wake after sleep onset) was 44 minutes.  There were 8 minutes in Stage N1, 263.5 minutes Stage N2, 45 minutes Stage N3 and 27 minutes in Stage REM.  The percentage of Stage N1 was 2.3%, Stage N2 was 76.7%, Stage N3 was 13.1% and Stage R (REM sleep) was 7.9%.   RESPIRATORY ANALYSIS:  There were a total of 44 respiratory events:  29 obstructive apneas, 0 central apneas and 2 mixed apneas with 13 hypopneas.      The total APNEA/HYPOPNEA INDEX (AHI) was 7.7/hour. 19 events occurred in REM sleep and 23 events in NREM. The REM AHI was 42.2 /hour, versus a non-REM AHI of 4.7.  The patient spent 166.5 minutes of total sleep time in the supine position and 177 minutes in non-supine. The supine AHI was  14.1/h versus a non-supine AHI of 1.7.  OXYGEN SATURATION & C02:  The Wake baseline 02 saturation was 97%, with the lowest being 52%. Time spent below 89% saturation equaled 3 minutes. The arousals were noted as: 54 were spontaneous, 28 were associated with PLMs, 14 were associated with respiratory events. The patient had a total of 154 Periodic Limb Movements. The Periodic Limb Movement (PLM) Arousal index was 4.9/hour. Audio and video analysis did not show any abnormal or unusual movements, behaviors, phonations or vocalizations.  The patient took bathroom breaks. Mild Snoring was noted. EKG was in  keeping with normal sinus rhythm (NSR).  IMPRESSION:  1. This PSG found mild Obstructive Sleep Apnea (OSA) at an AHI of 7.7/h and REM AHI of 42.2/h., mild snoring, and severe Periodic Limb Movement Disorder (PLMD). 2. 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  3. No prolonged hypoxemia was noted.  RECOMMENDATIONS:  1. CPAP titration study is recommended to optimize therapy.   2. Plan B is autotitration CPAP device, nasal pillow or nasal mask, 5-15 cm water, 3 cm EPR , short RAMP, heated humidity.  3. The patient needs to avoid supine sleep. 4. Dental device or inspire implant will not resolve a REM sleep dependent forms of apnea.    I certify that I have reviewed the entire raw data recording prior to the issuance of this report in accordance with the Standards of Accreditation of the American Academy of Sleep Medicine (AASM)   Melvyn Novas, MD Diplomat, American Board of Neurology  Diplomat, American Board of Sleep Medicine Wellsite geologist, [employer] [facility] at [employer], [employer] of Sleep Medicine

## 2020-04-17 NOTE — Progress Notes (Signed)
Cardiology Office Note  Date: 11/14/2019   ID: Trevor Rollins, DOB [birth year], MRN [id number]  PCP:  Annalee Genta, DO  Cardiologist:  Dina Rich, MD Electrophysiologist:  None   Chief Complaint: BP elevation.  Recently started on losartan per PCP request  History of Present Illness: Trevor Rollins is a 50 y.o. male with a history of HTN, HLD, obesity, asthma,  Recent hospital admission 10/28/2019 with presenting complaints of 3 days of N/V/D.  He awakened on 10/26/2019 with intense shivers, feeling feverish and vomiting episodes with diarrhea.  Was seen in urgent care the next day with low blood pressure of 85/68.  In the emergency room he was tachypneic with low blood pressure of 79/55 with labs showing lactic acid of 4.3, sodium 129, creatinine 1.47, CO2 18, AST 89, ALT 61, mag 1.4, phosphorus 1.8, lipase 64, BNP 568, CBC with WBC of 15 and hemoglobin of 12.2.  Platelets of 148.  Initial troponin of 208.  Covid test negative.  Chest x-ray showed cardiomegaly without overt edema and left lower lobe atelectasis.  He was briefly on pressors and stress dose steroids along with fluid resuscitation.  Pressors were discontinued after adequate fluid resuscitation.  Echocardiogram demonstrating EF of 40 to 45% with global hypokinesis.  At bedtime troponins were  elevated at 208 -[id number]--[id number].  BNP was 568, Covid was negative.  Dr. Wyline Mood was consulted.  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  Patient was to start low-dose beta-blocker and low-dose ARB in the near future.  Plan was for 3 months medical therapy and repeat echo.  If ongoing dysfunction would consider ischemic evaluation at that time.  Dr. Wyline Mood recommended outpatient sleep study.  He suspected demand ischemia as explanation for elevated troponins.   Presented last visit status post hospital discharge stated he felt like a million bucks. Did have some minor exercise intolerance.  Denied any anginal or exertional symptoms,  palpitations or arrhythmias, orthostatic symptoms, CVA or TIA-like symptoms, PND or orthopnea, lower extremity edema, claudication-like symptoms. He was having some sciatica and recently had a steroidal injection for this issue.  Denied any lower extremity edema, DVT or PE-like symptoms.  Echo showed reduced systolic function during recent hospital stay for septic shock.  Presented to [facility] ED on 01/16/2020 or shortness of breath and chest pain.  Final diagnoses were atypical chest pain, right upper quadrant abdominal pain, hepatic steatosis, elevated lipase at 216.  EKGs were unremarkable.  Serial troponins were 7 with no delta change.  CBC, CMP were unremarkable.  Respiratory panel negative, chest x-ray no acute abnormality, D-dimer negative, RUQ ultrasound evidence of hepatic steatosis.  Spoke to patient by phone today.  Reviewed the results of echocardiogram demonstrating improvement in EF back to normal.  His blood pressure is elevated today.  He states he recently saw his primary care provider who requested he increase his dosage of losartan to 25 mg daily.  Had a recent visit to the emergency room 01/16/2020 with shortness of breath and chest pain, right upper quadrant pain.  Elevated lipase and evidence of hepatic steatosis on abdominal ultrasound.  He denies any current anginal or exertional symptoms, palpitations or arrhythmias, orthostatic symptoms, stroke or TIA-like symptoms, PND, orthopnea, lower extremity edema.  He is being referred back by his primary care provider today for increased blood pressure and losartan increased to 50 mg by PCP.  States his blood pressure still remains.  Today's blood pressure 142/80.  He denies any other issues other than  back problems from disc degeneration.  No anginal or exertional symptoms, palpitations or arrhythmias, orthostatic symptoms, CVA or TIA-like symptoms, PND, orthopnea, bleeding, DVT or PE-like symptoms, lower extremity edema.  BP today on  arrival 142/84.  He states he forgets to take the evening dose of carvedilol and sometimes doubles up on the a.m. dose due to forgetting the nighttime dosage.   Past Medical History:  Diagnosis Date  . Allergy   . Anxiety   . Asthma   . DDD (degenerative disc disease)    pt denies  . Deafness in left ear   . ED (erectile dysfunction)   . Hyperlipidemia   . Hypertension   . Hypertriglyceridemia   . Insomnia     Past Surgical History:  Procedure Laterality Date  . ORIF ANKLE FRACTURE Right 03/01/2014   Procedure: OPEN REDUCTION INTERNAL FIXATION (ORIF) RIGHT ANKLE FRACTURE;  Surgeon: Cheral AlmasNaiping Michael Xu, MD;  Location: [facility] OR;  Service: Orthopedics;  Laterality: Right;  . SPINAL CORD STIMULATOR BATTERY EXCHANGE N/A 12/24/2016   Procedure: Replacement of implantable pulse generator,;  Surgeon: Odette FractionHarkins, Paul, MD;  [facility];  Service: Neurosurgery;  Laterality: N/A;  Replacement of implantable pulse generator, possible replacement of leads on spinal cord stimulator  . SPINAL CORD STIMULATOR IMPLANT    . TONSILLECTOMY    . WISDOM TOOTH EXTRACTION      Current Outpatient Medications  Medication Sig Dispense Refill  . albuterol (VENTOLIN HFA) 108 (90 Base) MCG/ACT inhaler INHALE 4 PUFFS INTO THE LUNGS EVERY 4-6 HOURS AS NEEDED FOR UP TO 30 DAYS. 18 g 0  . amLODipine (NORVASC) 5 MG tablet Take 1 tablet (5 mg total) by mouth daily. 90 tablet 1  . citalopram (CELEXA) 20 MG tablet Take 1 tablet (20 mg total) by mouth daily. 90 tablet 1  . diphenhydrAMINE (BENADRYL ALLERGY) 25 MG tablet Take 25 mg by mouth every 6 (six) hours as needed for allergies.     Marland Kitchen. FLOVENT HFA 110 MCG/ACT inhaler INHALE 2 PUFFS INTO THE LUNGS 2 TIMES DAILY. 12 g 0  . fluticasone (FLONASE) 50 MCG/ACT nasal spray Place 2 sprays into both nostrils daily. (Patient taking differently: Place 2 sprays into both nostrils daily as needed for allergies or rhinitis. ) 16 g 5  . gabapentin (NEURONTIN) 300 MG capsule Take 300  mg by mouth 3 (three) times daily.    Marland Kitchen. loratadine (CLARITIN) 10 MG tablet Take 10 mg by mouth daily.    . methocarbamol (ROBAXIN) 500 MG tablet Take 1 tablet (500 mg total) by mouth every 6 (six) hours as needed for muscle spasms. 60 tablet 2  . pantoprazole (PROTONIX) 40 MG tablet Take 1 tablet (40 mg total) by mouth at bedtime. 30 tablet 0  . aspirin 81 MG chewable tablet Chew 1 tablet (81 mg total) by mouth daily. (Patient not taking: Reported on 11/10/2019)    . carvedilol (COREG) 3.125 MG tablet Take 1 tablet (3.125 mg total) by mouth 2 (two) times daily. 60 tablet 6  . losartan (COZAAR) 25 MG tablet Take 0.5 tablets (12.5 mg total) by mouth daily. 15 tablet 6   No current facility-administered medications for this visit.   Allergies:  Penicillins   Social History: The patient  reports that he has never smoked. He has never used smokeless tobacco. He reports current alcohol use. He reports that he does not use drugs.   Family History: The patient's family history includes Cancer in his sister; Stroke in an other family member.  ROS:  Please see the history of present illness. Otherwise, complete review of systems is positive for none.  All other systems are reviewed and negative.   Physical Exam: VS:  BP 138/80   Pulse 90   Ht 6' 1.5" (1.867 m)   Wt 239 lb 6.4 oz (108.6 kg)   SpO2 97%   BMI 31.16 kg/m , BMI Body mass index is 31.16 kg/m.  Wt Readings from Last 3 Encounters:  11/14/19 239 lb 6.4 oz (108.6 kg)  11/10/19 242 lb (109.8 kg)  11/01/19 256 lb 6.3 oz (116.3 kg)     General: Patient appears comfortable at rest. Neck: Supple, no elevated JVP or carotid bruits, no thyromegaly. Lungs: Clear to auscultation, nonlabored breathing at rest. Cardiac: Regular rate and rhythm, no S3 or significant systolic murmur, no pericardial rub. Extremities: No pitting edema, distal pulses 2+. Skin: Warm and dry. Musculoskeletal: No kyphosis. Neuropsychiatric: Alert and oriented x3,  affect grossly appropriate   ECG:  EKG on October 29, 2019 showed normal sinus rhythm rate of 77.  Recent Labwork: 10/28/2019: B Natriuretic Peptide 568.1 10/30/2019: ALT 44; AST 34 11/01/2019: BUN 26; Creatinine, Ser 1.53; Hemoglobin 11.8; Magnesium 1.8; Platelets 209; Potassium 3.9; Sodium 142     Component Value Date/Time   CHOL 199 08/19/2012 1401   TRIG 211 (H) 08/19/2012 1401   HDL 48 08/19/2012 1401   CHOLHDL 4.1 08/19/2012 1401   VLDL 42 (H) 08/19/2012 1401   LDLCALC 109 (H) 08/19/2012 1401    Other Studies Reviewed Today:   Echocardiogram 01/25/2020 1. Left ventricular ejection fraction, by estimation, is 55 to 60%. The left ventricle has normal function. The left ventricle has no regional wall motion abnormalities. Left ventricular diastolic parameters were normal. Global longitudinal strain does not track completely and is not reported. 2. Right ventricular systolic function is normal. The right ventricular size is normal. There is normal pulmonary artery systolic pressure. The estimated right ventricular systolic pressure is 29.8 mmHg. 3. The mitral valve is grossly normal. Trivial mitral valve regurgitation. 4. The aortic valve is tricuspid. Aortic valve regurgitation is not visualized. Mild to moderate aortic valve sclerosis/calcification is present, without any evidence of aortic stenosis. 5. The inferior vena cava is normal in size with greater than 50% respiratory variability, suggesting right atrial pressure of 3 mmHg. Comparison(s): Echocardiogram done 10/28/19 showed an Ef of 40-45%.  Echocardiogram 10/28/2019  1. Left ventricular ejection fraction, by estimation, is 40 to 45%. The left ventricle has mildly decreased function. The left ventricle demonstrates global hypokinesis. There is moderate left ventricular hypertrophy. Left ventricular diastolic parameters are consistent with Grade I diastolic dysfunction (impaired relaxation). 2. Right ventricular  systolic function is moderately reduced. The right ventricular size is mildly enlarged. There is normal pulmonary artery systolic pressure. 3. The mitral valve is normal in structure. Trivial mitral valve regurgitation. No evidence of mitral stenosis. 4. The aortic valve is normal in structure. Aortic valve regurgitation is not visualized. No aortic stenosis is present. 5. The inferior vena cava is normal in size with greater than 50% respiratory variability, suggesting right atrial pressure of 3 mmHg.  Assessment and Plan:  1. Cardiomyopathy, unspecified type (HCC)   2. Essential hypertension, benign   3. Suspected sleep apnea     1. Cardiomyopathy, unspecified type [facility]) Recent echocardiogram during hospitalization for septic shock.  EF of 40 to 45%, global hypokinesis, LVH, G1 DD. trivial MR.  Recent repeat echo 01/25/2020 showed EF returned back to normal at 55 to  60%.  Trivial mitral regurgitation. Continue carvedilol 3.125 mg p.o. twice daily.  Continue losartan 50 mg daily.   2. Essential hypertension, benign Blood pressure 142/84.  Increase amlodipine to 10 mg daily.  Continue losartan to 50mg  daily.(PCP just increased losartan to 50 mg) about a month ago.  Continue carvedilol 3.125 mg p.o. twice daily.  Check blood pressures daily and bring with follow-up appointment.   3. Suspected sleep apnea Recent consultation with Dr. neurology.  Had polysomnogram which found mild OSA with an AHI of 7.7/h and REM AHI of 42.2/h.  Mild snoring, severe periodic limb movement disorder.  And it was clearly REM sleep and sleep time sleep dependent.  CPAP titration study was recommended to optimize therapy.  Plan B was auto titration CPAP device.  Nasal pillows or nasal mask 5 to 15 cm of water.  3 cm EPR.  Short ramp, heated humidity.  Ordered to avoid supine sleep.    Medication Adjustments/Labs and Tests Ordered: Current medicines are reviewed at length with the patient today.   Concerns regarding medicines are outlined above.    Disposition: Follow-up with Dr. Vickey Huger or APP 1 month  Signed, Wyline Mood, NP 11/14/2019 1:07 PM    [facility] [address] [place], [place], [place] Phone: [phone]; Fax: [phone]

## 2020-04-18 ENCOUNTER — Ambulatory Visit: Payer: BC Managed Care – PPO | Admitting: Family Medicine

## 2020-04-18 ENCOUNTER — Telehealth: Payer: Self-pay | Admitting: Family Medicine

## 2020-04-18 ENCOUNTER — Encounter: Payer: Self-pay | Admitting: Family Medicine

## 2020-04-18 VITALS — BP 142/84 | HR 97 | Ht 73.0 in | Wt 255.2 lb

## 2020-04-18 DIAGNOSIS — Z79899 Other long term (current) drug therapy: Secondary | ICD-10-CM

## 2020-04-18 DIAGNOSIS — I10 Essential (primary) hypertension: Secondary | ICD-10-CM | POA: Diagnosis not present

## 2020-04-18 MED ORDER — LOSARTAN POTASSIUM 50 MG PO TABS: 50.0000 mg | ORAL_TABLET | Freq: Every day | ORAL | 1 refills | Status: DC

## 2020-04-18 MED ORDER — AMLODIPINE BESYLATE 10 MG PO TABS: 10.0000 mg | ORAL_TABLET | Freq: Every day | ORAL | 1 refills | Status: DC

## 2020-04-18 NOTE — Patient Instructions (Addendum)
Medication Instructions:   Your physician has recommended you make the following change in your medication:   Increase amlodipine to 10 mg by mouth daily  Continue other medications the same  Labwork:  Your physician recommends that you return for non-fasting lab work in: 2 weeks to check your BMET & Mg levels. This may be done at [facility].   Testing/Procedures:  none  Follow-Up:  Your physician recommends that you schedule a follow-up appointment in: 1 month.  Any Other Special Instructions Will Be Listed Below (If Applicable).  If you need a refill on your cardiac medications before your next appointment, please call your pharmacy.

## 2020-04-19 NOTE — Telephone Encounter (Signed)
Have pt f/u anxiety in next 30-60 days. Thx. Dr. Ladona Ridgel

## 2020-04-22 NOTE — Telephone Encounter (Signed)
Please call patient to have him set up appt. Thank you 

## 2020-04-22 NOTE — Telephone Encounter (Signed)
Sent my chart to schedule appointment  

## 2020-04-25 NOTE — Telephone Encounter (Signed)
Called to schedule appointment no answer and sent my chart message

## 2020-05-09 ENCOUNTER — Ambulatory Visit (INDEPENDENT_AMBULATORY_CARE_PROVIDER_SITE_OTHER): Payer: BC Managed Care – PPO | Admitting: Gastroenterology

## 2020-05-13 DIAGNOSIS — J3089 Other allergic rhinitis: Secondary | ICD-10-CM | POA: Diagnosis not present

## 2020-05-13 NOTE — Progress Notes (Signed)
VIALS EXP 05-13-21 

## 2020-05-16 ENCOUNTER — Ambulatory Visit: Payer: BC Managed Care – PPO | Admitting: Family Medicine

## 2020-05-23 ENCOUNTER — Ambulatory Visit: Payer: BC Managed Care – PPO | Admitting: Family Medicine

## 2020-05-24 ENCOUNTER — Ambulatory Visit: Payer: BC Managed Care – PPO | Admitting: Family Medicine

## 2020-06-19 ENCOUNTER — Other Ambulatory Visit: Payer: Self-pay | Admitting: Family Medicine

## 2020-07-03 ENCOUNTER — Telehealth: Payer: Self-pay

## 2020-07-03 NOTE — Telephone Encounter (Signed)
Trevor Rollins had a sleep study several months ago and never heard anything back. His wife was in couple of weeks ago and said they looked information up then recommending a C-pap machine?   Call back [phone]

## 2020-07-03 NOTE — Telephone Encounter (Signed)
Have the doctor that ordered it discuss with pt and they can order the machine.   Thanks,   Dr. Ladona Ridgel

## 2020-07-03 NOTE — Telephone Encounter (Signed)
Patient notified and will contact cardiology(ordering provider) to discuss results and treatment

## 2020-07-15 ENCOUNTER — Other Ambulatory Visit: Payer: Self-pay | Admitting: Family Medicine

## 2020-07-17 ENCOUNTER — Telehealth: Payer: Self-pay

## 2020-07-17 MED ORDER — METHOCARBAMOL 750 MG PO TABS: 750.0000 mg | ORAL_TABLET | Freq: Three times a day (TID) | ORAL | 0 refills | Status: DC

## 2020-07-17 NOTE — Telephone Encounter (Signed)
Patient has an appt in 2 weeks for med check but is hoping to get a refill on his methocarbamol 500mg . before then.  He is having muscle spasms.  [facility].

## 2020-07-17 NOTE — Telephone Encounter (Signed)
Pt contacted and verbalized understanding.  

## 2020-07-17 NOTE — Telephone Encounter (Signed)
Please advise. Thank you

## 2020-07-17 NOTE — Telephone Encounter (Signed)
sent 

## 2020-07-21 NOTE — Progress Notes (Signed)
Cardiology Office Note  Date: 11/14/2019   ID: Trevor Rollins, DOB 1970-09-14, MRN [id number]  PCP:  Trevor Genta, DO  Cardiologist:  Trevor Rich, MD Electrophysiologist:  None   Chief Complaint: BP elevation.  Recently started on losartan per PCP request  History of Present Illness: Trevor Rollins is a 50 y.o. male with a history of HTN, HLD, obesity, asthma,  At last visit  he was being referred back by PCP for increased blood pressure.  Losartan had been increased to 50 mg by PCP.  He denied any other issues other than back problems from disc degeneration.  No anginal or exertional symptoms, palpitations or arrhythmias, orthostatic symptoms, CVA or TIA-like symptoms, PND, orthopnea, bleeding, DVT or PE-like symptoms, lower extremity edema.  BP t was 142/84.  He stated he would forget to take the evening dose of carvedilol and sometimes doubled up on the a.m. dose due to forgetting the nighttime dosage.  He is here for follow-up today.  Blood pressure has improved some.Marland Kitchen  He states some days his blood pressure looks better than others.  BP today 130/90.  He denies any anginal or exertional symptoms.  Denies any orthostatic symptoms, CVA or TIA-like symptoms, palpitations or arrhythmias, PND, orthopnea.  He recently had a sleep study with Dr. Oliva Rollins office.  He states he was never contacted by Dr. Oliva Rollins office regarding the results nor any follow-up instructions.  The sleep study found mild obstructive sleep apnea with an AHI of 7.7/h and REM AHI of 42.2/h.  Mild snoring, severe periodic limb movement disorder.  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  No prolonged hypoxemia was noted.  A CPAP titration study was recommended to optimize therapy.  Plan B was auto titration CPAP device, nasal pillows or nasal mask, 5-15 cmH2O, 3 cm EPR, short ramp, heated humidity.  Recommended patient avoid supine sleep.  Dental device or inspire implant will not resolve the REM  sleep dependent form of apnea.  Patient reports waking up approximately 20 times last night.  States he needs some definitive treatment.  Past Medical History:  Diagnosis Date   Allergy    Anxiety    Asthma    DDD (degenerative disc disease)    pt denies   Deafness in left ear    ED (erectile dysfunction)    Hyperlipidemia    Hypertension    Hypertriglyceridemia    Insomnia     Past Surgical History:  Procedure Laterality Date   ORIF ANKLE FRACTURE Right 03/01/2014   Procedure: OPEN REDUCTION INTERNAL FIXATION (ORIF) RIGHT ANKLE FRACTURE;  Surgeon: Trevor Almas, MD;  Location: [facility] OR;  Service: Orthopedics;  Laterality: Right;   SPINAL CORD STIMULATOR BATTERY EXCHANGE N/A 12/24/2016   Procedure: Replacement of implantable pulse generator,;  Surgeon: Trevor Fraction, MD;  Location: [facility] OR;  Service: Neurosurgery;  Laterality: N/A;  Replacement of implantable pulse generator, possible replacement of leads on spinal cord stimulator   SPINAL CORD STIMULATOR IMPLANT     TONSILLECTOMY     WISDOM TOOTH EXTRACTION      Current Outpatient Medications  Medication Sig Dispense Refill   albuterol (VENTOLIN HFA) 108 (90 Base) MCG/ACT inhaler INHALE 4 PUFFS INTO THE LUNGS EVERY 4-6 HOURS AS NEEDED FOR UP TO 30 DAYS. 18 g 0   amLODipine (NORVASC) 5 MG tablet Take 1 tablet (5 mg total) by mouth daily. 90 tablet 1   citalopram (CELEXA) 20 MG tablet Take 1 tablet (20  mg total) by mouth daily. 90 tablet 1   diphenhydrAMINE (BENADRYL ALLERGY) 25 MG tablet Take 25 mg by mouth every 6 (six) hours as needed for allergies.      FLOVENT HFA 110 MCG/ACT inhaler INHALE 2 PUFFS INTO THE LUNGS 2 TIMES DAILY. 12 g 0   fluticasone (FLONASE) 50 MCG/ACT nasal spray Place 2 sprays into both nostrils daily. (Patient taking differently: Place 2 sprays into both nostrils daily as needed for allergies or rhinitis. ) 16 g 5   gabapentin (NEURONTIN) 300 MG capsule Take 300 mg by mouth 3 (three) times daily.      loratadine (CLARITIN) 10 MG tablet Take 10 mg by mouth daily.     methocarbamol (ROBAXIN) 500 MG tablet Take 1 tablet (500 mg total) by mouth every 6 (six) hours as needed for muscle spasms. 60 tablet 2   pantoprazole (PROTONIX) 40 MG tablet Take 1 tablet (40 mg total) by mouth at bedtime. 30 tablet 0   aspirin 81 MG chewable tablet Chew 1 tablet (81 mg total) by mouth daily. (Patient not taking: Reported on 11/10/2019)     carvedilol (COREG) 3.125 MG tablet Take 1 tablet (3.125 mg total) by mouth 2 (two) times daily. 60 tablet 6   losartan (COZAAR) 25 MG tablet Take 0.5 tablets (12.5 mg total) by mouth daily. 15 tablet 6   No current facility-administered medications for this visit.   Allergies:  Penicillins   Social History: The patient  reports that he has never smoked. He has never used smokeless tobacco. He reports current alcohol use. He reports that he does not use drugs.   Family History: The patient's family history includes Cancer in his sister; Stroke in an other family member.   ROS:  Please see the history of present illness. Otherwise, complete review of systems is positive for none.  All other systems are reviewed and negative.   Physical Exam: VS:  BP 138/80   Pulse 90   Ht 6' 1.5" (1.867 m)   Wt 239 lb 6.4 oz (108.6 kg)   SpO2 97%   BMI 31.16 kg/m , BMI Body mass index is 31.16 kg/m.  Wt Readings from Last 3 Encounters:  11/14/19 239 lb 6.4 oz (108.6 kg)  11/10/19 242 lb (109.8 kg)  11/01/19 256 lb 6.3 oz (116.3 kg)     General: Patient appears comfortable at rest. Neck: Supple, no elevated JVP or carotid bruits, no thyromegaly. Lungs: Clear to auscultation, nonlabored breathing at rest. Cardiac: Regular rate and rhythm, no S3 or significant systolic murmur, no pericardial rub. Extremities: No pitting edema, distal pulses 2+. Skin: Warm and dry. Musculoskeletal: No kyphosis. Neuropsychiatric: Alert and oriented x3, affect grossly appropriate   ECG:  EKG on  October 29, 2019 showed normal sinus rhythm rate of 77.  Recent Labwork: 10/28/2019: B Natriuretic Peptide 568.1 10/30/2019: ALT 44; AST 34 11/01/2019: BUN 26; Creatinine, Ser 1.53; Hemoglobin 11.8; Magnesium 1.8; Platelets 209; Potassium 3.9; Sodium 142     Component Value Date/Time   CHOL 199 08/19/2012 1401   TRIG 211 (H) 08/19/2012 1401   HDL 48 08/19/2012 1401   CHOLHDL 4.1 08/19/2012 1401   VLDL 42 (H) 08/19/2012 1401   LDLCALC 109 (H) 08/19/2012 1401    Other Studies Reviewed Today:   Echocardiogram 01/25/2020 1. Left ventricular ejection Rollins, by estimation, is 55 to 60%. The left ventricle has normal function. The left ventricle has no regional wall motion abnormalities. Left ventricular diastolic parameters were normal. Global longitudinal  strain does not track completely and is not reported. 2. Right ventricular systolic function is normal. The right ventricular size is normal. There is normal pulmonary artery systolic pressure. The estimated right ventricular systolic pressure is 29.8 mmHg. 3. The mitral valve is grossly normal. Trivial mitral valve regurgitation. 4. The aortic valve is tricuspid. Aortic valve regurgitation is not visualized. Mild to moderate aortic valve sclerosis/calcification is present, without any evidence of aortic stenosis. 5. The inferior vena cava is normal in size with greater than 50% respiratory variability, suggesting right atrial pressure of 3 mmHg. Comparison(s): Echocardiogram done 10/28/19 showed an Ef of 40-45%.  Echocardiogram 10/28/2019  1. Left ventricular ejection Rollins, by estimation, is 40 to 45%. The left ventricle has mildly decreased function. The left ventricle demonstrates global hypokinesis. There is moderate left ventricular hypertrophy. Left ventricular diastolic parameters are consistent with Grade I diastolic dysfunction (impaired relaxation). 2. Right ventricular systolic function is moderately reduced. The  right ventricular size is mildly enlarged. There is normal pulmonary artery systolic pressure. 3. The mitral valve is normal in structure. Trivial mitral valve regurgitation. No evidence of mitral stenosis. 4. The aortic valve is normal in structure. Aortic valve regurgitation is not visualized. No aortic stenosis is present. 5. The inferior vena cava is normal in size with greater than 50% respiratory variability, suggesting right atrial pressure of 3 mmHg.  Assessment and Plan:  1. Cardiomyopathy, unspecified type (HCC)   2. Essential hypertension, benign   3. Suspected sleep apnea     1. Cardiomyopathy, unspecified type [facility]) Recent echocardiogram during hospitalization for septic shock.  EF of 40 to 45%, global hypokinesis, LVH, G1 DD. trivial MR.  Recent repeat echo 01/25/2020 showed EF returned back to normal at 55 to 60%.  Trivial mitral regurgitation. Continue carvedilol 3.125 mg p.o. twice daily.  Continue losartan 50 mg daily.   2. Essential hypertension, benign Blood pressures have improved some since last visit.  Today's blood pressure 130/90.  Continue amlodipine to 10 mg daily.  Continue losartan to 50mg  daily.(PCP just increased losartan to 50 mg) about a month ago.  Continue carvedilol 3.125 mg p.o. twice daily.    3. Suspected sleep apnea Recent consultation with Dr. neurology.  Had polysomnogram which found mild OSA with an AHI of 7.7/h and REM AHI of 42.2/h.  Mild snoring, severe periodic limb movement disorder.  It was clearly REM sleep and sleep time sleep dependent.  CPAP titration study was recommended to optimize therapy.  Plan B was auto titration CPAP device.  Nasal pillows or nasal mask 5 to 15 cm of water.  3 cm EPR.  Short ramp, heated humidity.  Ordered to avoid supine sleep.  He arrives today stating he has not heard anything regarding his sleep study.  He states no one called him from Dr. Vickey Huger office regarding sleep study results nor any  follow-up.  I have instructed nursing staff to call for future recommendations and follow-up.  Spoke to Dr. Oliva Rollins via secure chat today.  She states she will have office staff follow-up with patient.  4.  Hyperlipidemia Please get a follow-up lipid panel.  He has not had any recent lipid levels in epic since 2014 when his LDL was 109.  Currently not on statin medications.  Please get lipid panel.  Medication Adjustments/Labs and Tests Ordered: Current medicines are reviewed at length with the patient today.  Concerns regarding medicines are outlined above.    Disposition: Follow-up with Dr. 2015 or APP 6 months  Signed, Rennis Harding, NP 11/14/2019 1:07 PM    [facility] [address] [place], [place], [place] [address] Phone: [phone]; Fax: [phone]

## 2020-07-22 ENCOUNTER — Telehealth: Payer: Self-pay | Admitting: Neurology

## 2020-07-22 ENCOUNTER — Other Ambulatory Visit: Payer: Self-pay | Admitting: Neurology

## 2020-07-22 ENCOUNTER — Encounter: Payer: Self-pay | Admitting: *Deleted

## 2020-07-22 ENCOUNTER — Other Ambulatory Visit: Payer: Self-pay | Admitting: *Deleted

## 2020-07-22 ENCOUNTER — Encounter: Payer: Self-pay | Admitting: Family Medicine

## 2020-07-22 ENCOUNTER — Encounter: Payer: Self-pay | Admitting: Neurology

## 2020-07-22 ENCOUNTER — Ambulatory Visit: Payer: BC Managed Care – PPO | Admitting: Family Medicine

## 2020-07-22 VITALS — BP 130/90 | HR 100 | Ht 73.0 in | Wt 254.6 lb

## 2020-07-22 DIAGNOSIS — I509 Heart failure, unspecified: Secondary | ICD-10-CM

## 2020-07-22 DIAGNOSIS — E782 Mixed hyperlipidemia: Secondary | ICD-10-CM

## 2020-07-22 DIAGNOSIS — G4733 Obstructive sleep apnea (adult) (pediatric): Secondary | ICD-10-CM

## 2020-07-22 DIAGNOSIS — R579 Shock, unspecified: Secondary | ICD-10-CM

## 2020-07-22 DIAGNOSIS — R0683 Snoring: Secondary | ICD-10-CM

## 2020-07-22 DIAGNOSIS — R0681 Apnea, not elsewhere classified: Secondary | ICD-10-CM

## 2020-07-22 DIAGNOSIS — I10 Essential (primary) hypertension: Secondary | ICD-10-CM

## 2020-07-22 DIAGNOSIS — G4700 Insomnia, unspecified: Secondary | ICD-10-CM | POA: Insufficient documentation

## 2020-07-22 DIAGNOSIS — R29818 Other symptoms and signs involving the nervous system: Secondary | ICD-10-CM | POA: Diagnosis not present

## 2020-07-22 DIAGNOSIS — A419 Sepsis, unspecified organism: Secondary | ICD-10-CM

## 2020-07-22 DIAGNOSIS — I429 Cardiomyopathy, unspecified: Secondary | ICD-10-CM | POA: Diagnosis not present

## 2020-07-22 DIAGNOSIS — J683 Other acute and subacute respiratory conditions due to chemicals, gases, fumes and vapors: Secondary | ICD-10-CM

## 2020-07-22 NOTE — Telephone Encounter (Signed)
I called pt. I advised pt that Dr. Vickey Huger reviewed their sleep study results and found that pt has mild sleep apnea. Dr. Vickey Huger recommends that pt starts auto CPAP. I reviewed PAP compliance expectations with the pt. Pt is agreeable to starting a CPAP. I advised pt that an order will be sent to a DME, [facility] , and [facility]  will call the pt within about one week after they file with the pt's insurance. [facility])  will show the pt how to use the machine, fit for masks, and troubleshoot the CPAP if needed. A follow up appt will need to be made for insurance purposes with Dr. Vickey Huger within 31-90 days from the date he is set up with the machine. Pt verbalized understanding to arrive 15 minutes early and bring their CPAP. A letter with all of this information in it will be mailed to the pt as a reminder. I verified with the pt that the address we have on file is correct. Pt verbalized understanding of results. Pt had no questions at this time but was encouraged to call back if questions arise. I have sent the order to [facility]) and have received confirmation that they have received the order.

## 2020-07-22 NOTE — Patient Instructions (Addendum)
Medication Instructions:  Your physician recommends that you continue on your current medications as directed. Please refer to the Current Medication list given to you today.  Labwork: Your physician recommends that you return for a FASTING lipid profile: as soon as possible this week. Please do not eat or drink for at least 8 hours when you have this done. You may take your medications that morning with a sip of water. This can be done at [employer] [employer].  Testing/Procedures: none  Follow-Up: Your physician recommends that you schedule a follow-up appointment in: 6 months.  Any Other Special Instructions Will Be Listed Below (If Applicable). Please contact [facility] about your CPAP titration-[phone] Melvyn Novas, MD 03/07/2020 3:12 PM [facility]   If you need a refill on your cardiac medications before your next appointment, please call your pharmacy.

## 2020-07-22 NOTE — Addendum Note (Signed)
Addended by: Judi Cong on: 07/22/2020 03:52 PM   Modules accepted: Orders

## 2020-07-22 NOTE — Telephone Encounter (Signed)
Details  Narrative Trevor Rollins, Trevor Mylar, MD     04/17/2020 12:53 PM  PATIENT'S NAME:  Trevor Rollins, Trevor Rollins  DOB:      1970/10/18      MR#:    [id number]      DATE OF RECORDING: 04/05/2020  REFERRING M.D.:  Donette Larry, MD and Ramon Dredge., NP  Study Performed:   Baseline Polysomnogram  HISTORY:  Trevor Rollins is a 50 - year- old Caucasian male patient  is seen upon a referral from [facility].  He wakes up  frequently during the night, he used to could just sleep 6 to 7  hours and would be okay but finds now that he averages about 10  hours of sleep and during that time he wakes up frequently and  wakes still feeling tired. His wife has told him that he snores  and she has witnessed apnea events. He has an implanted spinal  stimulator for pain, Dr Ollen Bowl.   Chief concern :  The patient was hospitalized at [facility], Mr. [place]  was admitted with septic shock he had been presenting on 28 October 2019 finally to the hospital following 3 days of  nausea, vomiting diarrhea and severe dehydration intense chills,  was first seen in urgent care with a blood pressure of only 85/68  mmHg. Covid test initially negative, chest x-ray showed  cardiomegaly without edema and left lower lobe atelectasis was  noted.  He was placed on pressors and stress dose steroids along  with fluids IV resuscitation. Echocardiogram demonstrated an EF  of 40% with global hypokinesis. Stress induced cardiomyopathy?  In the emergency room he was tachypneic his oxygen pressures were  very low and he had lactic acidosis.  His creatinine had risen  201.47 his AST to 89, his ALT to 61.  Initial troponin of 208.     The patient endorsed the Epworth Sleepiness Scale at 10/24  points.  FSS at  41/63- high.  The patient's weight 246 pounds with a height of 74 (inches),  resulting in a BMI of 32. kg/m2.  The patient's neck circumference measured 17.8 inches.   CURRENT MEDICATIONS: Ventolin, Norvasc, Celexa, Valium, Benadryl,   Flonase, Flovent, Neurontin, Cozaar, Robaxin, Coreg     PROCEDURE:  This is a multichannel digital polysomnogram  utilizing the Somnostar 11.2 system.  Electrodes and sensors were  applied and monitored per AASM Specifications.   EEG, EOG, Chin  and Limb EMG, were sampled at 200 Hz.  ECG, Snore and Nasal  Pressure, Thermal Airflow, Respiratory Effort, CPAP Flow and  Pressure, Oximetry was sampled at 50 Hz. Digital video and audio  were recorded.       BASELINE STUDY: Lights Out was at 21:55 and Lights On at 04:47.    Total recording time (TRT) was 413 minutes, with a total sleep  time (TST) of 343.5 minutes.   The patient's sleep latency was  46.5 minutes.  REM latency was 244 minutes.  The sleep efficiency  was 83.2 %.      SLEEP ARCHITECTURE: WASO (Wake after sleep onset) was 44 minutes.   There were 8 minutes in Stage N1, 263.5 minutes Stage N2, 45  minutes Stage N3 and 27 minutes in Stage REM.  The percentage of  Stage N1 was 2.3%, Stage N2 was 76.7%, Stage N3 was 13.1% and  Stage R (REM sleep) was 7.9%.   RESPIRATORY ANALYSIS:  There were a total of 44 respiratory  events:  29 obstructive apneas, 0 central apneas and  2 mixed  apneas with 13 hypopneas.      The total APNEA/HYPOPNEA INDEX (AHI) was 7.7/hour. 19 events  occurred in REM sleep and 23 events in NREM. The REM AHI was 42.2  /hour, versus a non-REM AHI of 4.7.  The patient spent 166.5 minutes of total sleep time in the supine  position and 177 minutes in non-supine. The supine AHI was 14.1/h  versus a non-supine AHI of 1.7.   OXYGEN SATURATION & C02:  The Wake baseline 02 saturation was  97%, with the lowest being 52%. Time spent below 89% saturation  equaled 3 minutes.  The arousals were noted as: 54 were spontaneous, 28 were  associated with PLMs, 14 were associated with respiratory events.  The patient had a total of 154 Periodic Limb Movements. The  Periodic Limb Movement (PLM) Arousal index was 4.9/hour.   Audio and video analysis did not show any abnormal or unusual  movements, behaviors, phonations or vocalizations.  The patient  took bathroom breaks. Mild Snoring was noted.  EKG was in keeping with normal sinus rhythm (NSR).   IMPRESSION:   1. This PSG found mild Obstructive Sleep Apnea (OSA) at an AHI of  7.7/h and REM AHI of 42.2/h., mild snoring, and severe Periodic  Limb Movement Disorder (PLMD).  2. 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  3. No prolonged hypoxemia was noted.   RECOMMENDATIONS:   1. CPAP titration study is recommended to optimize therapy.    2. Plan B is autotitration CPAP device, nasal pillow or nasal  mask, 5-15 cm water, 3 cm EPR , short RAMP, heated humidity.  3. The patient needs to avoid supine sleep.  4. Dental device or inspire implant will not resolve a REM sleep  dependent forms of apnea.    I certify that I have reviewed the entire raw data recording  prior to the issuance of this report in accordance with the  Standards of Accreditation of the American Academy of Sleep  Medicine (AASM)    Melvyn Novas, MD  Diplomat, American Board of Neurology  Diplomat, American Board of Sleep Medicine  Wellsite geologist, [facility], [employer] [facility] [employer] [facility]         Last Resulted: 04/05/20 20:00

## 2020-08-01 ENCOUNTER — Other Ambulatory Visit: Payer: Self-pay

## 2020-08-01 ENCOUNTER — Ambulatory Visit (INDEPENDENT_AMBULATORY_CARE_PROVIDER_SITE_OTHER): Payer: BC Managed Care – PPO | Admitting: Family Medicine

## 2020-08-01 ENCOUNTER — Encounter: Payer: Self-pay | Admitting: Family Medicine

## 2020-08-01 VITALS — BP 138/84 | HR 76 | Temp 97.3°F | Wt 253.4 lb

## 2020-08-01 DIAGNOSIS — I509 Heart failure, unspecified: Secondary | ICD-10-CM

## 2020-08-01 DIAGNOSIS — I429 Cardiomyopathy, unspecified: Secondary | ICD-10-CM

## 2020-08-01 DIAGNOSIS — F411 Generalized anxiety disorder: Secondary | ICD-10-CM | POA: Diagnosis not present

## 2020-08-01 DIAGNOSIS — M5416 Radiculopathy, lumbar region: Secondary | ICD-10-CM | POA: Diagnosis not present

## 2020-08-01 DIAGNOSIS — I10 Essential (primary) hypertension: Secondary | ICD-10-CM | POA: Diagnosis not present

## 2020-08-01 MED ORDER — METHOCARBAMOL 750 MG PO TABS: 750.0000 mg | ORAL_TABLET | Freq: Three times a day (TID) | ORAL | 5 refills | Status: DC

## 2020-08-01 MED ORDER — CITALOPRAM HYDROBROMIDE 20 MG PO TABS: 20.0000 mg | ORAL_TABLET | Freq: Every day | ORAL | 1 refills | Status: DC

## 2020-08-01 MED ORDER — DIAZEPAM 5 MG PO TABS: 5.0000 mg | ORAL_TABLET | Freq: Two times a day (BID) | ORAL | 2 refills | Status: DC | PRN

## 2020-08-01 NOTE — Progress Notes (Signed)
Pt here for medication refills. Pt is down to about one month on all meds. Pt would like refill on Diazepam 5 mg ; uses this when he goes out of the country. Pt going to [place] and [place] for 2 weeks

## 2020-08-01 NOTE — Progress Notes (Signed)
Patient ID: Trevor Rollins, male    DOB: April 18, 1970, 50 y.o.   MRN: [id number]   Chief Complaint  Patient presents with   Hypertension   Subjective:    HPI Cc- f/u htn  Pt here for medication refills. Pt is down to about one month on all meds. Pt would like refill on Diazepam 5 mg ; uses this when he goes out of the country. Pt going to [place] and [place] for 2 weeks.  Pt had some right back pain with h/o chronic lumbar radiculopathy and RSD.  Had some shots in back from surgeon in [place], [place]. Went to another Psychologist, sport and exercise and did PT for 3 months and improved a lot. Better with his walking.  Pt had cpap study and has some apnea. Has cpap machine this week.   Was drowsy and sleepy while driving. Sleeping better ater 7.5 hrs.  Will help with bp and heart.  Waking up a lot at night.   Seeing NP at cards office.  Not on gabapentin now.  Wanting refill on valium, pt stating taking this for back spasms and anxiety.  Stopped using it for while.  Using robaxin for back and leg discomfort from chronic back pain from surgeries.  HTN Pt compliant with BP meds.  No SEs Denies chest pain, sob, LE swelling, or blurry vision.   Medical History Trevor Rollins has a past medical history of Allergy, Anxiety, Asthma, DDD (degenerative disc disease), Deafness in left ear, ED (erectile dysfunction), Hyperlipidemia, Hypertension, Hypertriglyceridemia, and Insomnia.   Outpatient Encounter Medications as of 08/01/2020  Medication Sig   albuterol (VENTOLIN HFA) 108 (90 Base) MCG/ACT inhaler INHALE 4 PUFFS INTO THE LUNGS EVERY 4-6 HOURS AS NEEDED FOR UP TO 30 DAYS.   amLODipine (NORVASC) 10 MG tablet Take 10 mg by mouth daily.   carvedilol (COREG) 3.125 MG tablet TAKE (1) TABLET TWICE DAILY.   diphenhydrAMINE (BENADRYL) 25 MG tablet Take 25 mg by mouth every 6 (six) hours as needed for allergies.    fluticasone (FLOVENT HFA) 110 MCG/ACT inhaler Inhale 2 puffs into the lungs in the morning and at bedtime.    losartan (COZAAR) 50 MG tablet Take 1 tablet (50 mg total) by mouth daily.   [DISCONTINUED] citalopram (CELEXA) 20 MG tablet TAKE 1 TABLET BY MOUTH ONCE DAILY.   [DISCONTINUED] diazepam (VALIUM) 5 MG tablet Take 1 tablet (5 mg total) by mouth every 12 (twelve) hours as needed for anxiety or muscle spasms.   [DISCONTINUED] fluticasone (FLONASE) 50 MCG/ACT nasal spray Place 2 sprays into both nostrils daily. (Patient taking differently: Place 2 sprays into both nostrils daily as needed for allergies or rhinitis.)   [DISCONTINUED] gabapentin (NEURONTIN) 300 MG capsule Take 300 mg by mouth 3 (three) times daily.   [DISCONTINUED] methocarbamol (ROBAXIN) 750 MG tablet Take 1 tablet (750 mg total) by mouth 3 (three) times daily.   citalopram (CELEXA) 20 MG tablet Take 1 tablet (20 mg total) by mouth daily.   diazepam (VALIUM) 5 MG tablet Take 1 tablet (5 mg total) by mouth every 12 (twelve) hours as needed for anxiety or muscle spasms.   methocarbamol (ROBAXIN) 750 MG tablet Take 1 tablet (750 mg total) by mouth 3 (three) times daily.   No facility-administered encounter medications on file as of 08/01/2020.     Review of Systems  Constitutional:  Negative for chills and fever.  HENT:  Negative for congestion, rhinorrhea and sore throat.   Respiratory:  Negative for cough, shortness of breath and  wheezing.   Cardiovascular:  Negative for chest pain and leg swelling.  Gastrointestinal:  Negative for abdominal pain, diarrhea, nausea and vomiting.  Genitourinary:  Negative for dysuria and frequency.  Musculoskeletal:  Positive for back pain (chronic).  Skin:  Negative for rash.  Neurological:  Negative for dizziness, weakness and headaches.    Vitals BP 138/84   Pulse 76   Temp (!) 97.3 F (36.3 C)   Wt 253 lb 6.4 oz (114.9 kg)   SpO2 97%   BMI 33.43 kg/m   Objective:   Physical Exam Vitals and nursing note reviewed.  Constitutional:      General: He is not in acute distress.     Appearance: Normal appearance. He is not ill-appearing.  HENT:     Head: Normocephalic.     Nose: Nose normal. No congestion.     Mouth/Throat:     Mouth: Mucous membranes are moist.     Pharynx: No oropharyngeal exudate.  Eyes:     Extraocular Movements: Extraocular movements intact.     Conjunctiva/sclera: Conjunctivae normal.     Pupils: Pupils are equal, round, and reactive to light.  Cardiovascular:     Rate and Rhythm: Normal rate and regular rhythm.     Pulses: Normal pulses.     Heart sounds: Normal heart sounds. No murmur heard. Pulmonary:     Effort: Pulmonary effort is normal.     Breath sounds: Normal breath sounds. No wheezing, rhonchi or rales.  Musculoskeletal:        General: Normal range of motion.     Right lower leg: No edema.     Left lower leg: No edema.  Skin:    General: Skin is warm and dry.     Findings: No rash.  Neurological:     General: No focal deficit present.     Mental Status: He is alert and oriented to person, place, and time.     Cranial Nerves: No cranial nerve deficit.  Psychiatric:        Mood and Affect: Mood normal.        Behavior: Behavior normal.        Thought Content: Thought content normal.        Judgment: Judgment normal.     Assessment and Plan   1. Congestive heart failure with cardiomyopathy (HCC) - CMP14+EGFR - Lipid panel - CBC  2. Generalized anxiety disorder  3. Lumbar radiculopathy, chronic  4. Primary hypertension   Will order labs to recheck, since last set labs in 9/21 when was in [place]. Pt to get labs will call with results   GAD- pt taking valium prn.taking celexa daily.  Stable.  Lumbar radiculopathy and rsd- f/u with ortho and cont with robaxin prn.   Htn- stable. Cont with norvasc, coreg and losartan.   Return in about 6 months (around 01/31/2021).

## 2020-08-19 ENCOUNTER — Telehealth: Payer: Self-pay | Admitting: Family Medicine

## 2020-08-20 NOTE — Telephone Encounter (Signed)
Pls call pt to get labs to f/u on kidney functions since hasn't had labs since in hospital for septic shock.   Thanks,   Dr. Ladona Ridgel
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
## 2020-08-20 NOTE — Telephone Encounter (Signed)
Left message to return call 

## 2020-10-03 ENCOUNTER — Encounter: Payer: Self-pay | Admitting: Neurology

## 2020-10-03 ENCOUNTER — Ambulatory Visit: Payer: BC Managed Care – PPO | Admitting: Neurology

## 2020-10-03 VITALS — BP 132/81 | HR 98 | Ht 73.0 in | Wt 259.5 lb

## 2020-10-03 DIAGNOSIS — J683 Other acute and subacute respiratory conditions due to chemicals, gases, fumes and vapors: Secondary | ICD-10-CM | POA: Diagnosis not present

## 2020-10-03 DIAGNOSIS — G4733 Obstructive sleep apnea (adult) (pediatric): Secondary | ICD-10-CM | POA: Diagnosis not present

## 2020-10-03 NOTE — Addendum Note (Signed)
Addended by: Melvyn Novas on: 10/03/2020 02:11 PM   Modules accepted: Orders

## 2020-10-03 NOTE — Patient Instructions (Signed)

## 2020-10-03 NOTE — Progress Notes (Signed)
SLEEP MEDICINE CLINIC    Provider:  Melvyn Novas, MD  Primary Care Physician:  Annalee Genta, DO [address] [place] [address]     Referring Provider: [facility] , Dr Wyline Mood.         Chief Complaint according to patient   Patient presents with:     New Patient (Initial Visit)     Pt alone, rm 11 patient presents today referred from cardiology, wakes up frequently during the night. Patient used to could just sleep 6 to 7 hours and would be okay but finds now that he averages about 10 hours of sleep. During that time he wakes up frequently. And when he wakes up to start the day still feels tired. Never had a sleep study. Wife has told him that he snores and she has witnessed apnea events.      HISTORY OF PRESENT ILLNESS:  Trevor Rollins is a 50 - year- old Caucasian male patient is seen in RV on 10/03/2020 , Trevor Rollins underwent a sleep study- 04-05-2020,  IMPRESSION:   1. This PSG found mild Obstructive Sleep Apnea (OSA) at an AHI of  7.7/h and REM AHI of 42.2/h., mild snoring, and severe Periodic  Limb Movement Disorder (PLMD).  2. 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  3. No prolonged hypoxemia was noted.   RECOMMENDATIONS:   1. CPAP titration study is recommended to optimize therapy.    2. Plan B is autotitration CPAP device, nasal pillow or nasal  mask, 5-15 cm water, 3 cm EPR , short RAMP, heated humidity.  3. The patient needs to avoid supine sleep.  4. Dental device or inspire implant will not resolve a REM sleep  dependent forms of apnea.   10-03-2020: The patient presents now being initiated to CPAP therapy and he is using an autotitrator with a pressure setting between 5 and 15 cmH2O and 3 cm EPR he is 100% compliant by days and 90% compliant by hours, his average use at time is 7 hours 52 minutes at night.  His residual AHI is only 0.3 but shows an excellent resolution of apnea.  There are no central apneas arising.  The 95th  percentile pressure is 11.8 cmH2O and is covered under his current settings.  He does have moderate high air leakage from his interface. Its when he sleeps on his side.   Trevor Rollins reports that he no longer needs to take a break from he has a longer car ride ahead of him, he is able to sleep in the same bedroom with his wife, he feels he feels significantly significantly more refreshed and restored in the morning.  His current Epworth sleepiness scale was endorsed at 0 points.a FSS at 20/ 63 points. He used to be in a bad mood in the morinng, now he is just fine and his family appreciated this.      Originally sent from NP [facility], Cardiology.   Chief concern according to patient :  The patient was hospitalized at [facility], Trevor Rollins was admitted with septic shock he had been presenting on 28 October 2019 finally to the hospital following 3 days of nausea, vomiting diarrhea and severe dehydration intense chills, was first seen in urgent care with a blood pressure of only 85/68 mmHg in the emergency room he was tachypneic his oxygen pressures were very low and he had lactic acidosis.  His creatinine had risen 201.47 his AST to 89 his  ALT to 61.  Initial troponin of 208.  Covid test initially negative, chest x-ray showed cardiomegaly without edema and left lower lobe atelectasis was noted.  He was placed on pressors and stress dose steroids along with fluids IV resuscitation.  Echocardiogram demonstrated an EF of 40% with global hypokinesis.  Repeat Covid testing was negative that time troponins troponins were elevated starting at 208-230 then 2 2268 and finally 1664.  Dr. 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  And he recommended an outpatient sleep study.   Repeat Echocardiogram was performed after chest pain in 01-2020. Echo showed recovery. He attributes the stress to his membership in the local [employer].      Trevor Rollins  a right handed  Caucasian male  who  has a past medical history of Allergy, Anxiety, Asthma, DDD (degenerative disc disease), Deafness in left ear, ED (erectile dysfunction), Hyperlipidemia, Hypertension, Hypertriglyceridemia, and Insomnia. Recent septic shock. CHF. He has chronic sciatica and is followed by neurosurgeon.  The patient also has deafness in the left ear, there has been a history of he has insomnia but only many years ago did he have insomnia related to chronic pain and this has been treated by a separate specialist surgeon was Dr. Odette Fraction he is a neurosurgery patient and had an implanted pulse generator.  Battery exchanged in 12-24-2016.  He had an oral ankle fracture in 2016 wisdom tooth were extracted.  His medication list was reviewed.  Echocardiogram was reviewed 01-25-20 by estimation back to 55% left ventricle normal function no regional wall motion abnormalities noted anymore.nteresting history of SEPSIS and CARDIOMYOPATHY , Covid negative -  2) patient has been taking prn opiates, and he is not sleeping well. He has a lot of twitching, snoring and EDS.  3) he has poor sleep quality but also needs more sleep to feel refreshed.       Family medical /sleep history: No other family member on CPAP with OSA, insomnia, sleep walkers.    Social history:  Patient is working as Manufacturing engineer-  Therapist, [name]  and lives in a household with spouse and 85 year old daughter, and 34 year old Archivist.   Travelling again, walking steady.  One dog and one cat. Tobacco use; none .  ETOH use ; 2 a day. Caffeine intake in form of  1 coke in AM. Regular exercise is limited..    Sleep habits are as follows: The patient's dinner time is between 6-7 PM. The patient goes to bed at 11 PM and continues to sleep for 1-2  hours, wakes many times, sue to pain, due to sounds, snoring.   The preferred sleep position is left side and supine , with the support of 1 pillow.  Dreams are reportedly vivid-  lucid under medication.  7.30-9 AM is the usual rise time. The patient wakes up spontaneously unless he has meetings- he feels goes if he sleeps until 10 AM  He reports not feeling refreshed or restored in AM, with symptoms such as dry mouth, and residual fatigue.  Naps are taken frequently in PM , lasting from 60 minutes and are more refreshing than nocturnal sleep.    Review of Systems: Out of a complete 14 system review, the patient complains of only the following symptoms, and all other reviewed systems are negative.:  Fatigue, sleepiness , snoring, fragmented sleep- hypersomnia.    How likely are you to doze in the following situations:  0 = not likely, 1 = slight chance, 2 = moderate chance, 3 = high chance   Sitting and Reading? Watching Television? Sitting inactive in a public place (theater or meeting)? As a passenger in a car for an hour without a break? Lying down in the afternoon when circumstances permit? Sitting and talking to someone? Sitting quietly after lunch without alcohol? In a car, while stopped for a few minutes in traffic?   Total =now 0 from  9/ 24 points - reports some sleep attacks.   FSS endorsed at  20 from 41/ 63 points.   Social History   Socioeconomic History   Marital status: Married    Spouse name: Not on file   Number of children: Not on file   Years of education: Not on file   Highest education level: Not on file  Occupational History   Not on file  Tobacco Use   Smoking status: Never   Smokeless tobacco: Never  Vaping Use   Vaping Use: Never used  Substance and Sexual Activity   Alcohol use: Yes    Comment: occasional   Drug use: No   Sexual activity: Not on file  Other Topics Concern   Not on file  Social History Narrative   Not on file   Social Determinants of Health   Financial Resource Strain: Not on file  Food Insecurity: Not on file  Transportation Needs: Not on file  Physical Activity: Not on file  Stress: Not on file   Social Connections: Not on file    Family History  Problem Relation Age of Onset   Cancer Sister    Stroke Other     Past Medical History:  Diagnosis Date   Allergy    Anxiety    Asthma    DDD (degenerative disc disease)    pt denies   Deafness in left ear    ED (erectile dysfunction)    Hyperlipidemia    Hypertension    Hypertriglyceridemia    Insomnia     Past Surgical History:  Procedure Laterality Date   ORIF ANKLE FRACTURE Right 03/01/2014   Procedure: OPEN REDUCTION INTERNAL FIXATION (ORIF) RIGHT ANKLE FRACTURE;  Surgeon: Cheral Almas, MD;  Location: [facility] OR;  Service: Orthopedics;  Laterality: Right;   SPINAL CORD STIMULATOR BATTERY EXCHANGE N/A 12/24/2016   Procedure: Replacement of implantable pulse generator,;  Surgeon: Odette Fraction, MD;  Location: [facility];  Service: Neurosurgery;  Laterality: N/A;  Replacement of implantable pulse generator, possible replacement of leads on spinal cord stimulator   SPINAL CORD STIMULATOR IMPLANT     TONSILLECTOMY     WISDOM TOOTH EXTRACTION       Current Outpatient Medications on File Prior to Visit  Medication Sig Dispense Refill   albuterol (VENTOLIN HFA) 108 (90 Base) MCG/ACT inhaler INHALE 4 PUFFS INTO THE LUNGS EVERY 4-6 HOURS AS NEEDED FOR UP TO 30 DAYS. 18 g 0   amLODipine (NORVASC) 10 MG tablet Take 10 mg by mouth daily.     carvedilol (COREG) 3.125 MG tablet TAKE (1) TABLET TWICE DAILY. 60 tablet 6   citalopram (CELEXA) 20 MG tablet Take 1 tablet (20 mg total) by mouth daily. 90 tablet 1   diazepam (VALIUM) 5 MG tablet Take 1 tablet (5 mg total) by mouth every 12 (twelve) hours as needed for anxiety or muscle spasms. 30 tablet 2   fluticasone (FLONASE) 50 MCG/ACT nasal spray 2 SPRAYS INTO BOTH NOSTRILS DAILY. 16 g 5   fluticasone (  FLOVENT HFA) 110 MCG/ACT inhaler Inhale 2 puffs into the lungs in the morning and at bedtime. 12 g 0   losartan (COZAAR) 50 MG tablet Take 1 tablet (50 mg total) by mouth daily. 90 tablet  1   methocarbamol (ROBAXIN) 750 MG tablet Take 1 tablet (750 mg total) by mouth 3 (three) times daily. 30 tablet 5   No current facility-administered medications on file prior to visit.    Allergies  Allergen Reactions   Penicillins Other (See Comments)    Childhood reaction "redness"  Has patient had a PCN reaction causing immediate rash, facial/tongue/throat swelling, SOB or lightheadedness with hypotension: No Has patient had a PCN reaction causing severe rash involving mucus membranes or skin necrosis: No Has patient had a PCN reaction that required hospitalization: No Has patient had a PCN reaction occurring within the last 10 years: No If all of the above answers are "NO", then may proceed with Cephalosporin use.     Physical exam:  Today's Vitals   10/03/20 1313  BP: 132/81  Pulse: 98  Weight: 259 lb 8 oz (117.7 kg)  Height: 6\' 1"  (1.854 m)   Body mass index is 34.24 kg/m.   Wt Readings from Last 3 Encounters:  10/03/20 259 lb 8 oz (117.7 kg)  08/01/20 253 lb 6.4 oz (114.9 kg)  07/22/20 254 lb 9.6 oz (115.5 kg)     Ht Readings from Last 3 Encounters:  10/03/20 6\' 1"  (1.854 m)  07/22/20 6\' 1"  (1.854 m)  04/18/20 6\' 1"  (1.854 m)      General: The patient is awake, alert and appears  in chronic distress. The patient is well groomed. Head: Normocephalic, atraumatic.  Neck is supple. Mallampati 2,  neck circumference: 17.5  inches.  Nasal airflow congestion .  Retrognathia is mildly present . Dental status: intact.  There is a jaw tremor.  Cardiovascular:  Regular rate and cardiac rhythm by pulse, without distended neck veins. Respiratory: Lungs are clear to auscultation.  Skin:  Without evidence of ankle edema, or rash. Trunk: The patient's posture is seated - relaxed, and he uses a cane.    Neurologic exam : The patient is awake and alert, oriented to place and time.   Memory subjective described as intact.  Attention span & concentration ability appears  normal.  Speech is fluent,  without dysarthria, dysphonia or aphasia.  Mood and affect are appropriate, he seems calm and happy.   Cranial nerves: no loss of smell or taste reported  Pupils are equal and briskly reactive to light. Funduscopic exam deferred.   Extraocular movements in vertical and horizontal planes were intact and without nystagmus. No Diplopia. Visual fields by finger perimetry are intact. Hearing was intact to soft voice and finger rubbing.    Facial sensation intact to fine touch.  Facial motor strength is symmetric and tongue and uvula move midline.  Neck ROM : rotation, tilt and flexion extension were normal for age and shoulder shrug was symmetrical.    Motor exam:  Symmetric bulk, tone and ROM in upper extremities- I ma not  evluating gait and strength for the lower extremities. .   Normal tone without cog wheeling, symmetric grip strength .  Sensory:  Fine touch  and vibration were normal.  Proprioception tested in the upper extremities was normal.  Coordination: /Deep tendon reflexes: in the upper and lower extremities are symmetric and intact.  Babinski response was deferred .       After spending a total  time of  25  minutes face to face and additional time for physical and neurologic examination, review of laboratory studies,  personal review of imaging studies, reports and results of other testing and review of referral information / records as far as provided in visit, I have established the following assessments:  1) sleep apnea confirmed and now CPAP dependent, can't do well without it.    My Plan is to proceed with:  1) continue CPAP and see me in 12 month.    I would like to thank Dr Wyline MoodBranch, MD  for allowing me to meet with and to take care of this pleasant patient.   In short, Irean HongBrent H Fooks is doing very well on CPAP.   Electronically signed by: Melvyn Novasarmen Kristyl Athens, MD 10/03/2020 1:52 PM  [facility] and [facility]  certified by The [employer] of [employer] Medicine and Diplomate of the [employer] of [employer]. Board certified In Neurology through the ABPN, Fellow of the [employer] of [employer]. Medical Director of [facility].

## 2020-10-07 ENCOUNTER — Other Ambulatory Visit: Payer: Self-pay | Admitting: Family Medicine

## 2020-11-25 ENCOUNTER — Other Ambulatory Visit: Payer: Self-pay | Admitting: Family Medicine

## 2020-12-04 IMAGING — DX DG CHEST 1V PORT
1 series · 1 of 1 positions shown · non-contrast
Comparison: None.

CLINICAL DATA: Hypotension

EXAM:
PORTABLE CHEST 1 VIEW

[chest ap]
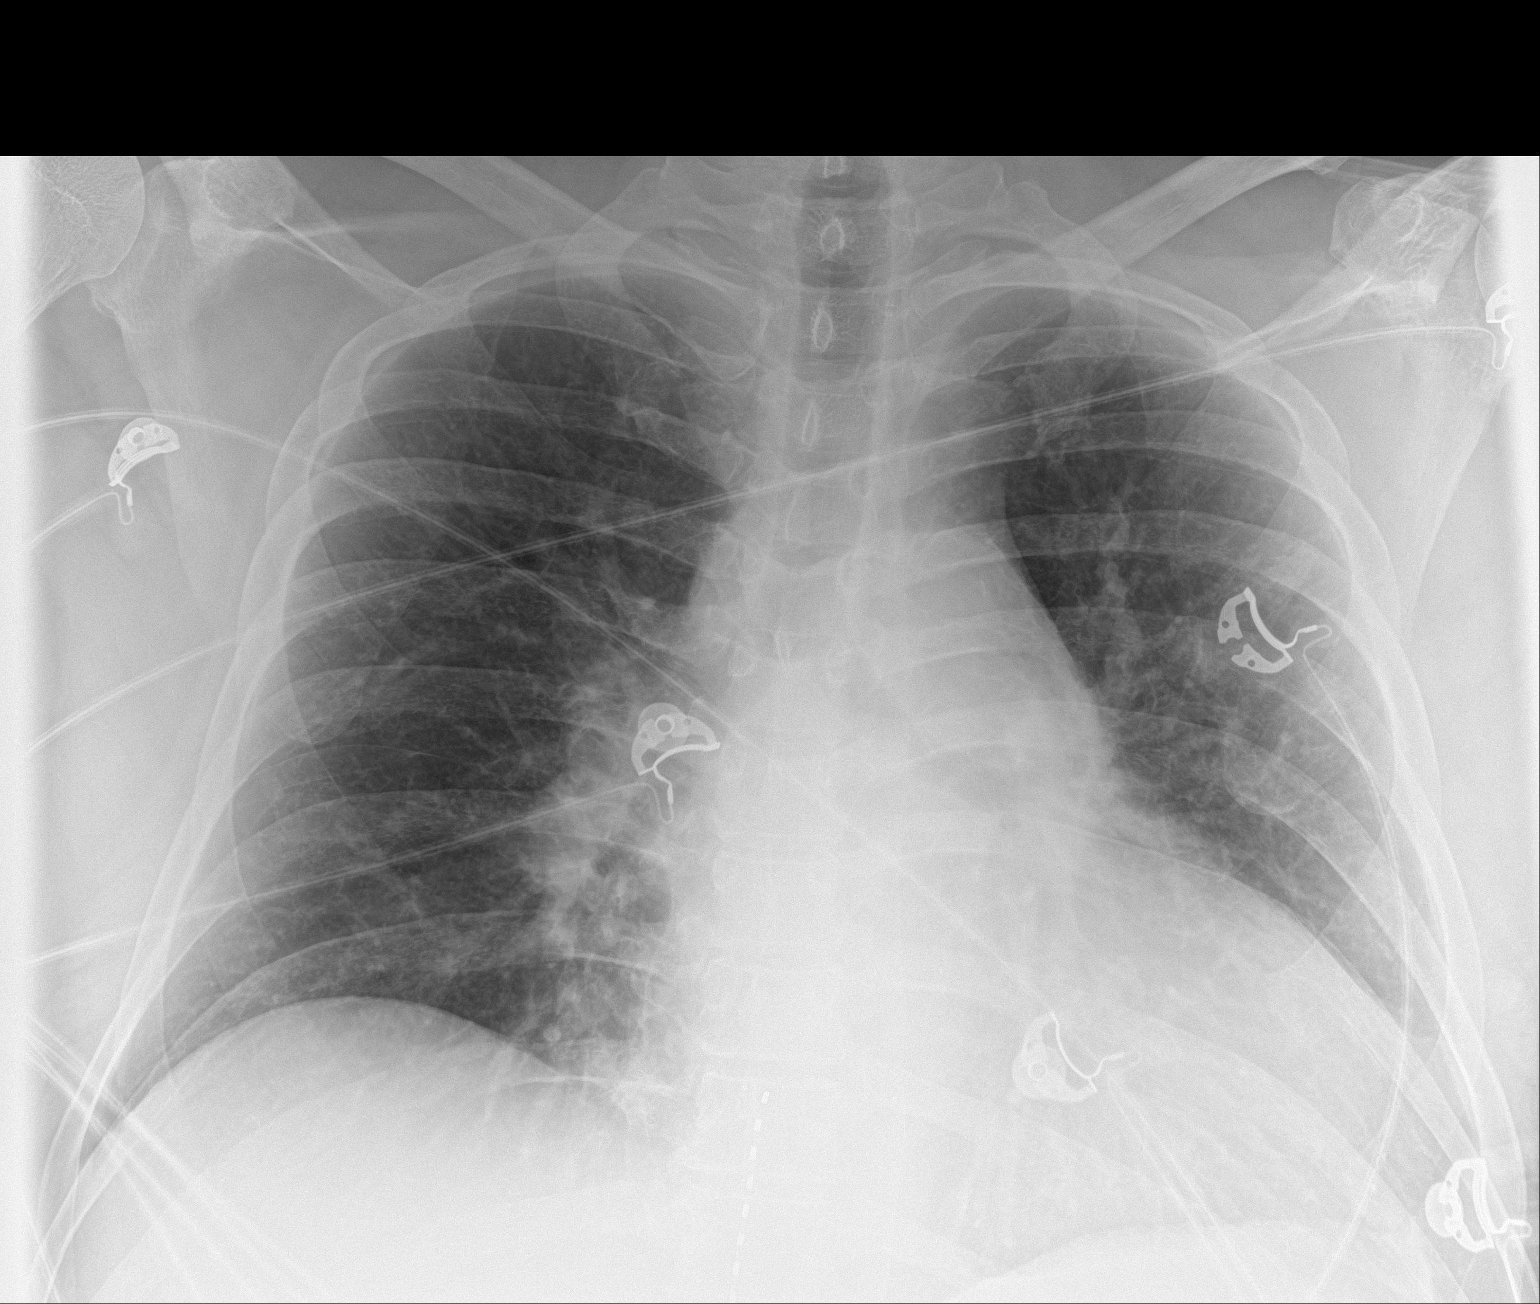

[1 of 1 positions shown; findings below may reference images not displayed]

FINDINGS: The cardiomediastinal silhouette is enlarged in contour.Perihilar
vascular fullness without overt edema. No pleural effusion. No
pneumothorax. LEFT lower lobe atelectasis. Partial visualization of
spinal stimulator lead. Visualized abdomen is unremarkable.
Multilevel degenerative changes of the thoracic spine.
IMPRESSION: Cardiomegaly without overt edema. LEFT lower lobe atelectasis.

## 2020-12-04 IMAGING — CT CT ABD-PELV W/O CM
2 of 4 series · 16 of 46 positions shown, 18 images · non-contrast
Comparison: None.

CLINICAL DATA: 49-year-old male with a history of abdominal pain
nausea and vomiting

EXAM:
CT ABDOMEN AND PELVIS WITHOUT CONTRAST
TECHNIQUE: Multidetector CT imaging of the abdomen and pelvis was performed
following the standard protocol without IV contrast.

[Series 3: a/p w/o 5mm · axial · non-contrast · 0.98mm/px · z∈[+855,+1375]mm · 13 of 114 slices shown, 15 images]
[im 5/114  soft-tissue]
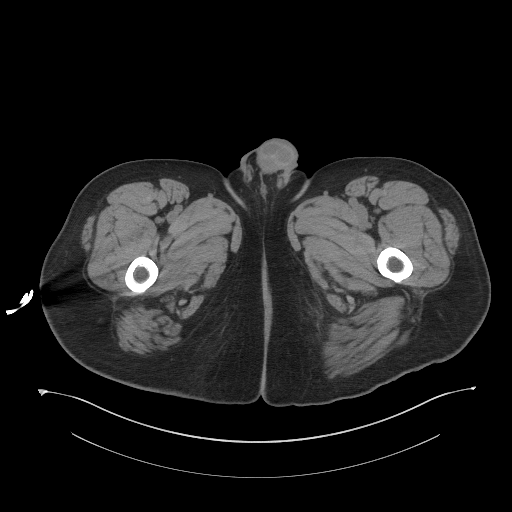
[im 5/114  bone]
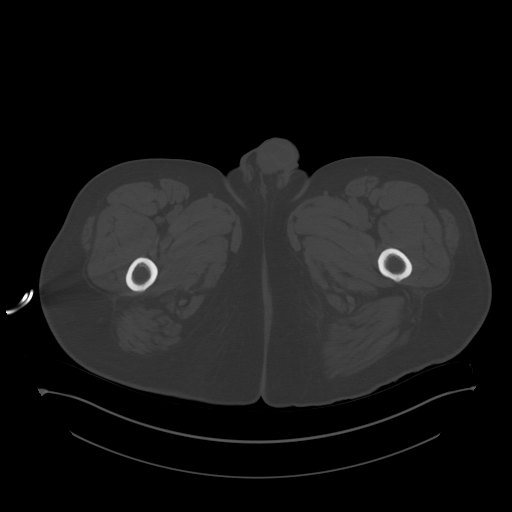
[im 15/114  soft-tissue]
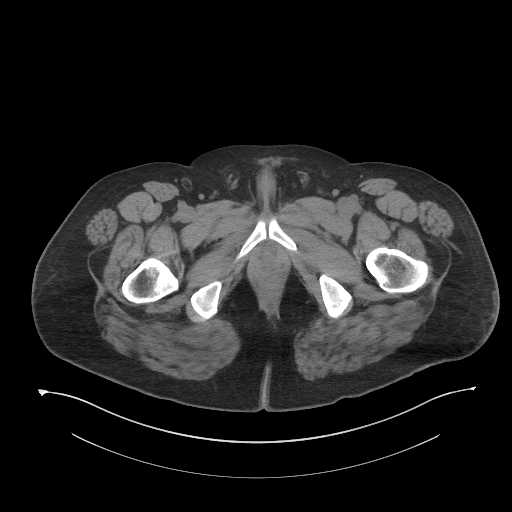
[im 24/114  soft-tissue]
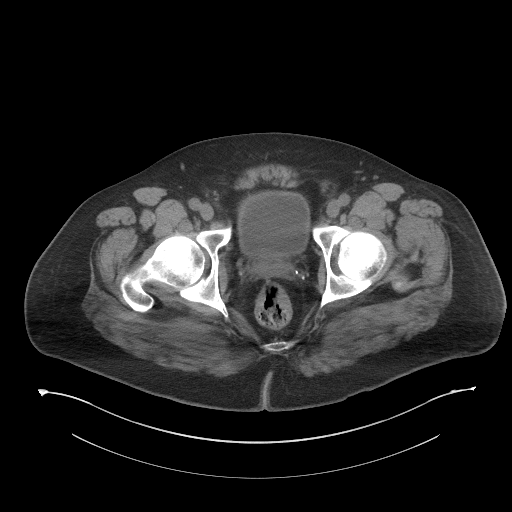
[im 33/114  soft-tissue]
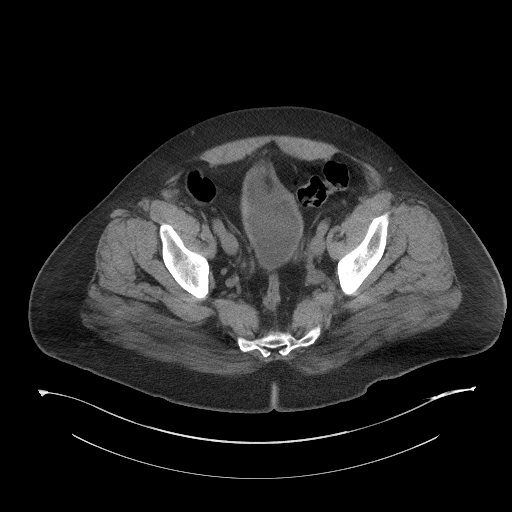
[im 38/114  soft-tissue]
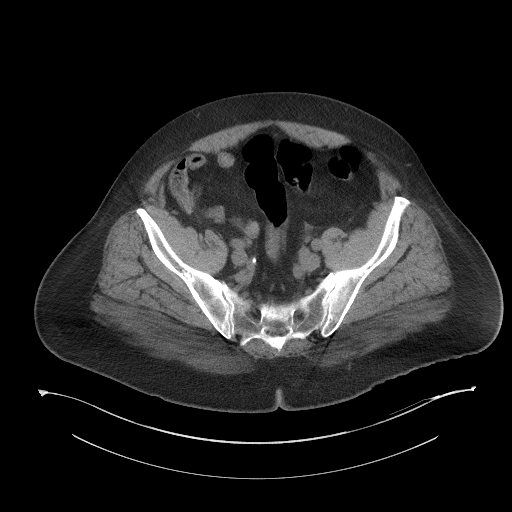
[im 48/114  soft-tissue]
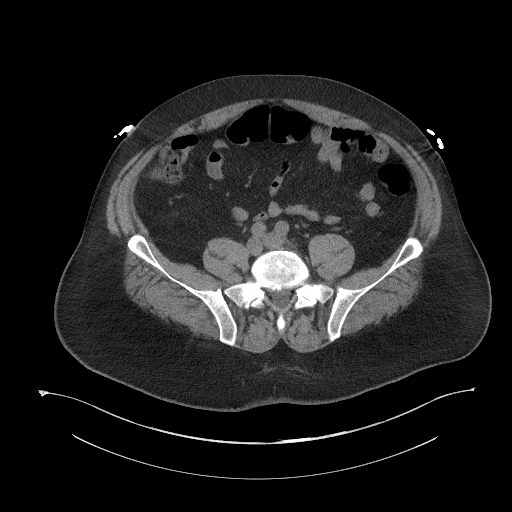
[im 57/114  soft-tissue]
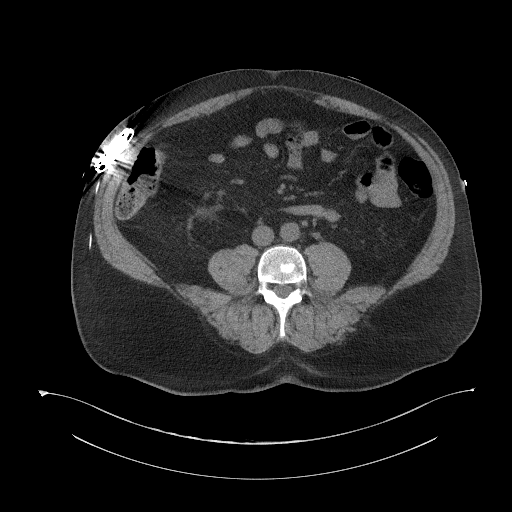
[im 66/114  soft-tissue]
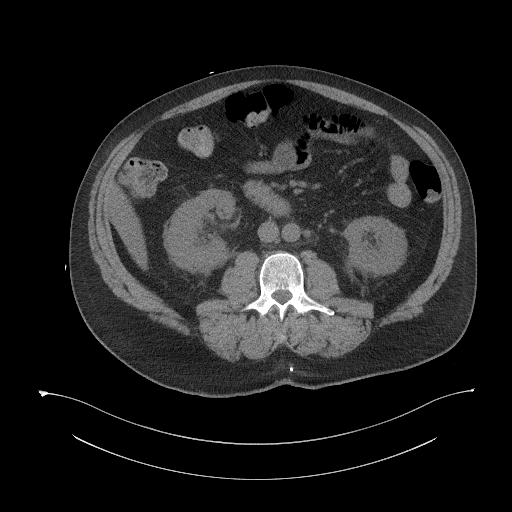
[im 76/114  soft-tissue]
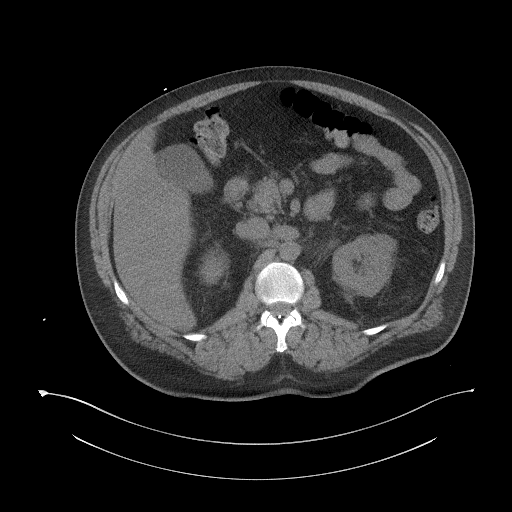
[im 76/114  bone]
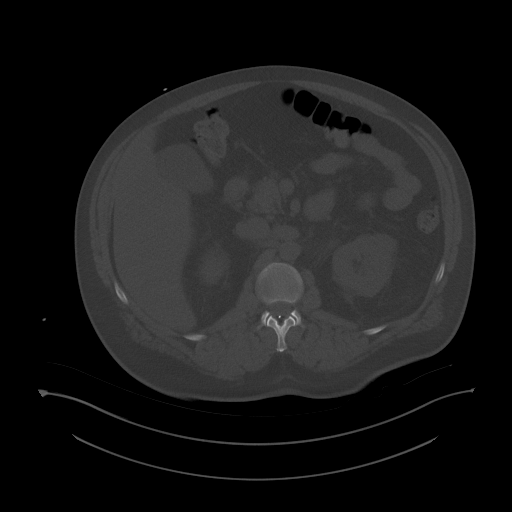
[im 81/114  soft-tissue]
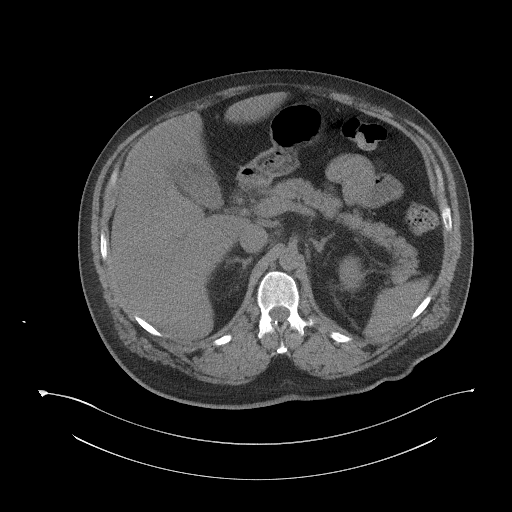
[im 90/114  soft-tissue]
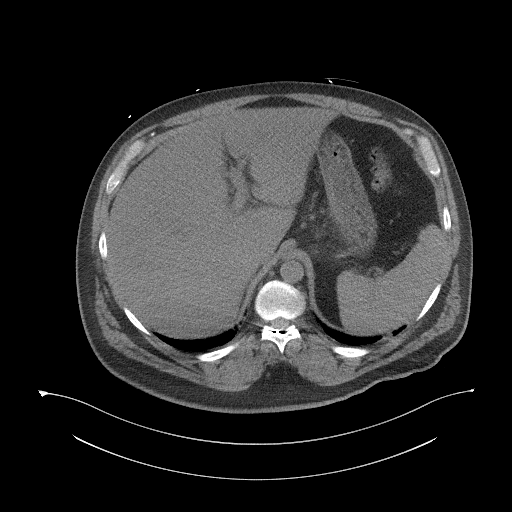
[im 99/114  soft-tissue]
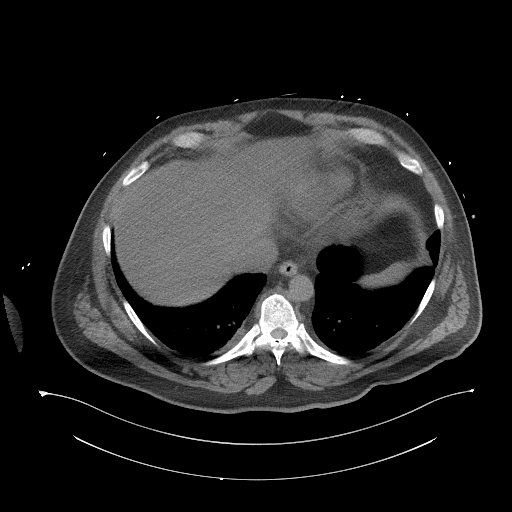
[im 109/114  soft-tissue]
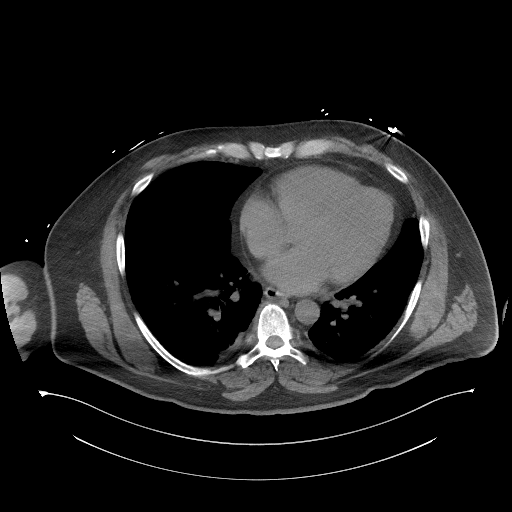

[Series 6: a/p w/o cor · coronal · non-contrast · 1.17mm/px · 3 of 196 slices shown]
[im 66/196  soft-tissue]
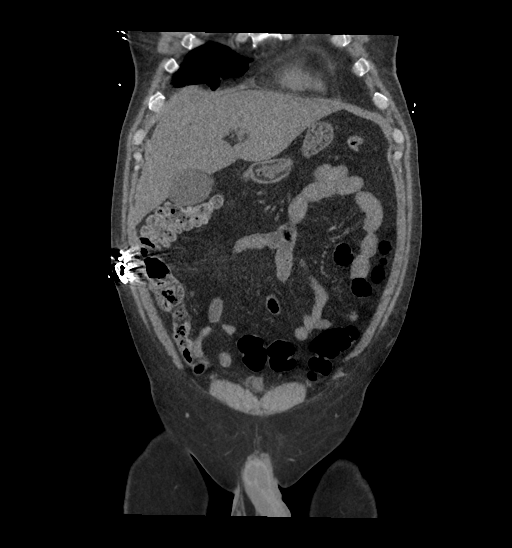
[im 87/196  soft-tissue]
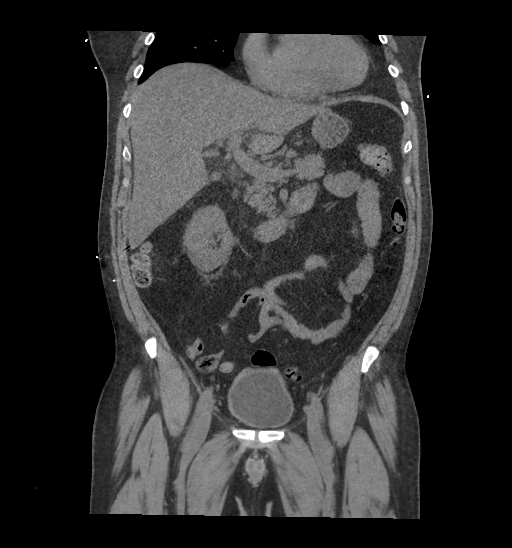
[im 109/196  soft-tissue]
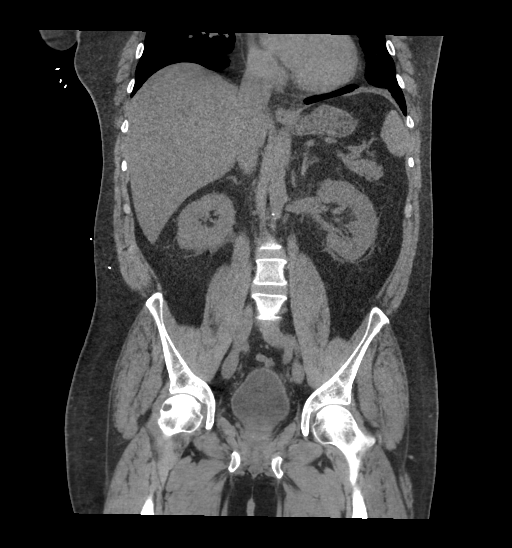

[16 of 46 positions shown; findings below may reference images not displayed]

FINDINGS: Lower chest: Atelectasis/scarring at the lung bases.

Hepatobiliary: Decreased attenuation of the liver parenchyma.
Unremarkable gallbladder. No significant pericholecystic fluid or
inflammatory changes.

Hazy edema within the fat in the liver hilum, adjacent to the
proximal duodenum.

Pancreas: Unremarkable pancreas

Spleen: Unremarkable spleen

Adrenals/Urinary Tract:

- Right adrenal gland:  Unremarkable

- Left adrenal gland: Unremarkable.

- Right kidney: No hydronephrosis, nephrolithiasis, ureteral
dilation. Mild perinephric stranding/edema.

- Left Kidney: No hydronephrosis, nephrolithiasis, ureteral
dilation. Mild perinephric stranding/edema

- Urinary Bladder: Urinary bladder relatively decompressed

Stomach/Bowel:

- Stomach: Unremarkable.

- Small bowel: Unremarkable

- Appendix: Normal.

- Colon: Unremarkable.

Vascular/Lymphatic: No significant vascular findings are present. No
enlarged abdominal or pelvic lymph nodes.

Reproductive: Prostate calcifications.

Other: Generator within the anterior right abdominal soft tissues.
Electrodes terminate within the canal in the lower thoracic spine.

Musculoskeletal: No acute displaced fracture. Vacuum disc phenomenon
at L5-S1. Early avascular necrosis of the bilateral femoral heads.
IMPRESSION: Nonspecific edema in the hilum of the liver and near the proximal
duodenum. Correlation with liver enzymes and pancreatic enzymes may
be useful, and if there is ongoing concern for acute process in the
upper abdomen, consideration of contrast-enhanced CT.

Negative for bowel obstruction.

Steatosis.

Early avascular necrosis of the bilateral femoral head.

## 2020-12-05 IMAGING — DX DG CHEST 1V PORT
1 series · 1 of 1 positions shown · non-contrast
Comparison: 10/28/2019; CT abdomen pelvis-10/28/2019

CLINICAL DATA: Sepsis

EXAM:
PORTABLE CHEST 1 VIEW

[chest ap]
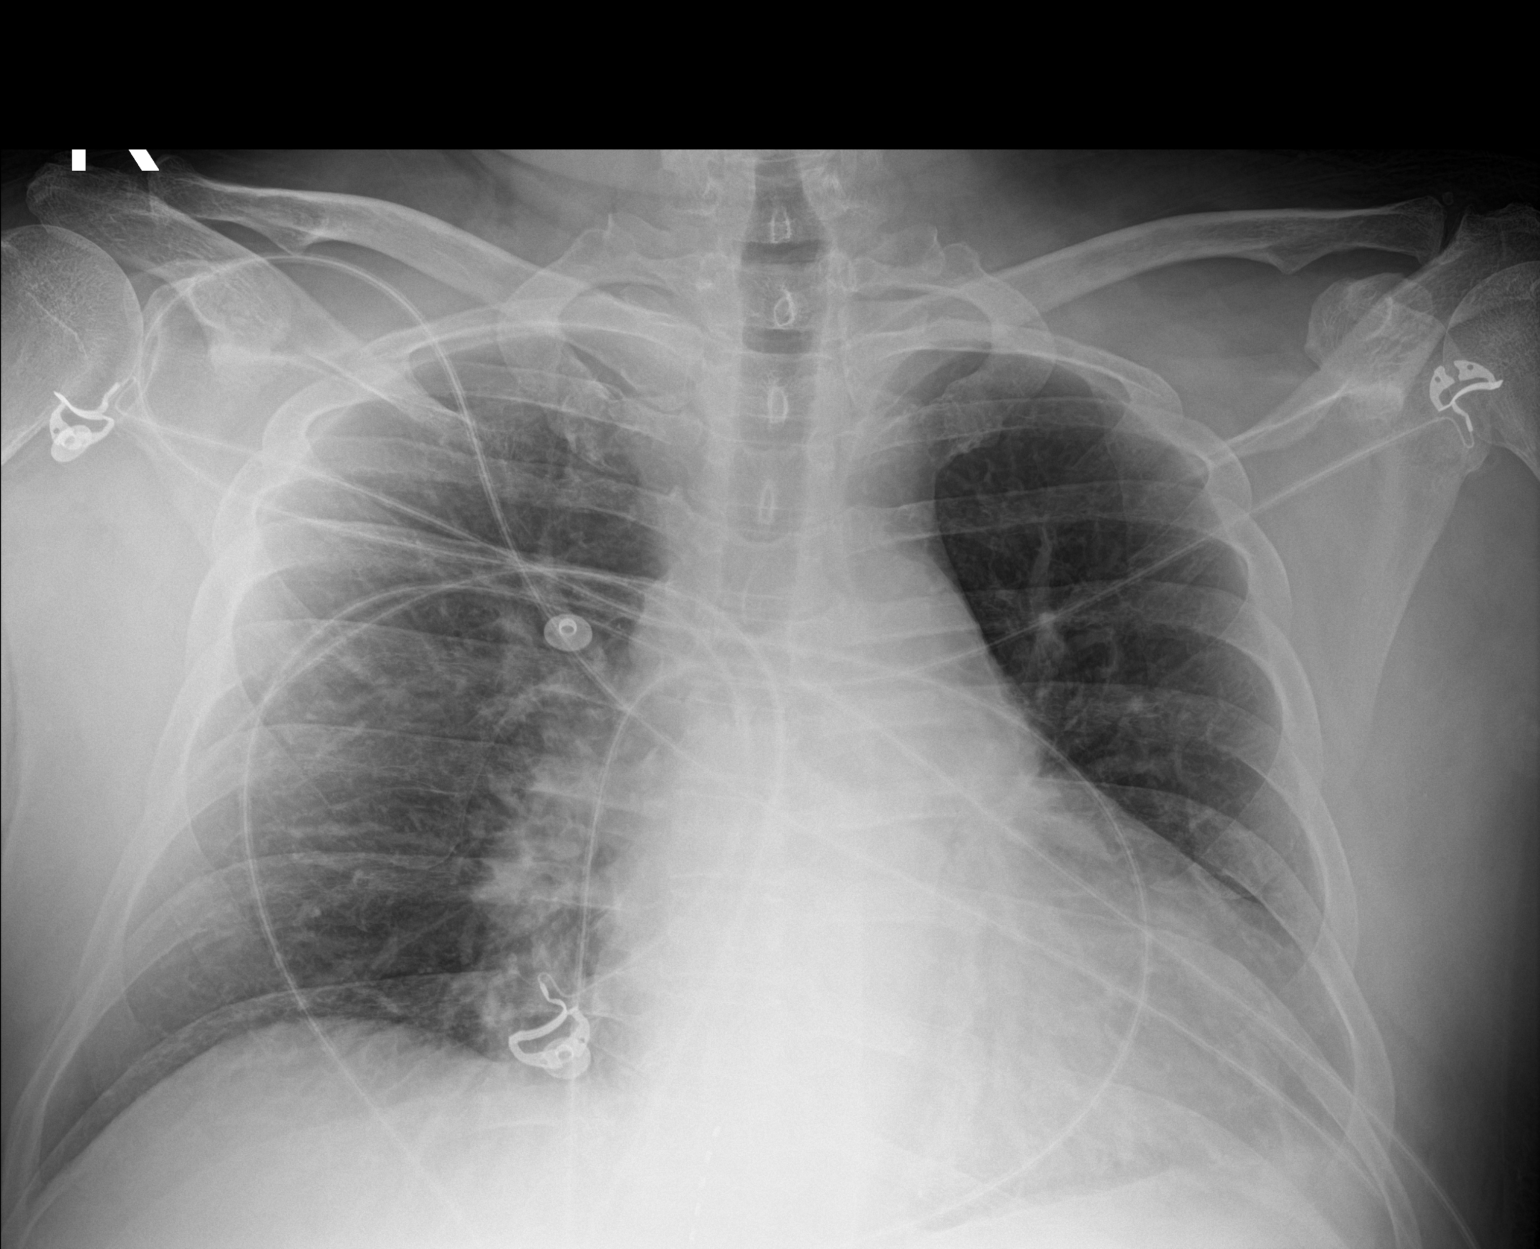

[1 of 1 positions shown; findings below may reference images not displayed]

FINDINGS: Grossly unchanged enlarged cardiac silhouette and mediastinal
contours. Persistently reduced lung volumes. Grossly unchanged
perihilar and bilateral medial basilar heterogeneous opacities. No
new focal airspace opacities. No pleural effusion or pneumothorax.
No evidence of edema. No acute osseous abnormalities.
IMPRESSION: Similar findings cardiomegaly, perihilar and bibasilar atelectasis
without superimposed acute cardiopulmonary disease on this AP
portable examination.

## 2020-12-20 ENCOUNTER — Other Ambulatory Visit: Payer: Self-pay | Admitting: Family Medicine

## 2020-12-20 NOTE — Telephone Encounter (Signed)
Sent my chart message 12/20/20 

## 2020-12-30 NOTE — Telephone Encounter (Signed)
Sent second request 12/30/20

## 2021-01-23 ENCOUNTER — Ambulatory Visit: Payer: BC Managed Care – PPO | Admitting: Cardiology

## 2021-01-30 NOTE — Telephone Encounter (Signed)
Called and sent 2 my chart messages no response

## 2021-03-19 ENCOUNTER — Other Ambulatory Visit: Payer: Self-pay | Admitting: Family Medicine

## 2021-03-20 NOTE — Telephone Encounter (Signed)
Sent my chart message 03/20/21 °

## 2021-03-21 ENCOUNTER — Other Ambulatory Visit: Payer: Self-pay

## 2021-03-21 ENCOUNTER — Encounter: Payer: Self-pay | Admitting: Family Medicine

## 2021-03-21 ENCOUNTER — Ambulatory Visit: Payer: BC Managed Care – PPO | Admitting: Family Medicine

## 2021-03-21 VITALS — BP 119/81 | HR 86 | Temp 98.6°F | Ht 73.0 in | Wt 251.4 lb

## 2021-03-21 DIAGNOSIS — E785 Hyperlipidemia, unspecified: Secondary | ICD-10-CM

## 2021-03-21 DIAGNOSIS — L309 Dermatitis, unspecified: Secondary | ICD-10-CM

## 2021-03-21 DIAGNOSIS — Z862 Personal history of diseases of the blood and blood-forming organs and certain disorders involving the immune mechanism: Secondary | ICD-10-CM | POA: Diagnosis not present

## 2021-03-21 DIAGNOSIS — Z8679 Personal history of other diseases of the circulatory system: Secondary | ICD-10-CM | POA: Insufficient documentation

## 2021-03-21 DIAGNOSIS — I10 Essential (primary) hypertension: Secondary | ICD-10-CM | POA: Diagnosis not present

## 2021-03-21 DIAGNOSIS — R739 Hyperglycemia, unspecified: Secondary | ICD-10-CM

## 2021-03-21 DIAGNOSIS — F411 Generalized anxiety disorder: Secondary | ICD-10-CM

## 2021-03-21 DIAGNOSIS — G4733 Obstructive sleep apnea (adult) (pediatric): Secondary | ICD-10-CM

## 2021-03-21 HISTORY — DX: Dermatitis, unspecified: L30.9

## 2021-03-21 MED ORDER — CITALOPRAM HYDROBROMIDE 20 MG PO TABS: 20.0000 mg | ORAL_TABLET | Freq: Every day | ORAL | 3 refills | Status: DC

## 2021-03-21 NOTE — Patient Instructions (Signed)
Medication refilled.  Labs today.  Follow up in 6 months.  Take care  Dr. Lacinda Axon

## 2021-03-21 NOTE — Assessment & Plan Note (Signed)
Stable. Doing well on CPAP. 

## 2021-03-21 NOTE — Progress Notes (Signed)
Subjective:  Patient ID: Trevor Rollins, male    DOB: 1971/01/03  Age: 51 y.o. MRN: [id number]  CC: Chief Complaint  Patient presents with   Establish Care   Medication Refill    Needs refill on Citalopram    HPI:  51 year old male with hypertension, OSA, complex regional pain syndrome, lumbar radiculopathy, history of cardiomyopathy, anxiety, hyperlipidemia presents for follow-up.  Patient's anxiety is stable on Celexa.  He has been out of his medication for 2 days.  He states that he is starting to feel some negative effects.  He is in need of his medication.  He states that he is doing well with CPAP.  Recently diagnosed with OSA and is tolerating CPAP well.  He is sleeping well.  Hypertension is well controlled.  He is currently on amlodipine, carvedilol, and losartan.  He has a history of cardiomyopathy following hospital admission for shock (September 2021).  Patient has ongoing eczema.  He has areas on his left lower extremity that he is concerned about.  He would like me to examine this today.  Patient Active Problem List   Diagnosis Date Noted   History of cardiomyopathy 03/21/2021   Eczema 03/21/2021   OSA (obstructive sleep apnea) 04/17/2020   Lumbar radiculopathy, chronic 03/06/2020   RSD (reflex sympathetic dystrophy) 08/09/2019   Primary hypertension 09/08/2012   Hyperlipidemia 09/08/2012   Generalized anxiety disorder 09/08/2012    Social Hx   Social History   Socioeconomic History   Marital status: Married    Spouse name: Not on file   Number of children: Not on file   Years of education: Not on file   Highest education level: Not on file  Occupational History   Not on file  Tobacco Use   Smoking status: Never   Smokeless tobacco: Never  Vaping Use   Vaping Use: Never used  Substance and Sexual Activity   Alcohol use: Yes    Comment: occasional   Drug use: No   Sexual activity: Not on file  Other Topics Concern   Not on file  Social History  Narrative   Not on file   Social Determinants of Health   Financial Resource Strain: Not on file  Food Insecurity: Not on file  Transportation Needs: Not on file  Physical Activity: Not on file  Stress: Not on file  Social Connections: Not on file    Review of Systems Per HPI  Objective:  BP 119/81    Pulse 86    Temp 98.6 F (37 C) (Oral)    Ht '6\' 1"'  (1.854 m)    Wt 251 lb 6.4 oz (114 kg)    SpO2 96%    BMI 33.17 kg/m   BP/Weight 03/21/2021 10/03/2020 0/92/3300  Systolic BP 762 263 335  Diastolic BP 81 81 84  Wt. (Lbs) 251.4 259.5 253.4  BMI 33.17 34.24 33.43    Physical Exam Constitutional:      Appearance: Normal appearance. He is obese.  HENT:     Head: Normocephalic and atraumatic.  Cardiovascular:     Rate and Rhythm: Normal rate and regular rhythm.  Pulmonary:     Effort: Pulmonary effort is normal.     Breath sounds: Normal breath sounds. No wheezing, rhonchi or rales.  Skin:    Comments: Nummular eczema noted on the left lower extremity.  Neurological:     Mental Status: He is alert.    Lab Results  Component Value Date   WBC 7.1 11/01/2019  HGB 11.8 (L) 11/01/2019   HCT 36.2 (L) 11/01/2019   PLT 209 11/01/2019   GLUCOSE 115 (H) 11/01/2019   CHOL 199 08/19/2012   TRIG 211 (H) 08/19/2012   HDL 48 08/19/2012   LDLCALC 109 (H) 08/19/2012   ALT 44 10/30/2019   AST 34 10/30/2019   NA 142 11/01/2019   K 3.9 11/01/2019   CL 108 11/01/2019   CREATININE 1.53 (H) 11/01/2019   BUN 26 (H) 11/01/2019   CO2 25 11/01/2019   INR 0.97 12/24/2016   HGBA1C 5.4 10/29/2019     Assessment & Plan:   Problem List Items Addressed This Visit       Cardiovascular and Mediastinum   Primary hypertension    Well-controlled.  Continue amlodipine, losartan, and carvedilol.      Relevant Orders   CMP14+EGFR     Respiratory   OSA (obstructive sleep apnea)    Stable.  Doing well on CPAP.        Musculoskeletal and Integument   Eczema    Patient was  reassured.  Patient was concerned that this may be something other than eczema.  Advised use of his home topical steroid.        Other   Hyperlipidemia   Relevant Orders   Lipid panel   Generalized anxiety disorder - Primary    Stable.  Continue Celexa.  Refilled today.      Relevant Medications   citalopram (CELEXA) 20 MG tablet   Other Visit Diagnoses     History of anemia       Relevant Orders   CBC   Hyperglycemia       Relevant Orders   Hemoglobin A1c       Meds ordered this encounter  Medications   citalopram (CELEXA) 20 MG tablet    Sig: Take 1 tablet (20 mg total) by mouth daily.    Dispense:  90 tablet    Refill:  3    Follow-up:  Return in about 6 months (around 09/18/2021).  [place]

## 2021-03-21 NOTE — Assessment & Plan Note (Signed)
Stable.  Continue Celexa.  Refilled today. °

## 2021-03-21 NOTE — Assessment & Plan Note (Signed)
Well-controlled.  Continue amlodipine, losartan, and carvedilol.

## 2021-03-21 NOTE — Assessment & Plan Note (Signed)
Patient was reassured.  Patient was concerned that this may be something other than eczema.  Advised use of his home topical steroid.

## 2021-03-24 NOTE — Telephone Encounter (Signed)
Patient had appointment on 03/21/21

## 2021-04-15 ENCOUNTER — Encounter: Payer: Self-pay | Admitting: Cardiology

## 2021-04-15 ENCOUNTER — Ambulatory Visit: Payer: BC Managed Care – PPO | Admitting: Cardiology

## 2021-04-15 VITALS — BP 116/80 | HR 88 | Ht 73.0 in | Wt 244.2 lb

## 2021-04-15 DIAGNOSIS — Z8679 Personal history of other diseases of the circulatory system: Secondary | ICD-10-CM

## 2021-04-15 DIAGNOSIS — I10 Essential (primary) hypertension: Secondary | ICD-10-CM

## 2021-04-15 MED ORDER — CARVEDILOL 3.125 MG PO TABS: ORAL_TABLET | ORAL | 3 refills | Status: DC

## 2021-04-15 MED ORDER — LOSARTAN POTASSIUM 50 MG PO TABS: 50.0000 mg | ORAL_TABLET | Freq: Every day | ORAL | 3 refills | Status: DC

## 2021-04-15 MED ORDER — AMLODIPINE BESYLATE 10 MG PO TABS: 10.0000 mg | ORAL_TABLET | Freq: Every day | ORAL | 3 refills | Status: DC

## 2021-04-15 NOTE — Patient Instructions (Addendum)

## 2021-04-15 NOTE — Progress Notes (Signed)
? ? ? ?Clinical Summary ?Mr. Trevor Rollins is a 51 y.o.male seen today for follow up of the following medical problems.  ? ? ? ? ?1.History of cardiomyopathy ?-10/2019 echo LVEF 40-45% by echo.Mod RV dysfunction. LV dysfunction in setting of septic shock, possibly stress induced CM given it resolved with f/u echo. Elevated trop at the time, with resolution of systolic dysfunction have not pursued ishcemic testing.  ?- 01/2020 echo: 55-60%. Normal RV ?- compliant with meds ? ? ?2. OSA ?- on cpap ?- followed by Dr Brett Fairy ?- doing well on cpap ? ? ?3. HTN ?- compliant with meds ? ?Past Medical History:  ?Diagnosis Date  ? Allergy   ? Anxiety   ? Asthma   ? DDD (degenerative disc disease)   ? pt denies  ? Deafness in left ear   ? Eczema 03/21/2021  ? ED (erectile dysfunction)   ? Hyperlipidemia   ? Hypertension   ? Hypertriglyceridemia   ? Insomnia   ? ? ? ?Allergies  ?Allergen Reactions  ? Penicillins Other (See Comments)  ?  Childhood reaction "redness"  ?Has patient had a PCN reaction causing immediate rash, facial/tongue/throat swelling, SOB or lightheadedness with hypotension: No ?Has patient had a PCN reaction causing severe rash involving mucus membranes or skin necrosis: No ?Has patient had a PCN reaction that required hospitalization: No ?Has patient had a PCN reaction occurring within the last 10 years: No ?If all of the above answers are "NO", then may proceed with Cephalosporin use. ?  ? ? ? ?Current Outpatient Medications  ?Medication Sig Dispense Refill  ? amLODipine (NORVASC) 10 MG tablet TAKE 1 TABLET BY MOUTH ONCE DAILY. 30 tablet 6  ? carvedilol (COREG) 3.125 MG tablet TAKE (1) TABLET TWICE DAILY. 60 tablet 6  ? citalopram (CELEXA) 20 MG tablet Take 1 tablet (20 mg total) by mouth daily. 90 tablet 3  ? losartan (COZAAR) 50 MG tablet TAKE 1 TABLET BY MOUTH ONCE DAILY. 30 tablet 3  ? ?No current facility-administered medications for this visit.  ? ? ? ?Past Surgical History:  ?Procedure Laterality Date  ? ORIF  ANKLE FRACTURE Right 03/01/2014  ? Procedure: OPEN REDUCTION INTERNAL FIXATION (ORIF) RIGHT ANKLE FRACTURE;  Surgeon: Marianna Payment, MD;  Location: [facility];  Service: Orthopedics;  Laterality: Right;  ? SPINAL CORD STIMULATOR BATTERY EXCHANGE N/A 12/24/2016  ? Procedure: Replacement of implantable pulse generator,;  Surgeon: Clydell Hakim, MD;  Location: [facility];  Service: Neurosurgery;  Laterality: N/A;  Replacement of implantable pulse generator, possible replacement of leads on spinal cord stimulator  ? SPINAL CORD STIMULATOR IMPLANT    ? TONSILLECTOMY    ? WISDOM TOOTH EXTRACTION    ? ? ? ?Allergies  ?Allergen Reactions  ? Penicillins Other (See Comments)  ?  Childhood reaction "redness"  ?Has patient had a PCN reaction causing immediate rash, facial/tongue/throat swelling, SOB or lightheadedness with hypotension: No ?Has patient had a PCN reaction causing severe rash involving mucus membranes or skin necrosis: No ?Has patient had a PCN reaction that required hospitalization: No ?Has patient had a PCN reaction occurring within the last 10 years: No ?If all of the above answers are "NO", then may proceed with Cephalosporin use. ?  ? ? ? ? ?Family History  ?Problem Relation Age of Onset  ? Cancer Sister   ? Stroke Other   ? ? ? ?Social History ?Trevor Rollins reports that he has never smoked. He has never used smokeless tobacco. ?Trevor Rollins reports current  alcohol use. ? ? ?Review of Systems ?CONSTITUTIONAL: No weight loss, fever, chills, weakness or fatigue.  ?HEENT: Eyes: No visual loss, blurred vision, double vision or yellow sclerae.No hearing loss, sneezing, congestion, runny nose or sore throat.  ?SKIN: No rash or itching.  ?CARDIOVASCULAR: per hpi ?RESPIRATORY: No shortness of breath, cough or sputum.  ?GASTROINTESTINAL: No anorexia, nausea, vomiting or diarrhea. No abdominal pain or blood.  ?GENITOURINARY: No burning on urination, no polyuria ?NEUROLOGICAL: No headache, dizziness, syncope, paralysis, ataxia,  numbness or tingling in the extremities. No change in bowel or bladder control.  ?MUSCULOSKELETAL: No muscle, back pain, joint pain or stiffness.  ?LYMPHATICS: No enlarged nodes. No history of splenectomy.  ?PSYCHIATRIC: No history of depression or anxiety.  ?ENDOCRINOLOGIC: No reports of sweating, cold or heat intolerance. No polyuria or polydipsia.  ?. ? ? ?Physical Examination ?Today's Vitals  ? 04/15/21 1532  ?BP: 116/80  ?Pulse: 88  ?SpO2: 97%  ?Weight: 244 lb 3.2 oz (110.8 kg)  ?Height: 6\' 1"  (1.854 m)  ? ?Body mass index is 32.22 kg/m?. ? ?Gen: resting comfortably, no acute distress ?HEENT: no scleral icterus, pupils equal round and reactive, no palptable cervical adenopathy,  ?CV: RRR, no m/r/g no jvd ?Resp: Clear to auscultation bilaterally ?GI: abdomen is soft, non-tender, non-distended, normal bowel sounds, no hepatosplenomegaly ?MSK: extremities are warm, no edema.  ?Skin: warm, no rash ?Neuro:  no focal deficits ?Psych: appropriate affect ? ? ? ? ?Assessment and Plan  ?1.History of cardiomyopathy ?- probable stress induced CM in setting of septic shock, LVEF has since normalized ?- no recent symptoms ?- continue current meds ? ?2. HTN ?- at goal, continue current meds ? ? ?F/u 1 year. If doing well at 1 year could see just as needed.  ? ? ? ? ? ?Arnoldo Lenis, M.D. ?

## 2021-07-30 ENCOUNTER — Encounter: Payer: Self-pay | Admitting: Family Medicine

## 2021-07-30 ENCOUNTER — Ambulatory Visit: Payer: BC Managed Care – PPO | Admitting: Family Medicine

## 2021-07-30 DIAGNOSIS — G8929 Other chronic pain: Secondary | ICD-10-CM

## 2021-07-30 DIAGNOSIS — M545 Low back pain, unspecified: Secondary | ICD-10-CM

## 2021-07-30 MED ORDER — PREDNISONE 10 MG PO TABS: ORAL_TABLET | ORAL | 0 refills | Status: DC

## 2021-07-30 MED ORDER — DIAZEPAM 5 MG PO TABS: 5.0000 mg | ORAL_TABLET | Freq: Two times a day (BID) | ORAL | 1 refills | Status: DC | PRN

## 2021-07-30 NOTE — Progress Notes (Signed)
Subjective:  Patient ID: Trevor Rollins, male    DOB: 10-07-1970  Age: 51 y.o. MRN: [id number]  CC: Chief Complaint  Patient presents with   Spasms    Has Lumba/ sacral pain and requesting valium rx for upcoming trip     HPI:  51 year old male presents for evaluation the above.  Patient has a history of low back pain with radiculopathy.  He states that he has been experiencing muscle spasms.  He has an upcoming trip with his family and would like something for spasm as it seems to worsen with a lot of physical activity particular going up steps.  He also has responded well to prednisone in the past in regards to flares of his back pain.  He would like to have this on hand in case he has a flare.  He is otherwise doing well.  No other complaints or concerns at this time.  Patient Active Problem List   Diagnosis Date Noted   Low back pain 07/30/2021   History of cardiomyopathy 03/21/2021   Eczema 03/21/2021   OSA (obstructive sleep apnea) 04/17/2020   RSD (reflex sympathetic dystrophy) 08/09/2019   Primary hypertension 09/08/2012   Hyperlipidemia 09/08/2012   Generalized anxiety disorder 09/08/2012    Social Hx   Social History   Socioeconomic History   Marital status: Married    Spouse name: Not on file   Number of children: Not on file   Years of education: Not on file   Highest education level: Not on file  Occupational History   Not on file  Tobacco Use   Smoking status: Never   Smokeless tobacco: Never  Vaping Use   Vaping Use: Never used  Substance and Sexual Activity   Alcohol use: Yes    Comment: occasional   Drug use: No   Sexual activity: Not on file  Other Topics Concern   Not on file  Social History Narrative   Not on file   Social Determinants of Health   Financial Resource Strain: Not on file  Food Insecurity: Not on file  Transportation Needs: Not on file  Physical Activity: Not on file  Stress: Not on file  Social Connections: Not on file     Review of Systems Per HPI  Objective:  BP 123/80   Pulse 86   Temp 98.1 F (36.7 C)   Ht 6\' 1"  (1.854 m)   Wt 221 lb (100.2 kg)   SpO2 97%   BMI 29.16 kg/m      07/30/2021    1:49 PM 04/15/2021    3:32 PM 03/21/2021    9:28 AM  BP/Weight  Systolic BP 123 116 119  Diastolic BP 80 80 81  Wt. (Lbs) 221 244.2 251.4  BMI 29.16 kg/m2 32.22 kg/m2 33.17 kg/m2    Physical Exam Vitals and nursing note reviewed.  Constitutional:      Appearance: Normal appearance.  HENT:     Head: Normocephalic and atraumatic.  Cardiovascular:     Rate and Rhythm: Normal rate and regular rhythm.  Pulmonary:     Effort: Pulmonary effort is normal.     Breath sounds: Normal breath sounds. No wheezing, rhonchi or rales.  Neurological:     Mental Status: He is alert.  Psychiatric:        Mood and Affect: Mood normal.        Behavior: Behavior normal.     Lab Results  Component Value Date   WBC 7.1 11/01/2019  HGB 11.8 (L) 11/01/2019   HCT 36.2 (L) 11/01/2019   PLT 209 11/01/2019   GLUCOSE 115 (H) 11/01/2019   CHOL 199 08/19/2012   TRIG 211 (H) 08/19/2012   HDL 48 08/19/2012   LDLCALC 109 (H) 08/19/2012   ALT 44 10/30/2019   AST 34 10/30/2019   NA 142 11/01/2019   K 3.9 11/01/2019   CL 108 11/01/2019   CREATININE 1.53 (H) 11/01/2019   BUN 26 (H) 11/01/2019   CO2 25 11/01/2019   INR 0.97 12/24/2016   HGBA1C 5.4 10/29/2019     Assessment & Plan:   Problem List Items Addressed This Visit       Other   Low back pain    Rx for prednisone sent in case he develops a flare in the near future.  Low-dose Valium for spasm.  Patient is aware not to use this frequently or with machinery, swimming, scuba diving, etc.      Relevant Medications   predniSONE (DELTASONE) 10 MG tablet    Meds ordered this encounter  Medications   diazepam (VALIUM) 5 MG tablet    Sig: Take 1 tablet (5 mg total) by mouth every 12 (twelve) hours as needed for muscle spasms.    Dispense:  30 tablet     Refill:  1   predniSONE (DELTASONE) 10 MG tablet    Sig: 50 mg daily x 2 days, then 40 mg daily x 2 days, then 30 mg daily x 2 days, then 20 mg daily x 2 days, then 10 mg daily x 2 days.    Dispense:  30 tablet    Refill:  0    Sharra Cayabyab DO [facility]

## 2021-07-30 NOTE — Patient Instructions (Signed)
Keep up the good work.  Enjoy your trip.  Take care  Dr. Adriana Simas

## 2021-07-30 NOTE — Assessment & Plan Note (Signed)
Rx for prednisone sent in case he develops a flare in the near future.  Low-dose Valium for spasm.  Patient is aware not to use this frequently or with machinery, swimming, scuba diving, etc.

## 2021-07-31 ENCOUNTER — Other Ambulatory Visit: Payer: Self-pay | Admitting: *Deleted

## 2021-07-31 DIAGNOSIS — Z1211 Encounter for screening for malignant neoplasm of colon: Secondary | ICD-10-CM

## 2021-07-31 LAB — CMP14+EGFR
ALT: 22 IU/L (ref 0–44)
AST: 21 IU/L (ref 0–40)
Albumin/Globulin Ratio: 1.9 (ref 1.2–2.2)
Albumin: 4.7 g/dL (ref 3.8–4.9)
Alkaline Phosphatase: 100 IU/L (ref 44–121)
BUN/Creatinine Ratio: 15 (ref 9–20)
BUN: 11 mg/dL (ref 6–24)
Bilirubin Total: 0.5 mg/dL (ref 0.0–1.2)
CO2: 24 mmol/L (ref 20–29)
Calcium: 9.4 mg/dL (ref 8.7–10.2)
Chloride: 100 mmol/L (ref 96–106)
Creatinine, Ser: 0.74 mg/dL — ABNORMAL LOW (ref 0.76–1.27)
Globulin, Total: 2.5 g/dL (ref 1.5–4.5)
Glucose: 98 mg/dL (ref 70–99)
Potassium: 4.6 mmol/L (ref 3.5–5.2)
Sodium: 143 mmol/L (ref 134–144)
Total Protein: 7.2 g/dL (ref 6.0–8.5)
eGFR: 109 mL/min/{1.73_m2} (ref >59)

## 2021-07-31 LAB — HEMOGLOBIN A1C
Est. average glucose Bld gHb Est-mCnc: 91 mg/dL
Hgb A1c MFr Bld: 4.8 % (ref 4.8–5.6)

## 2021-07-31 LAB — CBC
Hematocrit: 42 % (ref 37.5–51.0)
Hemoglobin: 14.7 g/dL (ref 13.0–17.7)
MCH: 34.9 pg — ABNORMAL HIGH (ref 26.6–33.0)
MCHC: 35 g/dL (ref 31.5–35.7)
MCV: 100 fL — ABNORMAL HIGH (ref 79–97)
Platelets: 219 10*3/uL (ref 150–450)
RBC: 4.21 x10E6/uL (ref 4.14–5.80)
RDW: 14.4 % (ref 11.6–15.4)
WBC: 5.5 10*3/uL (ref 3.4–10.8)

## 2021-07-31 LAB — LIPID PANEL
Chol/HDL Ratio: 2 ratio (ref 0.0–5.0)
Cholesterol, Total: 188 mg/dL (ref 100–199)
HDL: 94 mg/dL (ref >39)
LDL Chol Calc (NIH): 84 mg/dL (ref 0–99)
Triglycerides: 53 mg/dL (ref 0–149)
VLDL Cholesterol Cal: 10 mg/dL (ref 5–40)

## 2021-08-06 ENCOUNTER — Encounter (INDEPENDENT_AMBULATORY_CARE_PROVIDER_SITE_OTHER): Payer: Self-pay | Admitting: *Deleted

## 2021-09-15 ENCOUNTER — Ambulatory Visit: Payer: BC Managed Care – PPO | Admitting: Family Medicine

## 2021-09-15 VITALS — BP 122/86 | HR 74 | Temp 98.4°F | Ht 73.0 in | Wt 221.0 lb

## 2021-09-15 DIAGNOSIS — H6983 Other specified disorders of Eustachian tube, bilateral: Secondary | ICD-10-CM

## 2021-09-15 MED ORDER — AZELASTINE HCL 0.1 % NA SOLN: 2.0000 | Freq: Two times a day (BID) | NASAL | 12 refills | Status: DC

## 2021-09-15 NOTE — Progress Notes (Signed)
   Subjective:    Patient ID: Trevor Rollins, male    DOB: January 29, 1971, 51 y.o.   MRN: [id number]  HPI Fluid in ears ,recent scuba diving and is scheduled for an upcoming diving trip  Patient with significant pressure in both ears that bothered him he wanted to know what to do to avoid ear problems with scuba diving denies any significant drainage  Review of Systems     Objective:   Physical Exam eardrums look normal  Lungs clear heart regular      Assessment & Plan:   Eustachian tube dysfunction may utilize Flonase and Astelin when necessary along with Claritin For any otitis externa recommend Ciprodex or similar medicine Patient will be having a scuba diving trip to [place] coming up next year If he needs a prescription for any of these medicines he will let us know We did discuss colonoscopy he states that is in the works with getting set up

## 2021-09-15 NOTE — Patient Instructions (Signed)
Hi Abdikadir It was good to see you today. Currently you do not have an ear infection  For swimmer's ear I recommend Cortisporin otic drops-when you get closer to June 2024 please notify myself or Dr. Adriana Simas and we can send the prescription into your pharmacy  As for short-term use nasal decongestant spray such as Afrin or generic 1 or 2 sprays in each nostril will typically last 12 hours and can be used for 3 to 4 days in a row  For allergies loratadine tablet 1 daily, and adding to it Astelin nasal spray 2 sprays each nostril twice daily-when consistently use will dramatically help with head congestion and nasal congestion and help keep the eustachian tube open.  Should you have further questions or problems please let us know  Thanks-Dr. Lorin Picket

## 2021-10-09 ENCOUNTER — Encounter: Payer: Self-pay | Admitting: Neurology

## 2021-10-09 ENCOUNTER — Ambulatory Visit: Payer: BC Managed Care – PPO | Admitting: Neurology

## 2021-10-09 VITALS — Ht 73.0 in | Wt 218.0 lb

## 2021-10-09 DIAGNOSIS — G4733 Obstructive sleep apnea (adult) (pediatric): Secondary | ICD-10-CM

## 2021-10-09 NOTE — Progress Notes (Signed)
SLEEP MEDICINE CLINIC    Provider:  Melvyn Novas, MD  Primary Care Physician:  Tommie Sams, DO [address] [place] [address]     Referring Provider: [facility] , Dr Wyline Mood.         Chief Complaint according to patient   Patient presents with:     New Patient (Initial Visit)           HISTORY OF PRESENT ILLNESS:  Trevor Rollins is a 51 - year- old Caucasian male patient is seen in RV on 10/09/2021 , The patient has been losing weight since last visit , when he peaked at 260 , and now he lost 35 pounds .  He is doing well, has a travel CPAP and main machine. Both synchronized to one compliance record. He is upset with adapt which he paid in cash and then was billed again. He o loves his CPAP, not even napping without it: He sleeps about 10.23 minutes on average.  Rec no- [id number].  The Compliance is 100% and averaging 7. 5 hours, with an AHI 0.4/h. The patient uses an AutoSet between 5 and 15 cm water with 3 cm expiratory pressure relief.  The pressure at the 95th percentile is 10.6 cm water well within his current settings and he has no central apneas arising.  There is minimal air leakage 95th percentile air leak was 4.8 L a minute.  This is excellent.  The patient had undergone a baseline polysomnogram on April 05, 2020 the results were quoted below because of strongly REM sleep dependent sleep apnea but it was also performed while he was still on 2 medications that could have except exacerbated his apnea one of the Valium.  120/80 mmHg.    Mr. Trevor Rollins underwent a sleep study- 04-05-2020, IMPRESSION:   1. This PSG found mild Obstructive Sleep Apnea (OSA) at an AHI of  7.7/h and REM AHI of 42.2/h., mild snoring, and severe Periodic  Limb Movement Disorder (PLMD).  2. 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  3. No prolonged hypoxemia was noted.   RECOMMENDATIONS:   1. CPAP titration study is recommended to optimize therapy.    2.  Plan B is autotitration CPAP device, nasal pillow or nasal  mask, 5-15 cm water, 3 cm EPR , short RAMP, heated humidity.  3. The patient needs to avoid supine sleep.  4. Dental device or inspire implant will not resolve a REM sleep  dependent forms of apnea.   10-03-2020: The patient presents now being initiated to CPAP therapy and he is using an autotitrator with a pressure setting between 5 and 15 cmH2O and 3 cm EPR he is 100% compliant by days and 90% compliant by hours, his average use at time is 7 hours 52 minutes at night.  His residual AHI is only 0.3 but shows an excellent resolution of apnea.  There are no central apneas arising.  The 95th percentile pressure is 11.8 cmH2O and is covered under his current settings.  He does have moderate high air leakage from his interface. Its when he sleeps on his side.   Mr. Trevor Rollins reports that he no longer needs to take a break from he has a longer car ride ahead of him, he is able to sleep in the same bedroom with his wife, he feels he feels significantly significantly more refreshed and restored in the morning.  His current Epworth sleepiness scale was endorsed at  0 points.a FSS at 20/ 63 points. He used to be in a bad mood in the morinng, now he is just fine and his family appreciated this.      Originally sent from [facility], Cardiology.   Chief concern according to patient :  The patient was hospitalized at [facility], Mr. Trevor Rollins was admitted with septic shock he had been presenting on 28 October 2019 finally to the hospital following 3 days of nausea, vomiting diarrhea and severe dehydration intense chills, was first seen in urgent care with a blood pressure of only 85/68 mmHg in the emergency room he was tachypneic his oxygen pressures were very low and he had lactic acidosis.  His creatinine had risen 201.47 his AST to 89 his ALT to 61.  Initial troponin of 208.  Covid test initially negative, chest x-ray showed cardiomegaly without edema and left lower  lobe atelectasis was noted.  He was placed on pressors and stress dose steroids along with fluids IV resuscitation.  Echocardiogram demonstrated an EF of 40% with global hypokinesis.  Repeat Covid testing was negative that time troponins troponins were elevated starting at 208-230 then 2 2268 and finally 1664.  Dr. 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  And he recommended an outpatient sleep study.   Repeat Echocardiogram was performed after chest pain in 01-2020. Echo showed recovery. He attributes the stress to his membership in the local School[employer].      Trevor Rollins  a right handed  Caucasian male who  has a past medical history of Allergy, Anxiety, Asthma, DDD (degenerative disc disease), Deafness in left ear, Eczema (03/21/2021), ED (erectile dysfunction), Hyperlipidemia, Hypertension, Hypertriglyceridemia, and Insomnia. Recent septic shock. CHF. He has chronic sciatica and is followed by neurosurgeon.  The patient also has deafness in the left ear, there has been a history of he has insomnia but only many years ago did he have insomnia related to chronic pain and this has been treated by a separate specialist surgeon was Dr. Odette Fraction he is a neurosurgery patient and had an implanted pulse generator.  Battery exchanged in 12-24-2016.  He had an oral ankle fracture in 2016 wisdom tooth were extracted.  His medication list was reviewed.  Echocardiogram was reviewed 01-25-20 by estimation back to 55% left ventricle normal function no regional wall motion abnormalities noted anymore.nteresting history of SEPSIS and CARDIOMYOPATHY , Covid negative -  2) patient has been taking prn opiates, and he is not sleeping well. He has a lot of twitching, snoring and EDS.  3) he has poor sleep quality but also needs more sleep to feel refreshed.       Family medical /sleep history: No other family member on CPAP with OSA, insomnia, sleep walkers.    Social  history:  Patient is working as Manufacturing engineer-  Therapist, [name]  and lives in a household with spouse and 78 year old daughter, and 34 year old Archivist.   Travelling again, walking steady.  One dog and one cat. Tobacco use; none .  ETOH use ; 2 a day. Caffeine intake in form of  1 coke in AM. Regular exercise is limited..    Sleep habits are as follows: The patient's dinner time is between 6-7 PM. The patient goes to bed at 11 PM and continues to sleep for 1-2  hours, wakes many times, sue to pain, due to sounds, snoring.   The preferred sleep position is left side  and supine , with the support of 1 pillow.  Dreams are reportedly vivid- lucid under medication.  7.30-9 AM is the usual rise time. The patient wakes up spontaneously unless he has meetings- he feels goes if he sleeps until 10 AM  He reports not feeling refreshed or restored in AM, with symptoms such as dry mouth, and residual fatigue.  Naps are taken frequently in PM , lasting from 60 minutes and are more refreshing than nocturnal sleep.    Review of Systems: Out of a complete 14 system review, the patient complains of only the following symptoms, and all other reviewed systems are negative.:  Fatigue, sleepiness , snoring, fragmented sleep- hypersomnia.    How likely are you to doze in the following situations: 0 = not likely, 1 = slight chance, 2 = moderate chance, 3 = high chance   Sitting and Reading? Watching Television? Sitting inactive in a public place (theater or meeting)? As a passenger in a car for an hour without a break? Lying down in the afternoon when circumstances permit? Sitting and talking to someone? Sitting quietly after lunch without alcohol? In a car, while stopped for a few minutes in traffic?   Total =now 0 from  9/ 24 points - reports some sleep attacks.   FSS endorsed at  12  from 20 from 41/ 63 points.  Significant improvement.   GDS/ 1/15.   Chronic back pain.  Walks with a cane.   Social History   Socioeconomic History   Marital status: Married    Spouse name: Not on file   Number of children: Not on file   Years of education: Not on file   Highest education level: Not on file  Occupational History   Not on file  Tobacco Use   Smoking status: Never   Smokeless tobacco: Never  Vaping Use   Vaping Use: Never used  Substance and Sexual Activity   Alcohol use: Yes    Comment: occasional   Drug use: No   Sexual activity: Not on file  Other Topics Concern   Not on file  Social History Narrative   Not on file   Social Determinants of Health   Financial Resource Strain: Not on file  Food Insecurity: Not on file  Transportation Needs: Not on file  Physical Activity: Not on file  Stress: Not on file  Social Connections: Not on file    Family History  Problem Relation Age of Onset   Cancer Sister    Stroke Other     Past Medical History:  Diagnosis Date   Allergy    Anxiety    Asthma    DDD (degenerative disc disease)    pt denies   Deafness in left ear    Eczema 03/21/2021   ED (erectile dysfunction)    Hyperlipidemia    Hypertension    Hypertriglyceridemia    Insomnia     Past Surgical History:  Procedure Laterality Date   ORIF ANKLE FRACTURE Right 03/01/2014   Procedure: OPEN REDUCTION INTERNAL FIXATION (ORIF) RIGHT ANKLE FRACTURE;  Surgeon: Cheral Almas, MD;  Location: [facility] OR;  Service: Orthopedics;  Laterality: Right;   SPINAL CORD STIMULATOR BATTERY EXCHANGE N/A 12/24/2016   Procedure: Replacement of implantable pulse generator,;  Surgeon: Odette Fraction, MD;  Location[facility];  Service: Neurosurgery;  Laterality: N/A;  Replacement of implantable pulse generator, possible replacement of leads on spinal cord stimulator   SPINAL CORD STIMULATOR IMPLANT     TONSILLECTOMY  WISDOM TOOTH EXTRACTION       Current Outpatient Medications on File Prior to Visit  Medication Sig Dispense Refill   amLODipine  (NORVASC) 10 MG tablet Take 1 tablet (10 mg total) by mouth daily. 90 tablet 3   azelastine (ASTELIN) 0.1 % nasal spray Place 2 sprays into both nostrils 2 (two) times daily. 30 mL 12   carvedilol (COREG) 3.125 MG tablet TAKE (1) TABLET TWICE DAILY. 180 tablet 3   citalopram (CELEXA) 20 MG tablet Take 1 tablet (20 mg total) by mouth daily. 90 tablet 3   diazepam (VALIUM) 5 MG tablet Take 1 tablet (5 mg total) by mouth every 12 (twelve) hours as needed for muscle spasms. (Patient not taking: Reported on 09/15/2021) 30 tablet 1   losartan (COZAAR) 50 MG tablet Take 1 tablet (50 mg total) by mouth daily. 90 tablet 3   predniSONE (DELTASONE) 10 MG tablet 50 mg daily x 2 days, then 40 mg daily x 2 days, then 30 mg daily x 2 days, then 20 mg daily x 2 days, then 10 mg daily x 2 days. (Patient not taking: Reported on 09/15/2021) 30 tablet 0   No current facility-administered medications on file prior to visit.    Allergies  Allergen Reactions   Penicillins Other (See Comments)    Childhood reaction "redness"  Has patient had a PCN reaction causing immediate rash, facial/tongue/throat swelling, SOB or lightheadedness with hypotension: No Has patient had a PCN reaction causing severe rash involving mucus membranes or skin necrosis: No Has patient had a PCN reaction that required hospitalization: No Has patient had a PCN reaction occurring within the last 10 years: No If all of the above answers are "NO", then may proceed with Cephalosporin use.     Physical exam:  Today's Vitals   10/09/21 1336  Weight: 218 lb (98.9 kg)  Height: 6\' 1"  (1.854 m)   Body mass index is 28.76 kg/m.   Wt Readings from Last 3 Encounters:  10/09/21 218 lb (98.9 kg)  09/15/21 221 lb (100.2 kg)  07/30/21 221 lb (100.2 kg)     Ht Readings from Last 3 Encounters:  10/09/21 6\' 1"  (1.854 m)  09/15/21 6\' 1"  (1.854 m)  07/30/21 6\' 1"  (1.854 m)      General: The patient is awake, alert and appears  in chronic  distress. The patient is well groomed. Head: Normocephalic, atraumatic.  Neck is supple. Mallampati 2,  neck circumference: 17.5  inches.  Nasal airflow congestion .  Retrognathia is mildly present . Dental status: intact.  There is a jaw tremor.  Cardiovascular:  Regular rate and cardiac rhythm by pulse, without distended neck veins. Respiratory: Lungs are clear to auscultation.  Skin:  Without evidence of ankle edema, or rash. Trunk: The patient's posture is seated - relaxed, and he uses a cane.    Neurologic exam : The patient is awake and alert, oriented to place and time.   Memory subjective described as intact.  Attention span & concentration ability appears normal.  Speech is fluent,  without dysarthria, dysphonia or aphasia.  Mood and affect are appropriate, he seems calm and happy.   Cranial nerves: no loss of smell or taste reported  Pupils are equal and briskly reactive to light. Funduscopic exam deferred.   Extraocular movements in vertical and horizontal planes were intact and without nystagmus. No Diplopia. Visual fields by finger perimetry are intact. Hearing was intact to soft voice and finger rubbing.    Facial  sensation intact to fine touch.  Facial motor strength is symmetric and tongue and uvula move midline.  Neck ROM : rotation, tilt and flexion extension were normal for age and shoulder shrug was symmetrical.    Motor exam:  Symmetric bulk, tone and ROM in upper extremities- I ma not  evluating gait and strength for the lower extremities. .   Normal tone without cog wheeling, symmetric grip strength .  Sensory:  Fine touch  and vibration were normal.  Proprioception tested in the upper extremities was normal.  Coordination: /Deep tendon reflexes: in the upper and lower extremities are symmetric and intact.  Babinski response was deferred .       After spending a total time of  25  minutes face to face and additional time for physical and neurologic  examination, review of laboratory studies,  personal review of imaging studies, reports and results of other testing and review of referral information / records as far as provided in visit, I have established the following assessments:  1) sleep apnea confirmed and now CPAP dependent, can't do well without it.    My Plan is to proceed with:  1) continue CPAP and see me in 12 month.    I would like to thank Dr Wyline MoodBranch, MD  for allowing me to meet with and to take care of this pleasant patient.   In short, Irean HongBrent H Raatz is doing very well on CPAP.   Electronically signed by: Melvyn Novasarmen Alexyss Balzarini, MD 10/09/2021 1:36 PM  [facility] and [facility] certified by The [employer] of Sleep Medicine and Diplomate of the [employer] of [employer]. Board certified In Neurology through the ABPN, Fellow of the [employer] of [employer]. Medical Director of [facility].

## 2021-10-09 NOTE — Patient Instructions (Addendum)
KEEPING IT CLEAN: CPAP HYGIENE PROPER UPKEEP OF YOUR CPAP MACHINE CAN HELP ENSURE THE DEVICE FUNCTIONS PROPERLY CPAP CLEANING INSTRUCTIONS Along with proper CPAP cleaning it is recommended that you replace your mask, tubing and filters once very 3 months and more frequently if you are sick.   DAILY CLEANING Do not use moisturizing soaps, bleach, scented oils, chlorine, or alcohol-based solutions to clean your supplies. These solutions may cause irritation to your skin and lungs and may reduce the life of your products. Dawn Dish soap or Comparable works best for daily cleaning.  **If you've been sick, it's smart to wash your mask, tubing, humidifier and filter daily until your cold, flu or virus symptoms are gone. That can help reduce the amount of time you spend under the weather.  Before using your mask -wash your face daily with soap and water to remove excess facial oils. Wipe down your mask (including areas that come in contact with your skin) using a damp towel with soap and warm water. This will remove any oils, dead skin cells, and sweat on the mask that can affect the quality of the seal. Gently rinse with a clean towel and let the mask air-dry out of direct sunlight. You can also use unscented baby wipes or pre-moistened towels designed specifically for cleaning CPAP masks, which are available on-line. DO NOT USE CLOROX OR DISINFECTING WIPES. If your unit has a humidifier, empty any leftover water instead of letting in sit in the unit all day. Refill the humidifier with clean, distilled water right before bedtime for optimal use WEEKLY (OR MORE FREQUENT) CLEANING Your mask and tubing need a full bath at least once a week to keep it free of dust, bacteria, and germs. (During COVID-19 or any other flu/virus we recommend more frequent cleaning) Clean the CPAP tubing, nasal mask, and headgear in a bathroom sink filled with warm water and a few drops of ammonia-free, mild dish detergent. Avoid  using stronger cleaning products, as they may damage the mask or leave harmful residue. Swirl all parts around for about five minutes, rinse well and let air dry during the day. Hang the tubing over the shower rod, on a towel rack or in the laundry room to ensure all the water drips out. The mask and headgear can be air-dried on a towel or hung on a hook or hanger. You should also wipe down your CPAP machine with a damp cloth. Ensure the unit is unplugged. The towel shouldn't be too damp or wet, as water could get into the machine. Clean the filter by removing it and rinsing it in warm tap water. Run it under the water and squeeze to make sure there is no dust. Then blot down the filter with a towel. Do not wash your machine's white filter, if one is present--those are disposable and should be replaced every two weeks. If you are recovering from being sick, we recommend changing the filter sooner. If your CPAP has a humidifier, that also needs to be cleaned weekly. Empty any remaining water and then wash the water chamber in the sink with warm soapy water. Rinse well and drain out as much of the water as possible. Let the chamber air-dry before placing it back into the CPAP unit. Every other week you should disinfect the humidifier. Do that by soaking it in a solution of one-part vinegar to five parts water for 30 minutes, thoroughly rinsing and then placing in your dishwasher's top rack for washing. And keep   it clean by using only distilled water to prevent mineral deposits that can build up and cause damage to your machine. IMPORTANT TIPS Make caring for your CPAP equipment part of your morning routine. Keep machine and accessories out of direct sunlight to avoid damaging them. Never use bleach to clean accessories. Place machine on a level surface and away from curtains that may interfere with the air intake. Keep track of when you should order replacement parts for your mask and accessories so that  you always get the most out of your CPAP. You can also sign up for Auto Supply by contacting our DME department at [email or web address] **The following are examples of soap that may be used: [employer] baby soap, Ivory soap (plain).  With a little upkeep, your CPAP can continue to help you breathe better for a long time. Just a few minutes a day can help keep your CPAP running efficiently for years to come.  = 

## 2022-01-08 ENCOUNTER — Ambulatory Visit: Payer: BC Managed Care – PPO | Admitting: Family Medicine

## 2022-01-08 VITALS — BP 124/72 | HR 77 | Temp 98.4°F | Wt 211.8 lb

## 2022-01-08 DIAGNOSIS — M545 Low back pain, unspecified: Secondary | ICD-10-CM

## 2022-01-08 DIAGNOSIS — G8929 Other chronic pain: Secondary | ICD-10-CM | POA: Diagnosis not present

## 2022-01-08 MED ORDER — PREDNISONE 10 MG PO TABS: ORAL_TABLET | ORAL | 0 refills | Status: DC

## 2022-01-08 MED ORDER — METHOCARBAMOL 750 MG PO TABS: 750.0000 mg | ORAL_TABLET | Freq: Three times a day (TID) | ORAL | 1 refills | Status: DC | PRN

## 2022-01-08 NOTE — Progress Notes (Signed)
Subjective:  Patient ID: Trevor Rollins, male    DOB: September 16, 1970  Age: 51 y.o. MRN: [id number]  CC: Chief Complaint  Patient presents with   Spasms    HPI:  51 year old male presents for evaluation of the above.  Patient has issues with chronic back pain.  Doing physical therapy at home.  He is getting ready to go on a trip next month.  Trips with lots of physical activity seem to worsen his back pain and resulted in pain and spasm.  He responds well to prednisone and muscle relaxants.  Would like these medications to be refilled so that he can use them during his trip so that he is able to function well and not have significant pain.  Patient Active Problem List   Diagnosis Date Noted   Low back pain 07/30/2021   History of cardiomyopathy 03/21/2021   Eczema 03/21/2021   OSA (obstructive sleep apnea) 04/17/2020   RSD (reflex sympathetic dystrophy) 08/09/2019   Primary hypertension 09/08/2012   Hyperlipidemia 09/08/2012   Generalized anxiety disorder 09/08/2012    Social Hx   Social History   Socioeconomic History   Marital status: Married    Spouse name: Not on file   Number of children: Not on file   Years of education: Not on file   Highest education level: Not on file  Occupational History   Not on file  Tobacco Use   Smoking status: Never   Smokeless tobacco: Never  Vaping Use   Vaping Use: Never used  Substance and Sexual Activity   Alcohol use: Yes    Comment: occasional   Drug use: No   Sexual activity: Not on file  Other Topics Concern   Not on file  Social History Narrative   Not on file   Social Determinants of Health   Financial Resource Strain: Not on file  Food Insecurity: Not on file  Transportation Needs: Not on file  Physical Activity: Not on file  Stress: Not on file  Social Connections: Not on file    Review of Systems Per HPI  Objective:  BP 124/72   Pulse 77   Temp 98.4 F (36.9 C) (Oral)   Wt 211 lb 12.8 oz (96.1 kg)   SpO2  99%   BMI 27.94 kg/m      01/08/2022   11:19 AM 10/09/2021    1:36 PM 09/15/2021    3:37 PM  BP/Weight  Systolic BP 124  154  Diastolic BP 72  86  Wt. (Lbs) 211.8 218 221  BMI 27.94 kg/m2 28.76 kg/m2 29.16 kg/m2    Physical Exam Vitals and nursing note reviewed.  Constitutional:      General: He is not in acute distress.    Appearance: Normal appearance.  HENT:     Head: Normocephalic and atraumatic.  Cardiovascular:     Rate and Rhythm: Normal rate and regular rhythm.  Pulmonary:     Effort: Pulmonary effort is normal.     Breath sounds: Normal breath sounds. No wheezing, rhonchi or rales.  Neurological:     Mental Status: He is alert.  Psychiatric:        Mood and Affect: Mood normal.        Behavior: Behavior normal.     Lab Results  Component Value Date   WBC 5.5 07/30/2021   HGB 14.7 07/30/2021   HCT 42.0 07/30/2021   PLT 219 07/30/2021   GLUCOSE 98 07/30/2021   CHOL 188 07/30/2021  TRIG 53 07/30/2021   HDL 94 07/30/2021   LDLCALC 84 07/30/2021   ALT 22 07/30/2021   AST 21 07/30/2021   NA 143 07/30/2021   K 4.6 07/30/2021   CL 100 07/30/2021   CREATININE 0.74 (L) 07/30/2021   BUN 11 07/30/2021   CO2 24 07/30/2021   INR 0.97 12/24/2016   HGBA1C 4.8 07/30/2021     Assessment & Plan:   Problem List Items Addressed This Visit       Other   Low back pain - Primary    Prescribed prednisone to be used prior to and during upcoming trip.  Methocarbamol as prescribed for spasm.      Relevant Medications   predniSONE (DELTASONE) 10 MG tablet   methocarbamol (ROBAXIN) 750 MG tablet    Meds ordered this encounter  Medications   predniSONE (DELTASONE) 10 MG tablet    Sig: 50 mg daily x 2 days, then 40 mg daily x 2 days, then 30 mg daily x 2 days, then 20 mg daily x 2 days, then 10 mg daily x 2 days.    Dispense:  30 tablet    Refill:  0   methocarbamol (ROBAXIN) 750 MG tablet    Sig: Take 1 tablet (750 mg total) by mouth every 8 (eight) hours as  needed for muscle spasms.    Dispense:  90 tablet    Refill:  1    Areta Terwilliger DO [facility]

## 2022-01-08 NOTE — Assessment & Plan Note (Signed)
Prescribed prednisone to be used prior to and during upcoming trip.  Methocarbamol as prescribed for spasm.

## 2022-01-08 NOTE — Patient Instructions (Signed)
Meds as directed.  Weight 8/7 was 221. Current weight 211.  Have a safe trip.  Dr. Adriana Simas

## 2022-02-20 ENCOUNTER — Ambulatory Visit: Payer: BC Managed Care – PPO | Admitting: Allergy & Immunology

## 2022-02-20 ENCOUNTER — Encounter: Payer: Self-pay | Admitting: Allergy & Immunology

## 2022-02-20 VITALS — BP 130/76 | HR 87 | Temp 98.6°F | Resp 16 | Ht 73.5 in | Wt 208.2 lb

## 2022-02-20 DIAGNOSIS — J4520 Mild intermittent asthma, uncomplicated: Secondary | ICD-10-CM | POA: Diagnosis not present

## 2022-02-20 DIAGNOSIS — G4733 Obstructive sleep apnea (adult) (pediatric): Secondary | ICD-10-CM

## 2022-02-20 DIAGNOSIS — J3089 Other allergic rhinitis: Secondary | ICD-10-CM | POA: Diagnosis not present

## 2022-02-20 MED ORDER — PREDNISONE 10 MG PO TABS: ORAL_TABLET | ORAL | 0 refills | Status: DC

## 2022-02-20 MED ORDER — EPINEPHRINE 0.3 MG/0.3ML IJ SOAJ: 0.3000 mg | Freq: Once | INTRAMUSCULAR | 2 refills | Status: AC

## 2022-02-20 NOTE — Patient Instructions (Addendum)
1. Mild persistent asthma, uncomplicated - We will do lung testing at the next visit. - Maybe the weight loss has helped with your breathing.   2. Seasonal and perennial allergic rhinitis - We will do allergy testing at the next available slot.  - We will see you NEXT FRIDAY for skin testing. - We do rush immunotherapy at the [facility] offices now, but we are hoping to get this done in [place] too.   3. Return in about 1 week (around 02/27/2022).    Please inform us of any Emergency Department visits, hospitalizations, or changes in symptoms. Call us before going to the ED for breathing or allergy symptoms since we might be able to fit you in for a sick visit. Feel free to contact us anytime with any questions, problems, or concerns.  It was a pleasure to talk to you today!  Websites that have reliable patient information: 1. American Academy of Asthma, Allergy, and Immunology: www.aaaai.org 2. Food Allergy Research and Education (FARE): foodallergy.org 3. Mothers of Asthmatics: http://www.asthmacommunitynetwork.org 4. American College of Allergy, Asthma, and Immunology: www.acaai.org   COVID-19 Vaccine Information can be found at: ShippingScam.co.uk For questions related to vaccine distribution or appointments, please email [email or web address] or call [phone].     "Like" [place] on Facebook and Instagram for our latest updates!        Make sure you are registered to vote! If you have moved or changed any of your contact information, you will need to get this updated before voting!  In some cases, you MAY be able to register to vote online: [email or web address]

## 2022-02-20 NOTE — Progress Notes (Signed)
FOLLOW UP  Date of Service/Encounter:  02/20/22   Assessment:   Seasonal and perennial allergic rhinitis (grasses, weeds, trees, molds, cat, dog, dust mite, cockroach) - previously on allergen immunotherapy on maintenance since Aug 2016  Interested in restarting allergen immunotherapy    Mild persistent asthma, uncomplicated  OSA - on CPAP    Plan/Recommendations:    1. Mild persistent asthma, uncomplicated - We will do lung testing at the next visit. - Maybe the weight loss has helped with your breathing.   2. Seasonal and perennial allergic rhinitis - We will do allergy testing at the next available slot.  - We will see you NEXT FRIDAY for skin testing. - We do rush immunotherapy at the [facility] [place] [facility] now, but we are hoping to get this done in [place] too.   3. Return in about 1 week (around 02/27/2022).    Subjective:   Trevor Rollins is a 52 y.o. male presenting today for follow up of  Chief Complaint  Patient presents with   Immunotherapy    Wants to start rush immunotherapy.    Other    Has trouble equalizing when Trevor Rollins dives     Trevor Rollins has a history of the following: Patient Active Problem List   Diagnosis Date Noted   Low back pain 07/30/2021   History of cardiomyopathy 03/21/2021   Eczema 03/21/2021   OSA (obstructive sleep apnea) 04/17/2020   RSD (reflex sympathetic dystrophy) 08/09/2019   Primary hypertension 09/08/2012   Hyperlipidemia 09/08/2012   Generalized anxiety disorder 09/08/2012    History obtained from: chart review and patient.  Trevor Rollins is a 52 y.o. male presenting for a follow up visit.  Trevor Rollins was last seen in March 2021.  At that time, we did not do lung testing.  We continue with Flovent 110 mcg 2 puffs twice daily and albuterol as needed.  For his allergic rhinitis, Trevor Rollins was on allergy shots and over-the-counter nasal steroids.  Since last visit, it seems that Trevor Rollins has stopped his allergy shots.  Trevor Rollins has been on allergy shots  since August 2016.   Asthma/Respiratory Symptom History: His breathing seems to be well controlled. Trevor Rollins does have  OSA and uses a CPAP machine. Trevor Rollins sees someone in [place] for this.  Trevor Rollins feels a lot better on the CPAP.  Trevor Rollins tells me that Trevor Rollins was just very cranky when Trevor Rollins was not getting enough sleep.  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  Trevor Rollins shares with me that Trevor Rollins was waking up 50 times per hour on his original sleep study.  Trevor Rollins has trouble euqlizing when Trevor Rollins scuba dives. They are going down on January 26th and it was recommended that Trevor Rollins take Sudafed.  This is a 66 in [place].  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  Allergic Rhinitis Symptom History: Trevor Rollins stopped the allergy shots. Trevor Rollins wants to get back on them.  Trevor Rollins is currently on Flonase. Trevor Rollins takes Benadryl periodically.  Trevor Rollins would like to restart allergy shots.  However, Trevor Rollins wants to do rush immunotherapy.  Trevor Rollins would prefer to do this in [place], but would be open to doing it in [place] if needed.  Is unclear when his last testing was, but it was clearly before Trevor Rollins started shots in 2016.  Trevor Rollins felt a lot better on shots and definitely wants to restart  them. Trevor Rollins has not had sinus infections in the intervening years since we saw Trevor Rollins.   Otherwise, there have been no changes to his past medical history, surgical history, family history, or social history.    Review of Systems  Constitutional: Negative.  Negative for fever, malaise/fatigue and weight loss.  HENT:  Positive for congestion and sinus pain. Negative for ear discharge and ear pain.        Positive for postnasal drip.  Eyes:  Negative for pain, discharge and redness.  Respiratory:  Negative for cough, sputum production, shortness of breath and wheezing.   Cardiovascular: Negative.  Negative for chest pain and palpitations.  Gastrointestinal:  Negative for abdominal pain and heartburn.  Skin:  Negative.  Negative for itching and rash.  Neurological:  Negative for dizziness and headaches.  Endo/Heme/Allergies:  Positive for environmental allergies. Does not bruise/bleed easily.       Objective:   Blood pressure 130/76, pulse 87, temperature 98.6 F (37 C), resp. rate 16, height 6' 1.5" (1.867 m), weight 208 lb 4 oz (94.5 kg), SpO2 97 %. Body mass index is 27.1 kg/m.    Physical Exam Constitutional:      Appearance: Normal appearance. Trevor Rollins is well-developed.     Comments: Trevor Rollins has lost a lot of weight since I last saw Trevor Rollins.  HENT:     Head: Normocephalic and atraumatic.     Right Ear: Tympanic membrane, ear canal and external ear normal.     Left Ear: Tympanic membrane, ear canal and external ear normal.     Nose: Mucosal edema and rhinorrhea present. No nasal deformity or septal deviation.     Right Sinus: No maxillary sinus tenderness or frontal sinus tenderness.     Left Sinus: No maxillary sinus tenderness or frontal sinus tenderness.     Mouth/Throat:     Mouth: Mucous membranes are not pale and not dry.     Pharynx: Uvula midline.  Eyes:     General:        Right eye: No discharge.        Left eye: No discharge.     Conjunctiva/sclera: Conjunctivae normal.     Right eye: Right conjunctiva is not injected. No chemosis.    Left eye: Left conjunctiva is not injected. No chemosis.    Pupils: Pupils are equal, round, and reactive to light.  Cardiovascular:     Rate and Rhythm: Normal rate and regular rhythm.     Heart sounds: Normal heart sounds.  Pulmonary:     Effort: Pulmonary effort is normal. No tachypnea, accessory muscle usage or respiratory distress.     Breath sounds: Normal breath sounds. No wheezing, rhonchi or rales.  Chest:     Chest wall: No tenderness.  Lymphadenopathy:     Cervical: No cervical adenopathy.  Skin:    Coloration: Skin is not pale.     Findings: No abrasion, erythema, petechiae or rash. Rash is not papular, urticarial or vesicular.   Neurological:     Mental Status: Trevor Rollins is alert.      Diagnostic studies: none        Trevor Marvel, MD  [facility]

## 2022-02-27 ENCOUNTER — Ambulatory Visit: Payer: BC Managed Care – PPO | Admitting: Allergy & Immunology

## 2022-02-27 ENCOUNTER — Encounter: Payer: Self-pay | Admitting: Allergy & Immunology

## 2022-02-27 VITALS — BP 112/80 | HR 82 | Temp 99.1°F | Resp 20

## 2022-02-27 DIAGNOSIS — J3089 Other allergic rhinitis: Secondary | ICD-10-CM | POA: Diagnosis not present

## 2022-02-27 DIAGNOSIS — J302 Other seasonal allergic rhinitis: Secondary | ICD-10-CM

## 2022-02-27 NOTE — Progress Notes (Signed)
FOLLOW UP  Date of Service/Encounter:  02/27/22   Assessment:   Seasonal and perennial allergic rhinitis (grasses, ragweed, weeds, trees, indoor molds, outdoor molds, dust mites, cat, dog, and cockroach)  Plan/Recommendations:   1. Mild persistent asthma, uncomplicated - We will do lung testing at the next visit. - Maybe the weight loss has helped with your breathing.   2. Seasonal and perennial allergic rhinitis - Testing today showed: grasses, ragweed, weeds, trees, indoor molds, outdoor molds, dust mites, cat, dog, and cockroach. - Copy of test results provided.  - Avoidance measures provided. - CPT codes for rush immunotherapy and consent form provided today.  - We will sign it when you come in for your rush appointment, but take some time to read this over. - Continue with: with all of your current medications  - Tentatively put Monday February 5th on your schedule, but I will call you to confirm later today. - I am just making sure we have the staffing in place for this.  - We will give you the pre-medication regimen once we confirm the date today.   3. Return in about 17 days (around 03/16/2022) for RUSH IMMUNOTHERAPY.   Subjective:   Trevor Rollins is a 52 y.o. male presenting today for follow up of  Chief Complaint  Patient presents with   Allergy Testing    Trevor Rollins has a history of the following: Patient Active Problem List   Diagnosis Date Noted   Low back pain 07/30/2021   History of cardiomyopathy 03/21/2021   Eczema 03/21/2021   OSA (obstructive sleep apnea) 04/17/2020   RSD (reflex sympathetic dystrophy) 08/09/2019   Primary hypertension 09/08/2012   Hyperlipidemia 09/08/2012   Generalized anxiety disorder 09/08/2012    History obtained from: chart review and patient.  Trevor Rollins is a 52 y.o. male presenting for skin testing.  He was last seen 1 week ago.  At that time, he was interested in restarting allergy shots.  He had been off of them since  2021.  Since that have been so long since his testing, we decided to retest him to see what we need to include.  Since last visit, he has done well.  He remains interested rushing immunotherapy.  He would like to have the CPT code so that he can check with his insurance regarding coverage.  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  He has been with Mondays, Thursdays, or Fridays.  He strongly prefers to do it here in [place].  Otherwise, there have been no changes to his past medical history, surgical history, family history, or social history.    Review of Systems  Constitutional: Negative.  Negative for fever, malaise/fatigue and weight loss.  HENT:  Positive for congestion. Negative for ear discharge, ear pain and sinus pain.        Positive for postnasal drip.  Eyes:  Negative for pain, discharge and redness.  Respiratory:  Negative for cough, sputum production, shortness of breath and wheezing.   Cardiovascular: Negative.  Negative for chest pain and palpitations.  Gastrointestinal:  Negative for abdominal pain, heartburn, nausea and vomiting.  Skin: Negative.  Negative for itching and rash.  Neurological:  Negative for dizziness and headaches.  Endo/Heme/Allergies:  Positive for environmental allergies. Does not bruise/bleed easily.       Objective:   Blood pressure 112/80, pulse 82, temperature 99.1 F (37.3 C), resp. rate 20, SpO2 96 %.  There is no height or weight on file to calculate BMI.    Physical exam deferred since this was a skin testing appointment only  Diagnostic studies:   Allergy Studies:     Airborne Adult Perc - 02/27/22 0907     Time Antigen Placed 2993    Allergen Manufacturer Lavella Hammock    Location Back    Number of Test 59    1. Control-Buffer 50% Glycerol Negative    2. Control-Histamine 1 mg/ml 2+    3. Albumin saline Negative    4. [place] 3+    5. [place] 3+    6. [place] 3+    7.  [place] Blue 3+    8. Meadow Fescue 4+    9. Perennial Rye 2+    10. Sweet Vernal 3+    11. Timothy 4+    12. Cocklebur Negative    13. Burweed Marshelder Negative    14. Ragweed, short 3+    15. Ragweed, Giant 2+    16. Plantain,  English 2+    17. Lamb's Quarters Negative    18. Sheep Sorrell Negative    19. Rough Pigweed Negative    20. Marsh Elder, Rough Negative    21. Mugwort, Common Negative    22. Ash mix Negative    23. Birch mix Negative    24. Beech American Negative    25. Box, Elder Negative    26. Cedar, red Negative    27. Cottonwood, [place] Negative    28. Elm mix Negative    29. Hickory 3+    30. Maple mix Negative    31. Oak, [place] mix Negative    32. Pecan Pollen 3+    33. Pine mix Negative    34. Sycamore Eastern Negative    35. [place], Black Pollen Negative    36. Alternaria alternata Negative    37. Cladosporium Herbarum Negative    38. Aspergillus mix Negative    39. Penicillium mix Negative    40. Bipolaris sorokiniana (Helminthosporium) Negative    41. Drechslera spicifera (Curvularia) Negative    42. Mucor plumbeus Negative    43. Fusarium moniliforme Negative    44. Aureobasidium pullulans (pullulara) Negative    45. Rhizopus oryzae Negative    46. Botrytis cinera Negative    47. Epicoccum nigrum Negative    48. Phoma betae Negative    49. Candida Albicans Negative    50. Trichophyton mentagrophytes Negative    51. Mite, D Farinae  5,000 AU/ml Negative    52. Mite, D Pteronyssinus  5,000 AU/ml 2+    53. Cat Hair 10,000 BAU/ml Negative    54.  Dog Epithelia Negative    55. Mixed Feathers Negative    56. Horse Epithelia Negative    57. Cockroach, German 3+    58. Mouse Negative    59. Tobacco Leaf Negative             Intradermal - 02/27/22 0933     Time Antigen Placed 1940    Allergen Manufacturer Lavella Hammock    Location Arm    Number of Test 8    Control Negative    Weed mix 4+    Mold 1 4+    Mold 2 4+    Mold 3 4+     Mold 4 4+    Cat 3+    Dog 4+             Allergy testing results were read  and interpreted by myself, documented by clinical staff.      Salvatore Marvel, MD  [facility] and [facility]

## 2022-02-27 NOTE — Patient Instructions (Addendum)
1. Mild persistent asthma, uncomplicated - We will do lung testing at the next visit. - Maybe the weight loss has helped with your breathing.   2. Seasonal and perennial allergic rhinitis - Testing today showed: grasses, ragweed, weeds, trees, indoor molds, outdoor molds, dust mites, cat, dog, and cockroach. - Copy of test results provided.  - Avoidance measures provided. - Continue with: with all of your current medications  - Tentatively put Monday February 5th on your schedule, but I will call you to confirm later today. - I am just making sure we have the staffing in place for this.  - We will give you the pre-medication regimen once we confirm the date today.   3. Return in about 17 days (around 03/16/2022) for [place].   Please inform us of any Emergency Department visits, hospitalizations, or changes in symptoms. Call us before going to the ED for breathing or allergy symptoms since we might be able to fit you in for a sick visit. Feel free to contact us anytime with any questions, problems, or concerns.  It was a pleasure to talk to you today!  Websites that have reliable patient information: 1. American Academy of Asthma, Allergy, and Immunology: www.aaaai.org 2. Food Allergy Research and Education (FARE): foodallergy.org 3. Mothers of Asthmatics: http://www.asthmacommunitynetwork.org 4. American College of Allergy, Asthma, and Immunology: www.acaai.org   COVID-19 Vaccine Information can be found at: ShippingScam.co.uk For questions related to vaccine distribution or appointments, please email [email or web address] or call [phone].     "Like" [place] on Facebook and Instagram for our latest updates!        Make sure you are registered to vote! If you have moved or changed any of your contact information, you will need to get this updated before voting!  In some cases, you MAY be able to register to  vote online: [email or web address]     Airborne Adult Perc - 02/27/22 0907     Time Antigen Placed 4132    Allergen Manufacturer Lavella Hammock    Location Back    Number of Test 59    1. Control-Buffer 50% Glycerol Negative    2. Control-Histamine 1 mg/ml 2+    3. Albumin saline Negative    4. [place] 3+    5. [place] 3+    6. [place] 3+    7. [place] Blue 3+    8. Meadow Fescue 4+    9. Perennial Rye 2+    10. Sweet Vernal 3+    11. Timothy 4+    12. Cocklebur Negative    13. Burweed Marshelder Negative    14. Ragweed, short 3+    15. Ragweed, Giant 2+    16. Plantain,  English 2+    17. Lamb's Quarters Negative    18. Sheep Sorrell Negative    19. Rough Pigweed Negative    20. Marsh Elder, Rough Negative    21. Mugwort, Common Negative    22. Ash mix Negative    23. Birch mix Negative    24. Beech American Negative    25. Box, Elder Negative    26. Cedar, red Negative    27. Cottonwood, [place] Negative    28. Elm mix Negative    29. Hickory 3+    30. Maple mix Negative    31. Oak, [place] mix Negative    32. Pecan Pollen 3+    33. Pine mix Negative    34. Sycamore Eastern Negative    35. Walnut, Black Pollen Negative  36. Alternaria alternata Negative    37. Cladosporium Herbarum Negative    38. Aspergillus mix Negative    39. Penicillium mix Negative    40. Bipolaris sorokiniana (Helminthosporium) Negative    41. Drechslera spicifera (Curvularia) Negative    42. Mucor plumbeus Negative    43. Fusarium moniliforme Negative    44. Aureobasidium pullulans (pullulara) Negative    45. Rhizopus oryzae Negative    46. Botrytis cinera Negative    47. Epicoccum nigrum Negative    48. Phoma betae Negative    49. Candida Albicans Negative    50. Trichophyton mentagrophytes Negative    51. Mite, D Farinae  5,000 AU/ml Negative    52. Mite, D Pteronyssinus  5,000 AU/ml 2+    53. Cat Hair 10,000 BAU/ml Negative    54.  Dog Epithelia Negative     55. Mixed Feathers Negative    56. Horse Epithelia Negative    57. Cockroach, German 3+    58. Mouse Negative    59. Tobacco Leaf Negative             Intradermal - 02/27/22 0933     Time Antigen Placed 1940    Allergen Manufacturer Lavella Hammock    Location Arm    Number of Test 8    Control Negative    Weed mix 4+    Mold 1 4+    Mold 2 4+    Mold 3 4+    Mold 4 4+    Cat 3+    Dog 4+

## 2022-03-03 NOTE — Addendum Note (Signed)
Addended by: Valentina Shaggy on: 03/03/2022 11:06 PM   Modules accepted: Orders

## 2022-03-03 NOTE — Progress Notes (Signed)
RUSH Immunotherapy for Allergy Injections:   -- Apolonio Schneiders: Patient is scheduled already for [facility] for Monday February 19th.   -- Marcie Bal: I wrote [facility] Rx already.   -- Clinical staff: Please ensure the following has been completed: - Premedication Instructions (provided below): mail or send via [place]: Send in Rx for prednisone and Singulair if not already taking - Insurance coverage-ensure patient has confirmed CPT 95180 x 8 hours is covered - Consent: verify consent has been signed - Exclusionary criteria: verify patient meets below criteria --------------- Age criteria: 36-65 yo ONLY  --------------- No recent illness --------------- No recent vaccines (within 72 hours),  --------------- No unstable asthma  (FEV1 >80% at last clinic visit) --------------- He needs to hold his beta blocker for two days before the procedure and the date of the procedure.  --------------- No history of severe cardiac disease  --------------- No history of severe autoimmune disease (lupus, RA) --------------- Not pregnant  ---------------------------------------------------------------------------- RUSH Pre-Medication Instructions for Patients  DAY BEFORE RUSH IMMUNOTHERAPY: Morning Medications:  Choose one: Zyrtec 10 mg, Claritin 10 mg, Xyzal 5 mg or Allegra 180 mg (over the counter) Pepcid 20 mg (over the counter)  Prednisone 40 mg in the morning Singulair (montelukast) 10 mg in the morning  Evening Medications:  Pepcid 20 mg (over the counter)    DAY OF RUSH IMMUNOTHERAPY: Morning Medications:  Choose one: Zyrtec 10 mg, Claritin 10 mg, Xyzal 5 mg or Allegra 180 mg (over the counter) Pepcid 20 mg (over the counter)  Prednisone 40 mg in the morning  Singulair (montelukast) 10 mg in the morning  Evening Medications:  Pepcid 20 mg (over the counter)    Bring your Epipen/AuviQ with you to the clinic! Please arrive no later than 8:15am for your rush appointment Make sure to bring  food (lunch, snacks, etc), and activities to keep you occupied for the day!   You cannot leave the clinic until the entire appointment is over (~8 hours).

## 2022-03-04 ENCOUNTER — Encounter: Payer: Self-pay | Admitting: Allergy & Immunology

## 2022-03-04 NOTE — Telephone Encounter (Signed)
Per provider - called patient - DOB verified - advised of provider notation.   Patient verbalized understanding, no questions.

## 2022-03-09 DIAGNOSIS — J3089 Other allergic rhinitis: Secondary | ICD-10-CM

## 2022-03-09 NOTE — Progress Notes (Signed)
Aeroallergen Immunotherapy  Ordering Provider: Dr. Salvatore Marvel  Patient Details Name: Trevor Rollins MRN: [id number] Date of Birth: [birth year]  Order 2 of 2  Vial Label: RW/Molds  0.3 ml (Volume)  1:20 Concentration -- Ragweed Mix 0.2 ml (Volume)  1:20 Concentration -- Alternaria alternata 0.2 ml (Volume)  1:20 Concentration -- Cladosporium herbarum 0.2 ml (Volume)  1:10 Concentration -- Aspergillus mix 0.2 ml (Volume)  1:10 Concentration -- Penicillium mix 0.2 ml (Volume)  1:20 Concentration -- Bipolaris sorokiniana 0.2 ml (Volume)  1:20 Concentration -- Drechslera spicifera 0.2 ml (Volume)  1:10 Concentration -- Mucor plumbeus 0.2 ml (Volume)  1:10 Concentration -- Fusarium moniliforme 0.2 ml (Volume)  1:40 Concentration -- Aureobasidium pullulans 0.2 ml (Volume)  1:10 Concentration -- Rhizopus oryzae   2.3  ml Extract Subtotal 2.7  ml Diluent 5.0  ml Maintenance Total  Schedule:  RUSH Silver Vial (1:1,000,000): RUSH Blue Vial (1:100,000): RUSH Yellow Vial (1:10,000): RUSH Green Vial (1:1,000): B Red Vial (1:100): Schedule A (14 doses)   Special Instructions: RUSH, B Schedule for Trevor Rollins, and then A Schedule for Red Vial.

## 2022-03-09 NOTE — Progress Notes (Signed)
Aeroallergen Immunotherapy  Ordering Provider: Dr. Salvatore Marvel  Patient Details Name: [name] MRN: [id number] Date of Birth: [birth year]  Order 1 of 2  Vial Label: G/W/T/DM/C/D  0.3 ml (Volume)  BAU Concentration -- 7 Grass Mix* 100,000 ([address] [place], [place], [place], [place] Rye, RedTop, Sweet Vernal, Timothy) 0.2 ml (Volume)  1:20 Concentration -- [place] 0.3 ml (Volume)  BAU Concentration -- [place] 10,000 0.2 ml (Volume)  1:20 Concentration -- Johnson 0.2 ml (Volume)  1:10 Concentration -- Plantain English 0.5 ml (Volume)  1:20 Concentration -- Weed Mix* 0.2 ml (Volume)  1:10 Concentration -- Hickory* 0.2 ml (Volume)  1:10 Concentration -- Pecan Pollen 0.5 ml (Volume)  1:10 Concentration -- Cat Hair 0.5 ml (Volume)  1:10 Concentration -- Dog Epithelia 0.5 ml (Volume)   AU Concentration -- Mite Mix (DF 5,000 & DP 5,000)  3.6  ml Extract Subtotal 1.4  ml Diluent 5.0  ml Maintenance Total  Schedule:  RUSH Silver Vial (1:1,000,000): RUSH Blue Vial (1:100,000): RUSH Yellow Vial (1:10,000): RUSH Green Vial (1:1,000): B Red Vial (1:100): Schedule A (14 doses)   Special Instructions: RUSH, B Schedule for Allena Katz, and then A Schedule for Red Vial.

## 2022-03-09 NOTE — Progress Notes (Signed)
VIALS EXP 03-10-23

## 2022-03-10 DIAGNOSIS — J301 Allergic rhinitis due to pollen: Secondary | ICD-10-CM

## 2022-03-10 MED ORDER — FAMOTIDINE 20 MG PO TABS: ORAL_TABLET | ORAL | 0 refills | Status: DC

## 2022-03-10 MED ORDER — MONTELUKAST SODIUM 10 MG PO TABS: ORAL_TABLET | ORAL | 0 refills | Status: DC

## 2022-03-10 MED ORDER — PREDNISONE 10 MG PO TABS: ORAL_TABLET | ORAL | 0 refills | Status: DC

## 2022-03-10 NOTE — Addendum Note (Signed)
Addended by: Valentina Shaggy on: 03/10/2022 05:48 PM   Modules accepted: Orders

## 2022-03-16 ENCOUNTER — Ambulatory Visit: Payer: BC Managed Care – PPO | Admitting: Allergy & Immunology

## 2022-03-18 ENCOUNTER — Telehealth (INDEPENDENT_AMBULATORY_CARE_PROVIDER_SITE_OTHER): Payer: BC Managed Care – PPO | Admitting: Allergy & Immunology

## 2022-03-18 DIAGNOSIS — J4520 Mild intermittent asthma, uncomplicated: Secondary | ICD-10-CM

## 2022-03-18 NOTE — Telephone Encounter (Signed)
I called his wife. He has not used his albuterol in ages. His last script was September 2021. He has not used his albuterol since September 2021 when he had COVID. He has never had a diagnosis of asthma persay. I think we can just do a spiro when he comes in for the rush appointment instead.  I also instructed her to have him hold his carvedilol for 24 to 48 hours before his appointment.  He apparently has MyChart but never checks it, which is why he never responded to her MyChart messages.  I also am going to email her the message that I sent regarding his premedication regimen.  He did pick up some medications from the pharmacy, so these are probably been brought in already.  I emailed the instructions to [email or web address].  Salvatore Marvel, MD [facility] [place]

## 2022-03-18 NOTE — Telephone Encounter (Signed)
I called patient to confirm that he had gotten our MyChart.  Left voicemail asking him to call us about getting a spirometry.

## 2022-03-19 NOTE — Telephone Encounter (Signed)
I will talk to Dr. Simona Huh to make sure I am billing this appropriately.

## 2022-03-19 NOTE — Telephone Encounter (Signed)
Patient returned call - DOB verified - advised him of provider notation below.  Patient stated he has also contacted his insurance - he was advised to make sure the [facility] office visit is put in as a Nurse visit not Office visit - so insurance covers the visit.  Patient verbalized understanding - advised message would be forwarded to provider regarding billing coding for his Rush visit.

## 2022-03-20 NOTE — Telephone Encounter (Signed)
Yes that is what I do.

## 2022-03-25 ENCOUNTER — Other Ambulatory Visit: Payer: Self-pay

## 2022-03-25 DIAGNOSIS — J4520 Mild intermittent asthma, uncomplicated: Secondary | ICD-10-CM

## 2022-03-25 NOTE — Telephone Encounter (Signed)
He was able to come to do a spirometry.  His FEV1 was 3.52 L (84%) and his FVC was 4.35 L (80%).  His FEV1 to FVC ratio was 81%.

## 2022-03-30 ENCOUNTER — Encounter: Payer: Self-pay | Admitting: Allergy & Immunology

## 2022-03-30 ENCOUNTER — Other Ambulatory Visit: Payer: Self-pay | Admitting: Family Medicine

## 2022-03-30 ENCOUNTER — Other Ambulatory Visit: Payer: Self-pay | Admitting: Cardiology

## 2022-03-30 ENCOUNTER — Ambulatory Visit: Payer: BC Managed Care – PPO | Admitting: Allergy & Immunology

## 2022-03-30 ENCOUNTER — Other Ambulatory Visit: Payer: Self-pay

## 2022-03-30 VITALS — BP 124/76 | HR 82 | Temp 98.7°F | Resp 16

## 2022-03-30 DIAGNOSIS — J302 Other seasonal allergic rhinitis: Secondary | ICD-10-CM | POA: Diagnosis not present

## 2022-03-30 DIAGNOSIS — J3089 Other allergic rhinitis: Secondary | ICD-10-CM | POA: Diagnosis not present

## 2022-03-30 DIAGNOSIS — J4520 Mild intermittent asthma, uncomplicated: Secondary | ICD-10-CM

## 2022-03-30 NOTE — Progress Notes (Signed)
RAPID DESENSITIZATION Note  RE: Trevor Rollins MRN: [id number] DOB: [birth year] Date of Office Visit: 03/30/2022  Subjective:  Patient presents today for rapid desensitization.  Interval History: Patient has not been ill, he has taken all premedications as per protocol.  Recent/Current History: Pulmonary disease: no Cardiac disease: no (he did hold his beta blocker) Respiratory infection: no Rash: no Itch: no Swelling: no Cough: no Shortness of breath: no Runny/stuffy nose: no Itchy eyes: no Beta-blocker use: no (held for visit today)  Patient/guardian was informed of the procedure with verbalized understanding of the risk of anaphylaxis. Consent has been signed.   Medication List:  Current Outpatient Medications  Medication Sig Dispense Refill   amLODipine (NORVASC) 10 MG tablet Take 1 tablet (10 mg total) by mouth daily. 90 tablet 3   azelastine (ASTELIN) 0.1 % nasal spray Place 2 sprays into both nostrils 2 (two) times daily. 30 mL 12   carvedilol (COREG) 3.125 MG tablet TAKE (1) TABLET TWICE DAILY. 180 tablet 3   citalopram (CELEXA) 20 MG tablet Take 1 tablet (20 mg total) by mouth daily. 90 tablet 3   diazepam (VALIUM) 5 MG tablet Take 5 mg by mouth 2 (two) times daily as needed.     EPINEPHrine 0.3 mg/0.3 mL IJ SOAJ injection SMARTSIG:Milliliter(s) IM     famotidine (PEPCID) 20 MG tablet Take two tablets on the day before your appointment and two tablets on the day of your appointment. 5 tablet 0   fluticasone (FLONASE) 50 MCG/ACT nasal spray Administer 2 sprays in each nostril daily.     halobetasol (ULTRAVATE) 0.05 % cream Apply topically.     losartan (COZAAR) 50 MG tablet Take 1 tablet (50 mg total) by mouth daily. 90 tablet 3   methocarbamol (ROBAXIN) 750 MG tablet Take 1 tablet (750 mg total) by mouth every 8 (eight) hours as needed for muscle spasms. 90 tablet 1   montelukast (SINGULAIR) 10 MG tablet Take one tablet on the daily before your appointment and one  tablet on the day of your appointment. 5 tablet 0   predniSONE (DELTASONE) 10 MG tablet Take four tablets on the day before your appointment and four tablets on the day of your appointment. 8 tablet 0   No current facility-administered medications for this visit.   Allergies: Allergies  Allergen Reactions   Penicillins Other (See Comments)    Childhood reaction "redness"  Has patient had a PCN reaction causing immediate rash, facial/tongue/throat swelling, SOB or lightheadedness with hypotension: No Has patient had a PCN reaction causing severe rash involving mucus membranes or skin necrosis: No Has patient had a PCN reaction that required hospitalization: No Has patient had a PCN reaction occurring within the last 10 years: No If all of the above answers are "NO", then may proceed with Cephalosporin use.    I reviewed his past medical history, social history, family history, and environmental history and no significant changes have been reported from his previous visit.  ROS: Negative except as per HPI.  Objective: BP 132/72 (BP Location: Right Arm, Patient Position: Sitting, Cuff Size: Normal)   Pulse 96   Temp 98.7 F (37.1 C) (Temporal)   Resp 17   SpO2 97%  There is no height or weight on file to calculate BMI.   General Appearance:  Alert, cooperative, no distress, appears stated age. Cane in place due to chronic back pain.  Head:  Normocephalic, without obvious abnormality, atraumatic  Eyes:  Conjunctiva clear, EOM's intact  Nose:  Nares normal  Throat: Lips, tongue normal; teeth and gums normal, normal appearing posterior oropharnyx  Neck: Supple, symmetrical  Lungs:   CTAB, Respirations unlabored, no coughing  Heart:  Appears well perfused  Extremities: No edema  Skin: Skin color, texture, turgor normal, no rashes or lesions on visualized portions of skin  Neurologic: No gross deficits     Diagnostics: Spirometry:  Tracings reviewed. His effort: Good  reproducible efforts.   FVC: 4.35L FEV1: 3.52L, 84% predicted FEV1/FVC ratio: 81% Interpretation: No overt abnormalities noted given today's efforts.  Please see scanned spirometry results for details.  PROCEDURES:  Patient received the following doses every hour: Step 1:  0.34m - 1:1,000,000 dilution (silver vial) Step 2:  0.328m- 1:1,000,000 dilution (silver vial) Step 3: 0.73m52m 1:100,000 dilution (blue vial)  Step 4: 0.3ml773m1:100,000 dilution (blue vial)  Step 5: 0.73ml 73m:10,000 dilution (gold vial) Step 6: 0.2ml -74m10,000 dilution (gold vial) Step 7: 0.3ml - 573m0,000 dilution (gold vial) Step 8: 0.4ml - 17m,000 dilution (gold vial)  Patient was observed for 1 hour after the last dose.   Procedure started at 08:40 Procedure ended at 15:36   ASSESSMENT/PLAN:   Patient has tolerated the rapid desensitization protocol.  Next appointment: Start at 0.05ml of 78m00 dilution (green vial) and build up per protocol.   Erikka Follmer GallSalvatore Marvel[facility]

## 2022-03-30 NOTE — Patient Instructions (Addendum)
1. Mild persistent asthma, uncomplicated - Lung testing looked great.  - Maybe the weight loss has helped with your breathing.   2. Seasonal and perennial allergic rhinitis - Previous testing showed: grasses, ragweed, weeds, trees, indoor molds, outdoor molds, dust mites, cat, dog, and cockroach. - Copy of test results provided.  - Avoidance measures provided. - Continue with: with all of your current medications  - You tolerated the rush immunotherapy VERY well!  - Call us with any problems. - We will start on your GREEN VIAL when you come in next time.  - OK to come twice weekly, if desired (Mon, Wed, or Fri). - Call me with problems. - My work cell is [phone].  3. Return in three months or earlier if needed.   Please inform us of any Emergency Department visits, hospitalizations, or changes in symptoms. Call us before going to the ED for breathing or allergy symptoms since we might be able to fit you in for a sick visit. Feel free to contact us anytime with any questions, problems, or concerns.  It was a pleasure to talk to you today!  Websites that have reliable patient information: 1. American Academy of Asthma, Allergy, and Immunology: www.aaaai.org 2. Food Allergy Research and Education (FARE): foodallergy.org 3. Mothers of Asthmatics: http://www.asthmacommunitynetwork.org 4. American College of Allergy, Asthma, and Immunology: www.acaai.org   COVID-19 Vaccine Information can be found at: ShippingScam.co.uk For questions related to vaccine distribution or appointments, please email [email or web address] or call [phone] [phone].     "Like" [place] on Facebook and Instagram for our latest updates!        Make sure you are registered to vote! If you have moved or changed any of your contact information, you will need to get this updated before voting!  In some cases, you MAY be able to register to vote online:  [email or web address]

## 2022-04-08 ENCOUNTER — Ambulatory Visit (INDEPENDENT_AMBULATORY_CARE_PROVIDER_SITE_OTHER): Payer: BC Managed Care – PPO

## 2022-04-08 DIAGNOSIS — J309 Allergic rhinitis, unspecified: Secondary | ICD-10-CM

## 2022-04-09 ENCOUNTER — Encounter: Payer: Self-pay | Admitting: Radiology

## 2022-04-09 ENCOUNTER — Ambulatory Visit (INDEPENDENT_AMBULATORY_CARE_PROVIDER_SITE_OTHER): Payer: BC Managed Care – PPO | Admitting: Family Medicine

## 2022-04-09 VITALS — BP 119/81 | HR 84 | Temp 98.2°F | Ht 73.5 in | Wt 211.0 lb

## 2022-04-09 DIAGNOSIS — G8929 Other chronic pain: Secondary | ICD-10-CM | POA: Diagnosis not present

## 2022-04-09 DIAGNOSIS — M545 Low back pain, unspecified: Secondary | ICD-10-CM

## 2022-04-09 MED ORDER — METHOCARBAMOL 750 MG PO TABS: 750.0000 mg | ORAL_TABLET | Freq: Three times a day (TID) | ORAL | 3 refills | Status: DC | PRN

## 2022-04-09 NOTE — Patient Instructions (Signed)
Letter provided.  Medication refilled.  Referral placed for your daughter.  Take care  Dr. Lacinda Axon

## 2022-04-09 NOTE — Assessment & Plan Note (Signed)
Chronic.  Methocarbamol refilled.  Continue regular exercises.  Letter given for jury duty.

## 2022-04-09 NOTE — Progress Notes (Signed)
Subjective:  Patient ID: Trevor Rollins, male    DOB: [birth year]  Age: 52 y.o. MRN: [id number]  CC: Chief Complaint  Patient presents with   Back Pain    Jury duty excuse letter needed refill medication    HPI:  52 year old male presents for evaluation of the above.  Patient suffers from chronic low back pain and reflex sympathetic dystrophy.  He states his back pain has been doing fairly well recently.  He states that he needs a refill on his methocarbamol.  Patient has been selected for jury duty.  He does not feel that he can sit and tolerate jury duty due to his issues particular with his back.  He is in need of a letter regarding this.  Patient Active Problem List   Diagnosis Date Noted   Low back pain 07/30/2021   History of cardiomyopathy 03/21/2021   Eczema 03/21/2021   OSA (obstructive sleep apnea) 04/17/2020   RSD (reflex sympathetic dystrophy) 08/09/2019   Primary hypertension 09/08/2012   Hyperlipidemia 09/08/2012   Generalized anxiety disorder 09/08/2012    Social Hx   Social History   Socioeconomic History   Marital status: Married    Spouse name: Not on file   Number of children: Not on file   Years of education: Not on file   Highest education level: Not on file  Occupational History   Not on file  Tobacco Use   Smoking status: Never   Smokeless tobacco: Never  Vaping Use   Vaping Use: Never used  Substance and Sexual Activity   Alcohol use: Yes    Comment: occasional   Drug use: No   Sexual activity: Not on file  Other Topics Concern   Not on file  Social History Narrative   Not on file   Social Determinants of Health   Financial Resource Strain: Not on file  Food Insecurity: Not on file  Transportation Needs: Not on file  Physical Activity: Not on file  Stress: Not on file  Social Connections: Not on file    Review of Systems Per HPI  Objective:  BP 119/81   Pulse 84   Temp 98.2 F (36.8 C)   Ht 6' 1.5" (1.867 m)   Wt 211 lb  (95.7 kg)   SpO2 96%   BMI 27.46 kg/m      04/09/2022    2:05 PM 03/30/2022    3:09 PM 03/30/2022    8:52 AM  BP/Weight  Systolic BP [id number]  Diastolic BP 81 76 72  Wt. (Lbs) 211    BMI 27.46 kg/m2      Physical Exam Vitals and nursing note reviewed.  Constitutional:      Appearance: Normal appearance.  Cardiovascular:     Rate and Rhythm: Normal rate and regular rhythm.  Pulmonary:     Effort: Pulmonary effort is normal.     Breath sounds: Normal breath sounds. No wheezing or rhonchi.  Neurological:     Mental Status: He is alert.  Psychiatric:        Mood and Affect: Mood normal.        Behavior: Behavior normal.     Lab Results  Component Value Date   WBC 5.5 07/30/2021   HGB 14.7 07/30/2021   HCT 42.0 07/30/2021   PLT 219 07/30/2021   GLUCOSE 98 07/30/2021   CHOL 188 07/30/2021   TRIG 53 07/30/2021   HDL 94 07/30/2021   LDLCALC 84 07/30/2021   ALT  22 07/30/2021   AST 21 07/30/2021   NA 143 07/30/2021   K 4.6 07/30/2021   CL 100 07/30/2021   CREATININE 0.74 (L) 07/30/2021   BUN 11 07/30/2021   CO2 24 07/30/2021   INR 0.97 12/24/2016   HGBA1C 4.8 07/30/2021     Assessment & Plan:   Problem List Items Addressed This Visit       Other   Low back pain - Primary    Chronic.  Methocarbamol refilled.  Continue regular exercises.  Letter given for jury duty.      Relevant Medications   methocarbamol (ROBAXIN) 750 MG tablet    Meds ordered this encounter  Medications   methocarbamol (ROBAXIN) 750 MG tablet    Sig: Take 1 tablet (750 mg total) by mouth every 8 (eight) hours as needed for muscle spasms.    Dispense:  90 tablet    Refill:  [place]

## 2022-04-10 ENCOUNTER — Ambulatory Visit (INDEPENDENT_AMBULATORY_CARE_PROVIDER_SITE_OTHER): Payer: BC Managed Care – PPO

## 2022-04-10 ENCOUNTER — Ambulatory Visit: Payer: BC Managed Care – PPO | Admitting: Family Medicine

## 2022-04-10 ENCOUNTER — Other Ambulatory Visit: Payer: Self-pay | Admitting: Family Medicine

## 2022-04-10 ENCOUNTER — Other Ambulatory Visit: Payer: Self-pay | Admitting: Cardiology

## 2022-04-10 DIAGNOSIS — J309 Allergic rhinitis, unspecified: Secondary | ICD-10-CM

## 2022-04-15 ENCOUNTER — Ambulatory Visit (INDEPENDENT_AMBULATORY_CARE_PROVIDER_SITE_OTHER): Payer: BC Managed Care – PPO

## 2022-04-15 DIAGNOSIS — J309 Allergic rhinitis, unspecified: Secondary | ICD-10-CM | POA: Diagnosis not present

## 2022-04-17 ENCOUNTER — Ambulatory Visit (INDEPENDENT_AMBULATORY_CARE_PROVIDER_SITE_OTHER): Payer: BC Managed Care – PPO

## 2022-04-17 DIAGNOSIS — J309 Allergic rhinitis, unspecified: Secondary | ICD-10-CM | POA: Diagnosis not present

## 2022-04-20 ENCOUNTER — Ambulatory Visit: Payer: BC Managed Care – PPO | Attending: Cardiology | Admitting: Cardiology

## 2022-04-20 ENCOUNTER — Encounter: Payer: Self-pay | Admitting: Cardiology

## 2022-04-20 VITALS — BP 130/78 | HR 77 | Ht 73.0 in | Wt 209.2 lb

## 2022-04-20 DIAGNOSIS — Z8679 Personal history of other diseases of the circulatory system: Secondary | ICD-10-CM | POA: Diagnosis not present

## 2022-04-20 DIAGNOSIS — I10 Essential (primary) hypertension: Secondary | ICD-10-CM

## 2022-04-20 MED ORDER — CARVEDILOL 3.125 MG PO TABS: 3.1250 mg | ORAL_TABLET | Freq: Two times a day (BID) | ORAL | 11 refills | Status: DC

## 2022-04-20 MED ORDER — LOSARTAN POTASSIUM 50 MG PO TABS: 50.0000 mg | ORAL_TABLET | Freq: Every day | ORAL | 11 refills | Status: DC

## 2022-04-20 MED ORDER — AMLODIPINE BESYLATE 10 MG PO TABS: 10.0000 mg | ORAL_TABLET | Freq: Every day | ORAL | 11 refills | Status: DC

## 2022-04-20 NOTE — Progress Notes (Signed)
Clinical Summary Mr. Trevor Rollins is a 52 y.o.male seen today for follow up of the following medical problems.          1.History of cardiomyopathy -10/2019 echo LVEF 40-45% by echo.Mod RV dysfunction. LV dysfunction in setting of septic shock, possibly stress induced CM given it resolved with f/u echo. Elevated trop at the time, with resolution of systolic dysfunction have not pursued ishcemic testing.  - 01/2020 echo: 55-60%. Normal RV - compliant with meds  - no SOB/DOE, no LE edema - compliant with meds     2. OSA - on cpap - followed by Dr Brett Fairy - doing well on cpap     3. HTN - he is compliant    4. Weight loss - has made significant dietary changes, weight 244 last year to 209 lbs today.   Enjoys scuba diving, upcoming trip to [place].    Past Medical History:  Diagnosis Date   Allergy    Anxiety    Asthma    DDD (degenerative disc disease)    pt denies   Deafness in left ear    Eczema 03/21/2021   ED (erectile dysfunction)    Hyperlipidemia    Hypertension    Hypertriglyceridemia    Insomnia      Allergies  Allergen Reactions   Penicillins Other (See Comments)    Childhood reaction "redness"  Has patient had a PCN reaction causing immediate rash, facial/tongue/throat swelling, SOB or lightheadedness with hypotension: No Has patient had a PCN reaction causing severe rash involving mucus membranes or skin necrosis: No Has patient had a PCN reaction that required hospitalization: No Has patient had a PCN reaction occurring within the last 10 years: No If all of the above answers are "NO", then may proceed with Cephalosporin use.      Current Outpatient Medications  Medication Sig Dispense Refill   amLODipine (NORVASC) 10 MG tablet TAKE 1 TABLET BY MOUTH ONCE DAILY. 15 tablet 0   azelastine (ASTELIN) 0.1 % nasal spray Place 2 sprays into both nostrils 2 (two) times daily. 30 mL 12   carvedilol (COREG) 3.125 MG tablet TAKE (1) TABLET TWICE DAILY.  60 tablet 0   citalopram (CELEXA) 20 MG tablet TAKE 1 TABLET ONCE DAILY. 90 tablet 1   diazepam (VALIUM) 5 MG tablet Take 5 mg by mouth 2 (two) times daily as needed.     EPINEPHrine 0.3 mg/0.3 mL IJ SOAJ injection SMARTSIG:Milliliter(s) IM     fluticasone (FLONASE) 50 MCG/ACT nasal spray Administer 2 sprays in each nostril daily.     halobetasol (ULTRAVATE) 0.05 % cream Apply topically.     losartan (COZAAR) 50 MG tablet TAKE 1 TABLET BY MOUTH ONCE DAILY. 15 tablet 0   methocarbamol (ROBAXIN) 750 MG tablet Take 1 tablet (750 mg total) by mouth every 8 (eight) hours as needed for muscle spasms. 90 tablet 3   No current facility-administered medications for this visit.     Past Surgical History:  Procedure Laterality Date   ORIF ANKLE FRACTURE Right 03/01/2014   Procedure: OPEN REDUCTION INTERNAL FIXATION (ORIF) RIGHT ANKLE FRACTURE;  Surgeon: Marianna Payment, MD;  Location: [place];  Service: Orthopedics;  Laterality: Right;   SPINAL CORD STIMULATOR BATTERY EXCHANGE N/A 12/24/2016   Procedure: Replacement of implantable pulse generator,;  Surgeon: Clydell Hakim, MD;  Location: [place];  Service: Neurosurgery;  Laterality: N/A;  Replacement of implantable pulse generator, possible replacement of leads on spinal cord stimulator   SPINAL  CORD STIMULATOR IMPLANT     TONSILLECTOMY     WISDOM TOOTH EXTRACTION       Allergies  Allergen Reactions   Penicillins Other (See Comments)    Childhood reaction "redness"  Has patient had a PCN reaction causing immediate rash, facial/tongue/throat swelling, SOB or lightheadedness with hypotension: No Has patient had a PCN reaction causing severe rash involving mucus membranes or skin necrosis: No Has patient had a PCN reaction that required hospitalization: No Has patient had a PCN reaction occurring within the last 10 years: No If all of the above answers are "NO", then may proceed with Cephalosporin use.       Family History  Problem Relation  Age of Onset   Cancer Sister    Stroke Other      Social History Mr. Velardo reports that he has never smoked. He has never used smokeless tobacco. Mr. Debell reports current alcohol use.   Review of Systems CONSTITUTIONAL: No weight loss, fever, chills, weakness or fatigue.  HEENT: Eyes: No visual loss, blurred vision, double vision or yellow sclerae.No hearing loss, sneezing, congestion, runny nose or sore throat.  SKIN: No rash or itching.  CARDIOVASCULAR: per hpi RESPIRATORY: No shortness of breath, cough or sputum.  GASTROINTESTINAL: No anorexia, nausea, vomiting or diarrhea. No abdominal pain or blood.  GENITOURINARY: No burning on urination, no polyuria NEUROLOGICAL: No headache, dizziness, syncope, paralysis, ataxia, numbness or tingling in the extremities. No change in bowel or bladder control.  MUSCULOSKELETAL: No muscle, back pain, joint pain or stiffness.  LYMPHATICS: No enlarged nodes. No history of splenectomy.  PSYCHIATRIC: No history of depression or anxiety.  ENDOCRINOLOGIC: No reports of sweating, cold or heat intolerance. No polyuria or polydipsia.  Marland Kitchen   Physical Examination Today's Vitals   04/20/22 0847  BP: 130/78  Pulse: 77  SpO2: 99%  Weight: 209 lb 3.2 oz (94.9 kg)  Height: '6\' 1"'$  (1.854 m)   Body mass index is 27.6 kg/m.  Gen: resting comfortably, no acute distress HEENT: no scleral icterus, pupils equal round and reactive, no palptable cervical adenopathy,  CV: RRR, no m/r/g no jvd Resp: Clear to auscultation bilaterally GI: abdomen is soft, non-tender, non-distended, normal bowel sounds, no hepatosplenomegaly MSK: extremities are warm, no edema.  Skin: warm, no rash Neuro:  no focal deficits Psych: appropriate affect   Diagnostic Studies     Assessment and Plan   1.History of cardiomyopathy - probable stress induced CM in setting of septic shock, LVEF has since normalized - denies any symptoms -continue current meds   2. HTN - bp is  at goal, continue current meds   EKG today shows NSR   Can f/u with [place] just as needed   Arnoldo Lenis, M.D.

## 2022-04-20 NOTE — Patient Instructions (Addendum)
Medication Instructions:  Amlodipine, Coreg, Losartan refilled today  Continue all other medications.     Labwork: none  Testing/Procedures: none  Follow-Up: As needed   Any Other Special Instructions Will Be Listed Below (If Applicable).   If you need a refill on your cardiac medications before your next appointment, please call your pharmacy.

## 2022-04-22 ENCOUNTER — Ambulatory Visit (INDEPENDENT_AMBULATORY_CARE_PROVIDER_SITE_OTHER): Payer: BC Managed Care – PPO

## 2022-04-22 DIAGNOSIS — J309 Allergic rhinitis, unspecified: Secondary | ICD-10-CM | POA: Diagnosis not present

## 2022-04-24 ENCOUNTER — Ambulatory Visit (INDEPENDENT_AMBULATORY_CARE_PROVIDER_SITE_OTHER): Payer: BC Managed Care – PPO | Admitting: *Deleted

## 2022-04-24 DIAGNOSIS — J309 Allergic rhinitis, unspecified: Secondary | ICD-10-CM | POA: Diagnosis not present

## 2022-05-06 ENCOUNTER — Ambulatory Visit (INDEPENDENT_AMBULATORY_CARE_PROVIDER_SITE_OTHER): Payer: BC Managed Care – PPO

## 2022-05-06 DIAGNOSIS — J309 Allergic rhinitis, unspecified: Secondary | ICD-10-CM | POA: Diagnosis not present

## 2022-05-13 ENCOUNTER — Other Ambulatory Visit: Payer: Self-pay | Admitting: Cardiology

## 2022-05-13 ENCOUNTER — Other Ambulatory Visit: Payer: Self-pay | Admitting: Family Medicine

## 2022-05-13 ENCOUNTER — Ambulatory Visit (INDEPENDENT_AMBULATORY_CARE_PROVIDER_SITE_OTHER): Payer: BC Managed Care – PPO

## 2022-05-13 DIAGNOSIS — J309 Allergic rhinitis, unspecified: Secondary | ICD-10-CM | POA: Diagnosis not present

## 2022-05-22 ENCOUNTER — Ambulatory Visit (INDEPENDENT_AMBULATORY_CARE_PROVIDER_SITE_OTHER): Payer: BC Managed Care – PPO

## 2022-05-22 DIAGNOSIS — J309 Allergic rhinitis, unspecified: Secondary | ICD-10-CM | POA: Diagnosis not present

## 2022-05-29 ENCOUNTER — Ambulatory Visit (INDEPENDENT_AMBULATORY_CARE_PROVIDER_SITE_OTHER): Payer: BC Managed Care – PPO

## 2022-05-29 DIAGNOSIS — J309 Allergic rhinitis, unspecified: Secondary | ICD-10-CM

## 2022-06-02 ENCOUNTER — Telehealth: Payer: Self-pay | Admitting: Allergy & Immunology

## 2022-06-02 NOTE — Telephone Encounter (Signed)
I will let [place] talk to him.   Malachi Bonds, MD [facility]

## 2022-06-02 NOTE — Telephone Encounter (Signed)
Patient called today and needs to with injection nurse about double up on injection because he is going to [place].  [phone].

## 2022-06-03 NOTE — Telephone Encounter (Signed)
So is he wanting to injections in one week before he goes to [place]? I am fine with that if 48 hours between.  Malachi Bonds, MD [facility]

## 2022-06-05 ENCOUNTER — Ambulatory Visit (INDEPENDENT_AMBULATORY_CARE_PROVIDER_SITE_OTHER): Payer: BC Managed Care – PPO

## 2022-06-05 DIAGNOSIS — J309 Allergic rhinitis, unspecified: Secondary | ICD-10-CM

## 2022-06-05 NOTE — Telephone Encounter (Signed)
Thanks for talking with him, Trevor Rollins!

## 2022-06-12 ENCOUNTER — Ambulatory Visit (INDEPENDENT_AMBULATORY_CARE_PROVIDER_SITE_OTHER): Payer: BC Managed Care – PPO

## 2022-06-12 DIAGNOSIS — J309 Allergic rhinitis, unspecified: Secondary | ICD-10-CM | POA: Diagnosis not present

## 2022-06-17 ENCOUNTER — Ambulatory Visit (INDEPENDENT_AMBULATORY_CARE_PROVIDER_SITE_OTHER): Payer: BC Managed Care – PPO

## 2022-06-17 DIAGNOSIS — J309 Allergic rhinitis, unspecified: Secondary | ICD-10-CM

## 2022-07-01 ENCOUNTER — Ambulatory Visit (INDEPENDENT_AMBULATORY_CARE_PROVIDER_SITE_OTHER): Payer: BC Managed Care – PPO

## 2022-07-01 DIAGNOSIS — J309 Allergic rhinitis, unspecified: Secondary | ICD-10-CM

## 2022-07-10 ENCOUNTER — Ambulatory Visit (INDEPENDENT_AMBULATORY_CARE_PROVIDER_SITE_OTHER): Payer: BC Managed Care – PPO

## 2022-07-10 DIAGNOSIS — J309 Allergic rhinitis, unspecified: Secondary | ICD-10-CM | POA: Diagnosis not present

## 2022-07-13 ENCOUNTER — Other Ambulatory Visit: Payer: Self-pay | Admitting: Family Medicine

## 2022-07-17 ENCOUNTER — Ambulatory Visit (INDEPENDENT_AMBULATORY_CARE_PROVIDER_SITE_OTHER): Payer: BC Managed Care – PPO

## 2022-07-17 DIAGNOSIS — J309 Allergic rhinitis, unspecified: Secondary | ICD-10-CM

## 2022-07-21 DIAGNOSIS — J3081 Allergic rhinitis due to animal (cat) (dog) hair and dander: Secondary | ICD-10-CM | POA: Diagnosis not present

## 2022-07-21 NOTE — Progress Notes (Signed)
VIALS EXP 07-21-23

## 2022-07-29 ENCOUNTER — Ambulatory Visit (INDEPENDENT_AMBULATORY_CARE_PROVIDER_SITE_OTHER): Payer: BC Managed Care – PPO

## 2022-07-29 DIAGNOSIS — J309 Allergic rhinitis, unspecified: Secondary | ICD-10-CM

## 2022-08-03 ENCOUNTER — Ambulatory Visit (INDEPENDENT_AMBULATORY_CARE_PROVIDER_SITE_OTHER): Payer: BC Managed Care – PPO

## 2022-08-03 ENCOUNTER — Other Ambulatory Visit: Payer: Self-pay | Admitting: Family Medicine

## 2022-08-03 ENCOUNTER — Telehealth: Payer: Self-pay | Admitting: Family Medicine

## 2022-08-03 DIAGNOSIS — J309 Allergic rhinitis, unspecified: Secondary | ICD-10-CM

## 2022-08-03 MED ORDER — DOXYCYCLINE HYCLATE 100 MG PO TABS
100.0000 mg | ORAL_TABLET | Freq: Two times a day (BID) | ORAL | 0 refills | Status: DC
Start: 2022-08-03 — End: 2022-09-18

## 2022-08-03 NOTE — Telephone Encounter (Signed)
Patient is requesting something strong for his sinuses. He stating going to [place] to go diving with sharks. He didn't want to get water I his ears. [facility]

## 2022-08-03 NOTE — Telephone Encounter (Signed)
Cook, Jayce G, DO     Rx sent.   

## 2022-08-26 ENCOUNTER — Ambulatory Visit (INDEPENDENT_AMBULATORY_CARE_PROVIDER_SITE_OTHER): Payer: BC Managed Care – PPO

## 2022-08-26 DIAGNOSIS — J309 Allergic rhinitis, unspecified: Secondary | ICD-10-CM | POA: Diagnosis not present

## 2022-09-04 ENCOUNTER — Ambulatory Visit (INDEPENDENT_AMBULATORY_CARE_PROVIDER_SITE_OTHER): Payer: BC Managed Care – PPO

## 2022-09-04 DIAGNOSIS — J309 Allergic rhinitis, unspecified: Secondary | ICD-10-CM

## 2022-09-09 ENCOUNTER — Ambulatory Visit (INDEPENDENT_AMBULATORY_CARE_PROVIDER_SITE_OTHER): Payer: BC Managed Care – PPO

## 2022-09-09 DIAGNOSIS — J309 Allergic rhinitis, unspecified: Secondary | ICD-10-CM

## 2022-09-18 ENCOUNTER — Ambulatory Visit: Payer: BC Managed Care – PPO | Admitting: Family Medicine

## 2022-09-18 ENCOUNTER — Ambulatory Visit (INDEPENDENT_AMBULATORY_CARE_PROVIDER_SITE_OTHER): Payer: BC Managed Care – PPO

## 2022-09-18 VITALS — BP 134/78 | HR 91 | Temp 98.6°F | Ht 73.0 in | Wt 221.8 lb

## 2022-09-18 DIAGNOSIS — M545 Low back pain, unspecified: Secondary | ICD-10-CM | POA: Diagnosis not present

## 2022-09-18 DIAGNOSIS — E785 Hyperlipidemia, unspecified: Secondary | ICD-10-CM | POA: Diagnosis not present

## 2022-09-18 DIAGNOSIS — G8929 Other chronic pain: Secondary | ICD-10-CM

## 2022-09-18 DIAGNOSIS — I10 Essential (primary) hypertension: Secondary | ICD-10-CM | POA: Diagnosis not present

## 2022-09-18 DIAGNOSIS — N529 Male erectile dysfunction, unspecified: Secondary | ICD-10-CM

## 2022-09-18 DIAGNOSIS — Z130 Encounter for screening for diseases of the blood and blood-forming organs and certain disorders involving the immune mechanism: Secondary | ICD-10-CM

## 2022-09-18 DIAGNOSIS — J309 Allergic rhinitis, unspecified: Secondary | ICD-10-CM

## 2022-09-18 DIAGNOSIS — Z125 Encounter for screening for malignant neoplasm of prostate: Secondary | ICD-10-CM

## 2022-09-18 MED ORDER — AZELASTINE HCL 0.1 % NA SOLN: 2.0000 | NASAL | 0 refills | Status: DC | PRN

## 2022-09-18 MED ORDER — DIAZEPAM 5 MG PO TABS: 5.0000 mg | ORAL_TABLET | Freq: Two times a day (BID) | ORAL | 3 refills | Status: DC | PRN

## 2022-09-18 MED ORDER — SILDENAFIL CITRATE 50 MG PO TABS: 50.0000 mg | ORAL_TABLET | Freq: Every day | ORAL | 5 refills | Status: DC | PRN

## 2022-09-18 NOTE — Patient Instructions (Signed)
Medications refilled.  Labs ordered.  Take care  Dr. Adriana Simas

## 2022-09-21 DIAGNOSIS — N529 Male erectile dysfunction, unspecified: Secondary | ICD-10-CM | POA: Insufficient documentation

## 2022-09-21 NOTE — Assessment & Plan Note (Signed)
Stable.  Continue current medications.

## 2022-09-21 NOTE — Assessment & Plan Note (Signed)
As needed sildenafil 

## 2022-09-21 NOTE — Progress Notes (Signed)
Subjective:  Patient ID: Trevor Rollins, male    DOB: 1970-10-18  Age: 52 y.o. MRN: [id number]  CC: Follow up   HPI:  52 year old male with hypertension, OSA, RSD, hyperlipidemia, anxiety, low back pain presents for follow-up.  Patient states that he is doing well.  Blood pressure well-controlled with amlodipine, carvedilol, and losartan.  Patient has a lot of difficulty with low back and associated spasm.  He does a lot of traveling and finds significant improvement with as needed use of Valium.Marland Kitchen  He has an upcoming trip.  He would like a refill.  Patient due for labs.  Will order today.  Additionally, patient reports that he has some difficulty maintaining an erection.  He states that his wife has recommended he discuss medication with me.  He is interested in Viagra.  Patient Active Problem List   Diagnosis Date Noted   Erectile dysfunction 09/21/2022   Low back pain 07/30/2021   History of cardiomyopathy 03/21/2021   Eczema 03/21/2021   OSA (obstructive sleep apnea) 04/17/2020   RSD (reflex sympathetic dystrophy) 08/09/2019   Primary hypertension 09/08/2012   Hyperlipidemia 09/08/2012   Generalized anxiety disorder 09/08/2012    Social Hx   Social History   Socioeconomic History   Marital status: Married    Spouse name: Not on file   Number of children: Not on file   Years of education: Not on file   Highest education level: Not on file  Occupational History   Not on file  Tobacco Use   Smoking status: Never   Smokeless tobacco: Never  Vaping Use   Vaping status: Never Used  Substance and Sexual Activity   Alcohol use: Yes    Comment: occasional   Drug use: No   Sexual activity: Not on file  Other Topics Concern   Not on file  Social History Narrative   Not on file   Social Determinants of Health   Financial Resource Strain: Not on file  Food Insecurity: Not on file  Transportation Needs: Not on file  Physical Activity: Not on file  Stress: Not on  file  Social Connections: Unknown (06/22/2021)   Received from [employer]   Social Network    Social Network: Not on file    Review of Systems Per HPI  Objective:  BP 134/78   Pulse 91   Temp 98.6 F (37 C)   Ht 6\' 1"  (1.854 m)   Wt 221 lb 12.8 oz (100.6 kg)   SpO2 97%   BMI 29.26 kg/m      09/18/2022   10:42 AM 04/20/2022    8:47 AM 04/09/2022    2:05 PM  BP/Weight  Systolic BP 134 130 119  Diastolic BP 78 78 81  Wt. (Lbs) 221.8 209.2 211  BMI 29.26 kg/m2 27.6 kg/m2 27.46 kg/m2    Physical Exam Vitals and nursing note reviewed.  Constitutional:      General: He is not in acute distress.    Appearance: Normal appearance.  HENT:     Head: Normocephalic and atraumatic.  Cardiovascular:     Rate and Rhythm: Normal rate and regular rhythm.  Pulmonary:     Effort: Pulmonary effort is normal.     Breath sounds: Normal breath sounds. No wheezing, rhonchi or rales.  Neurological:     Mental Status: He is alert.  Psychiatric:        Mood and Affect: Mood normal.     Lab Results  Component Value Date  WBC 5.5 07/30/2021   HGB 14.7 07/30/2021   HCT 42.0 07/30/2021   PLT 219 07/30/2021   GLUCOSE 98 07/30/2021   CHOL 188 07/30/2021   TRIG 53 07/30/2021   HDL 94 07/30/2021   LDLCALC 84 07/30/2021   ALT 22 07/30/2021   AST 21 07/30/2021   NA 143 07/30/2021   K 4.6 07/30/2021   CL 100 07/30/2021   CREATININE 0.74 (L) 07/30/2021   BUN 11 07/30/2021   CO2 24 07/30/2021   INR 0.97 12/24/2016   HGBA1C 4.8 07/30/2021     Assessment & Plan:   Problem List Items Addressed This Visit       Cardiovascular and Mediastinum   Primary hypertension - Primary    Stable.  Continue current medications.      Relevant Medications   sildenafil (VIAGRA) 50 MG tablet   Other Relevant Orders   CMP14+EGFR     Other   Hyperlipidemia   Relevant Medications   sildenafil (VIAGRA) 50 MG tablet   Other Relevant Orders   Lipid panel   Low back pain    As needed  diazepam.      Erectile dysfunction    As needed sildenafil.      Other Visit Diagnoses     Screening for deficiency anemia       Relevant Orders   CBC   Prostate cancer screening       Relevant Orders   PSA       Meds ordered this encounter  Medications   azelastine (ASTELIN) 0.1 % nasal spray    Sig: Place 2 sprays into both nostrils as needed for rhinitis. Use in each nostril as directed    Dispense:  30 mL    Refill:  0    NA   diazepam (VALIUM) 5 MG tablet    Sig: Take 1 tablet (5 mg total) by mouth 2 (two) times daily as needed.    Dispense:  30 tablet    Refill:  3   sildenafil (VIAGRA) 50 MG tablet    Sig: Take 1-2 tablets (50-100 mg total) by mouth daily as needed for erectile dysfunction. 1 hour prior to intercourse.    Dispense:  20 tablet    Refill:  5    Follow-up:  Return in about 6 months (around 03/21/2023).  Everlene Other DO [facility]

## 2022-09-21 NOTE — Assessment & Plan Note (Signed)
As needed diazepam.

## 2022-09-29 ENCOUNTER — Other Ambulatory Visit: Payer: Self-pay | Admitting: Family Medicine

## 2022-09-29 ENCOUNTER — Other Ambulatory Visit: Payer: Self-pay | Admitting: Cardiology

## 2022-10-02 ENCOUNTER — Ambulatory Visit (INDEPENDENT_AMBULATORY_CARE_PROVIDER_SITE_OTHER): Payer: BC Managed Care – PPO

## 2022-10-02 DIAGNOSIS — J309 Allergic rhinitis, unspecified: Secondary | ICD-10-CM | POA: Diagnosis not present

## 2022-10-08 ENCOUNTER — Ambulatory Visit: Payer: BC Managed Care – PPO | Admitting: Neurology

## 2022-10-08 ENCOUNTER — Encounter: Payer: Self-pay | Admitting: Neurology

## 2022-10-08 VITALS — BP 142/94 | HR 91 | Ht 73.0 in | Wt 219.0 lb

## 2022-10-08 DIAGNOSIS — R6521 Severe sepsis with septic shock: Secondary | ICD-10-CM

## 2022-10-08 DIAGNOSIS — G4733 Obstructive sleep apnea (adult) (pediatric): Secondary | ICD-10-CM | POA: Diagnosis not present

## 2022-10-08 DIAGNOSIS — G9050 Complex regional pain syndrome I, unspecified: Secondary | ICD-10-CM | POA: Diagnosis not present

## 2022-10-08 DIAGNOSIS — A419 Sepsis, unspecified organism: Secondary | ICD-10-CM

## 2022-10-08 NOTE — Progress Notes (Signed)
Provider:  Melvyn Novas, MD  Primary Care Physician:  Tommie Sams, DO [address] [place] [address]     Referring Provider: Shona Simpson [address] [place],  [place] [address]          Chief Complaint according to patient   Patient presents with:     New Patient (Initial Visit)           HISTORY OF PRESENT ILLNESS:  Trevor Rollins is a 52 y.o. male caucasian patient who is seen here for revisit 10/08/2022 for  PAP therapy follow -up.  Chief concern according to patient : CPAP user-  DME [facility] is where he gets supplies. Has 2 resmed machines paid out of pocket  07/27/2020.  That one is used in another house and the main one purchased out of pocket was set up 02/11/2021. Would be interested in ordering a travel cpap. He is back on a diet , 1000 kcal a day. Low carb.  I enjoyed Mr. Schwendiman report about his travel to [place] and he has been able to travel with his CPAP but it would be easier with a travel CPAP.  His compliance is excellent,  merging the to reports of his 2 out-of-pocket paid CPAP machines , is 100% - he used the AirSense 11 model at home 23 out of 30 days and the second CPAP 7 out of 30 days both of them allowed for an AHI of 0.2.  The setting is 5 through 15 cm water auto titration.  He has a 95th percentile pressure of 9 cm water on 1 and 10.6 cm water on the other.  The difference here is that he has 1 cm EPR with a lower 95th percentile and 3 cm EPR with a higher 95th percentile his residual apneas show no central apneas or Cheyne-Stokes respirations arising.  No air leak.  And based on this I would set a travel CPAP machine to 9 cm water pressure         [name] is a 33 - year- old Caucasian male patient is seen upon a referral on 03/07/2020 from NP [facility], Cardiology.   Chief concern according to patient :  The patient was hospitalized at [facility], Mr. [name] was admitted with septic shock he had been presenting on 28 October 2019 finally to the hospital following 3 days of nausea, vomiting diarrhea and severe dehydration intense chills, was first seen in urgent care with a blood pressure of only 85/68 mmHg in the emergency room he was tachypneic his oxygen pressures were very low and he had lactic acidosis.  His creatinine had risen 201.47 his AST to 89 his ALT to 61.  Initial troponin of 208.  Covid test initially negative, chest x-ray showed cardiomegaly without edema and left lower lobe atelectasis was noted.  He was placed on pressors and stress dose steroids along with fluids IV resuscitation.  Echocardiogram demonstrated an EF of 40% with global hypokinesis.  Repeat Covid testing was negative that time troponins troponins were elevated starting at 208-230 then 2 2268 and finally 1664.  Dr. 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  And he recommended an outpatient sleep study.     Repeat Echocardiogram was performed after chest pain in 01-2020. Echo showed recovery. He attributes the stress to his membership in the local [employer]. [name] is a right handed  Caucasian male who has a past medical history of Allergy, Anxiety, Asthma, DDD (degenerative disc disease), Deafness in left ear, ED (erectile dysfunction), Hyperlipidemia, Hypertension, Hypertriglyceridemia, and Insomnia. Recent septic shock. CHF. He has chronic sciatica and is followed by neurosurgeon.  The patient also has deafness in the left ear, there has been a history of he has insomnia but only many years ago did he have insomnia related to chronic pain and this has been treated by a separate specialist surgeon was Dr. Odette Fraction he is a neurosurgery patient and had an implanted pulse generator.  Battery exchanged in 12-24-2016.  He had an oral ankle fracture in 2016 wisdom tooth were extracted.  His medication list was reviewed.  Echocardiogram was reviewed 01-25-20 by estimation back to 55% left  ventricle normal function no regional wall motion abnormalities noted anymore       Review of Systems: Out of a complete 14 system review, the patient complains of only the following symptoms, and all other reviewed systems are negative.:  Fatigue, sleepiness , CHF patient , walks with a cane    How likely are you to doze in the following situations: 0 = not likely, 1 = slight chance, 2 = moderate chance, 3 = high chance   Sitting and Reading? Watching Television? Sitting inactive in a public place (theater or meeting)? As a passenger in a car for an hour without a break? Lying down in the afternoon when circumstances permit? Sitting and talking to someone? Sitting quietly after lunch without alcohol? In a car, while stopped for a few minutes in traffic?   Total = 0/ 24 points   FSS endorsed at 18/ 63 points.   Social History   Socioeconomic History   Marital status: Married    Spouse name: Not on file   Number of children: Not on file   Years of education: Not on file   Highest education level: Not on file  Occupational History   Not on file  Tobacco Use   Smoking status: Never   Smokeless tobacco: Never  Vaping Use   Vaping status: Never Used  Substance and Sexual Activity   Alcohol use: Yes    Comment: occasional   Drug use: No   Sexual activity: Not on file  Other Topics Concern   Not on file  Social History Narrative   Not on file   Social Determinants of Health   Financial Resource Strain: Not on file  Food Insecurity: Not on file  Transportation Needs: Not on file  Physical Activity: Not on file  Stress: Not on file  Social Connections: Unknown (06/22/2021)   Received from [facility]   Social Network    Social Network: Not on file    Family History  Problem Relation Age of Onset   Cancer Sister    Stroke Other     Past Medical History:  Diagnosis Date   Allergy    Anxiety    Asthma    DDD (degenerative disc disease)    pt denies    Deafness in left ear    Eczema 03/21/2021   ED (erectile dysfunction)    Hyperlipidemia    Hypertension    Hypertriglyceridemia    Insomnia     Past Surgical History:  Procedure Laterality Date   ORIF ANKLE FRACTURE Right 03/01/2014   Procedure: OPEN REDUCTION INTERNAL FIXATION (ORIF) RIGHT ANKLE FRACTURE;  Surgeon: Cheral Almas, MD;  Location: [facility] OR;  Service: Orthopedics;  Laterality: Right;  SPINAL CORD STIMULATOR BATTERY EXCHANGE N/A 12/24/2016   Procedure: Replacement of implantable pulse generator,;  Surgeon: Odette Fraction, MD;  Location: [facility];  Service: Neurosurgery;  Laterality: N/A;  Replacement of implantable pulse generator, possible replacement of leads on spinal cord stimulator   SPINAL CORD STIMULATOR IMPLANT     TONSILLECTOMY     WISDOM TOOTH EXTRACTION       Current Outpatient Medications on File Prior to Visit  Medication Sig Dispense Refill   amLODipine (NORVASC) 10 MG tablet take 1 tablet by mouth once daily. 90 tablet 0   azelastine (ASTELIN) 0.1 % nasal spray Place 2 sprays into both nostrils as needed for rhinitis. Use in each nostril as directed 30 mL 0   carvedilol (COREG) 3.125 MG tablet take (1) tablet twice daily. 180 tablet 0   citalopram (CELEXA) 20 MG tablet take 1 tablet once daily 90 tablet 3   diazepam (VALIUM) 5 MG tablet Take 1 tablet (5 mg total) by mouth 2 (two) times daily as needed. 30 tablet 3   EPINEPHrine 0.3 mg/0.3 mL IJ SOAJ injection SMARTSIG:Milliliter(s) IM     fluticasone (FLONASE) 50 MCG/ACT nasal spray Administer 2 sprays in each nostril daily.     losartan (COZAAR) 50 MG tablet take 1 tablet by mouth once daily. 90 tablet 0   methocarbamol (ROBAXIN) 750 MG tablet TAKE 1 TABLET EVERY 8 HOURS AS NEEDED FOR MUSCLE SPASMS. 90 tablet 0   sildenafil (VIAGRA) 50 MG tablet Take 1-2 tablets (50-100 mg total) by mouth daily as needed for erectile dysfunction. 1 hour prior to intercourse. 20 tablet 5   No current facility-administered  medications on file prior to visit.    Allergies  Allergen Reactions   Penicillins Other (See Comments)    Childhood reaction "redness"   Has patient had a PCN reaction causing immediate rash, facial/tongue/throat swelling, SOB or lightheadedness with hypotension: No  Has patient had a PCN reaction causing severe rash involving mucus membranes or skin necrosis: No  Has patient had a PCN reaction that required hospitalization: No  Has patient had a PCN reaction occurring within the last 10 years: No  If all of the above answers are "NO", then may proceed with Cephalosporin use.  Childhood reaction "redness" , Has patient had a PCN reaction causing immediate rash, facial/tongue/throat swelling, SOB or lightheadedness with hypotension: No, Has patient had a PCN reaction causing severe rash involving mucus membranes or skin necrosis: No, Has patient had a PCN reaction that required hospitalization: No, Has patient had a PCN reaction occurring within the last 10 years: No, If all of the above answers are "NO", then may proceed with Cephalosporin use.     DIAGNOSTIC DATA (LABS, IMAGING, TESTING) - I reviewed patient records, labs, notes, testing and imaging myself where available.  Lab Results  Component Value Date   WBC 5.5 07/30/2021   HGB 14.7 07/30/2021   HCT 42.0 07/30/2021   MCV 100 (H) 07/30/2021   PLT 219 07/30/2021      Component Value Date/Time   NA 143 07/30/2021 1334   K 4.6 07/30/2021 1334   CL 100 07/30/2021 1334   CO2 24 07/30/2021 1334   GLUCOSE 98 07/30/2021 1334   GLUCOSE 115 (H) 11/01/2019 0455   BUN 11 07/30/2021 1334   CREATININE 0.74 (L) 07/30/2021 1334   CREATININE 0.93 08/19/2012 1401   CALCIUM 9.4 07/30/2021 1334   PROT 7.2 07/30/2021 1334   ALBUMIN 4.7 07/30/2021 1334   AST 21 07/30/2021  1334   ALT 22 07/30/2021 1334   ALKPHOS 100 07/30/2021 1334   BILITOT 0.5 07/30/2021 1334   GFRNONAA 52 (L) 11/01/2019 0455   GFRAA >60 11/01/2019 0455   Lab  Results  Component Value Date   CHOL 188 07/30/2021   HDL 94 07/30/2021   LDLCALC 84 07/30/2021   TRIG 53 07/30/2021   CHOLHDL 2.0 07/30/2021   Lab Results  Component Value Date   HGBA1C 4.8 07/30/2021   No results found for: "VITAMINB12" No results found for: "TSH"  PHYSICAL EXAM:  Today's Vitals   10/08/22 1348  BP: (!) 142/94  Pulse: 91  Weight: 219 lb (99.3 kg)  Height: 6\' 1"  (1.854 m)   Body mass index is 28.89 kg/m.   Wt Readings from Last 3 Encounters:  10/08/22 219 lb (99.3 kg)  09/18/22 221 lb 12.8 oz (100.6 kg)  04/20/22 209 lb 3.2 oz (94.9 kg)     Ht Readings from Last 3 Encounters:  10/08/22 6\' 1"  (1.854 m)  09/18/22 6\' 1"  (1.854 m)  04/20/22 6\' 1"  (1.854 m)      General: The patient is awake, alert and appears  in chronic distress. The patient is well groomed. Head: Normocephalic, atraumatic.  Neck is supple. Mallampati 2,  neck circumference: 17.5  inches.  Nasal airflow congestion .  Retrognathia is mildly present . Dental status: intact.  There is a jaw tremor.  Cardiovascular:  Regular rate and cardiac rhythm by pulse, without distended neck veins. Respiratory: Lungs are clear to auscultation.  Skin:  Without evidence of ankle edema, or rash. Trunk: The patient's posture is seated - relaxed, and he uses a cane.    Neurologic exam : The patient is awake and alert, oriented to place and time.   Memory subjective described as intact.  Attention span & concentration ability appears normal.  Speech is fluent,  without dysarthria, dysphonia or aphasia.  Mood and affect are appropriate, he seems calm and happy.   Cranial nerves: no loss of smell or taste reported  Pupils are equal and briskly reactive to light. Funduscopic exam deferred.   Extraocular movements in vertical and horizontal planes were intact and without nystagmus. No Diplopia. Visual fields by finger perimetry are intact. Hearing was intact to soft voice and finger rubbing.     Facial sensation intact to fine touch.  Facial motor strength is symmetric and tongue and uvula move midline.  Neck ROM : rotation, tilt and flexion extension were normal for age and shoulder shrug was symmetrical.    Motor exam:  Symmetric bulk, tone and ROM in upper extremities- I ma not  evluating gait and strength for the lower extremities. .   Normal tone without cog wheeling, symmetric grip strength .  Sensory:  Fine touch  and vibration were normal.  Proprioception tested in the upper extremities was normal.  Coordination: /Deep tendon reflexes: in the upper and lower extremities are symmetric and intact.  Babinski response was deferred .   ASSESSMENT AND PLAN 52 y.o. year old male  here with: History of cardiomyopathy -10/2019 echo LVEF 40-45% by echo.Mod RV dysfunction. LV dysfunction in setting of septic shock, possibly stress induced CM given it resolved with f/u echo. Elevated trop at the time, with resolution of systolic dysfunction have not pursued ishcemic testing.  - 01/2020 echo: 55-60%. Normal RV - compliant with meds.  - no SOB/DOE, no LE edema - compliant with meds   OSA.    1) High CPAP compliance,  100% and 95% is 9 cm water,   I will set a travel CPAP to that pressure, will print prescription for him.  OSA _ his sleep apnea was mild , supine at 14.1/h. this was his baseline while he was still suffering form CHF post sepsis.    I plan to follow up either personally or through our NP within 12 months.   I would like to thank Dr Wyline Mood and Tommie Sams, Do [address] [place],  [place] [address] for allowing me to meet with and to take care of this pleasant patient.    After spending a total time of  23  minutes face to face and additional time for physical and neurologic examination, review of laboratory studies,  personal review of imaging studies, reports and results of other testing and review of referral information / records as far as provided in  visit,   Electronically signed by: Melvyn Novas, MD 10/08/2022 1:53 PM  [facility] Board certified by The [employer] of Sleep Medicine and Diplomate of the [employer] of Sleep Medicine. Board certified In Neurology through the ABPN, Fellow of the [employer] of Neu[employer].

## 2022-10-09 ENCOUNTER — Ambulatory Visit (INDEPENDENT_AMBULATORY_CARE_PROVIDER_SITE_OTHER): Payer: BC Managed Care – PPO

## 2022-10-09 DIAGNOSIS — J309 Allergic rhinitis, unspecified: Secondary | ICD-10-CM | POA: Diagnosis not present

## 2022-10-16 ENCOUNTER — Ambulatory Visit (INDEPENDENT_AMBULATORY_CARE_PROVIDER_SITE_OTHER): Payer: BC Managed Care – PPO

## 2022-10-16 DIAGNOSIS — J309 Allergic rhinitis, unspecified: Secondary | ICD-10-CM | POA: Diagnosis not present

## 2022-10-23 ENCOUNTER — Ambulatory Visit (INDEPENDENT_AMBULATORY_CARE_PROVIDER_SITE_OTHER): Payer: Self-pay

## 2022-10-23 DIAGNOSIS — J309 Allergic rhinitis, unspecified: Secondary | ICD-10-CM | POA: Diagnosis not present

## 2022-11-04 ENCOUNTER — Encounter: Payer: Self-pay | Admitting: Neurology

## 2022-11-06 NOTE — Progress Notes (Signed)
EXP 11/09/23

## 2022-11-09 DIAGNOSIS — J3081 Allergic rhinitis due to animal (cat) (dog) hair and dander: Secondary | ICD-10-CM | POA: Diagnosis not present

## 2022-11-11 ENCOUNTER — Ambulatory Visit (INDEPENDENT_AMBULATORY_CARE_PROVIDER_SITE_OTHER): Payer: Self-pay

## 2022-11-11 DIAGNOSIS — J309 Allergic rhinitis, unspecified: Secondary | ICD-10-CM | POA: Diagnosis not present

## 2022-11-13 ENCOUNTER — Other Ambulatory Visit: Payer: Self-pay | Admitting: Family Medicine

## 2022-11-13 DIAGNOSIS — Z1212 Encounter for screening for malignant neoplasm of rectum: Secondary | ICD-10-CM

## 2022-11-13 DIAGNOSIS — Z1211 Encounter for screening for malignant neoplasm of colon: Secondary | ICD-10-CM

## 2022-12-09 ENCOUNTER — Ambulatory Visit (INDEPENDENT_AMBULATORY_CARE_PROVIDER_SITE_OTHER): Payer: BC Managed Care – PPO

## 2022-12-09 DIAGNOSIS — J309 Allergic rhinitis, unspecified: Secondary | ICD-10-CM

## 2022-12-18 ENCOUNTER — Ambulatory Visit (INDEPENDENT_AMBULATORY_CARE_PROVIDER_SITE_OTHER): Payer: BC Managed Care – PPO

## 2022-12-18 DIAGNOSIS — J309 Allergic rhinitis, unspecified: Secondary | ICD-10-CM | POA: Diagnosis not present

## 2023-01-01 ENCOUNTER — Ambulatory Visit (INDEPENDENT_AMBULATORY_CARE_PROVIDER_SITE_OTHER): Payer: Self-pay

## 2023-01-01 DIAGNOSIS — J309 Allergic rhinitis, unspecified: Secondary | ICD-10-CM

## 2023-01-14 ENCOUNTER — Encounter: Payer: Self-pay | Admitting: Family Medicine

## 2023-01-14 ENCOUNTER — Ambulatory Visit: Payer: BC Managed Care – PPO | Admitting: Family Medicine

## 2023-01-14 VITALS — BP 128/75 | HR 86 | Temp 97.0°F | Ht 73.0 in | Wt 224.0 lb

## 2023-01-14 DIAGNOSIS — L309 Dermatitis, unspecified: Secondary | ICD-10-CM

## 2023-01-14 DIAGNOSIS — M545 Low back pain, unspecified: Secondary | ICD-10-CM

## 2023-01-14 DIAGNOSIS — G8929 Other chronic pain: Secondary | ICD-10-CM

## 2023-01-14 DIAGNOSIS — Z23 Encounter for immunization: Secondary | ICD-10-CM | POA: Diagnosis not present

## 2023-01-14 MED ORDER — TRIAMCINOLONE ACETONIDE 0.5 % EX OINT: 1.0000 | TOPICAL_OINTMENT | Freq: Two times a day (BID) | CUTANEOUS | 0 refills | Status: DC

## 2023-01-14 MED ORDER — PREDNISONE 10 MG PO TABS: ORAL_TABLET | ORAL | 0 refills | Status: DC

## 2023-01-14 NOTE — Progress Notes (Signed)
Subjective:  Patient ID: Trevor Rollins, male    DOB: 16-Apr-1970  Age: 52 y.o. MRN: [id number]  CC:   Chief Complaint  Patient presents with   chronic low back pain     Requesting prednisone for back pain has a trip coming up    Eczema    Recommendations for a cream or a daily lotion     HPI:  52 year old male presents for evaluation of the above.  Patient has issues with flares of low back pain. Responds to Corticosteroids. He would like to have it on hand for upcoming trip to the [place] (if needed)>  Reports recent flare of eczema on the left lower extremity.   Patient Active Problem List   Diagnosis Date Noted   Erectile dysfunction 09/21/2022   Low back pain 07/30/2021   History of cardiomyopathy 03/21/2021   Eczema 03/21/2021   OSA (obstructive sleep apnea) 04/17/2020   RSD (reflex sympathetic dystrophy) 08/09/2019   Primary hypertension 09/08/2012   Hyperlipidemia 09/08/2012   Generalized anxiety disorder 09/08/2012    Social Hx   Social History   Socioeconomic History   Marital status: Married    Spouse name: Not on file   Number of children: Not on file   Years of education: Not on file   Highest education level: Not on file  Occupational History   Not on file  Tobacco Use   Smoking status: Never   Smokeless tobacco: Never  Vaping Use   Vaping status: Never Used  Substance and Sexual Activity   Alcohol use: Yes    Comment: occasional   Drug use: No   Sexual activity: Not on file  Other Topics Concern   Not on file  Social History Narrative   Not on file   Social Determinants of Health   Financial Resource Strain: Not on file  Food Insecurity: Not on file  Transportation Needs: Not on file  Physical Activity: Not on file  Stress: Not on file  Social Connections: Unknown (11/30/2022)   Received from [employer]   Social Network    Social Network: Not on file    Review of Systems Per HPI  Objective:  BP 128/75   Pulse 86   Temp  (!) 97 F (36.1 C)   Ht 6\' 1"  (1.854 m)   Wt 224 lb (101.6 kg)   SpO2 97%   BMI 29.55 kg/m      01/14/2023    1:46 PM 10/08/2022    1:48 PM 09/18/2022   10:42 AM  BP/Weight  Systolic BP 128 142 134  Diastolic BP 75 94 78  Wt. (Lbs) 224 219 221.8  BMI 29.55 kg/m2 28.89 kg/m2 29.26 kg/m2    Physical Exam Vitals and nursing note reviewed.  Constitutional:      General: He is not in acute distress.    Appearance: Normal appearance.  HENT:     Head: Normocephalic and atraumatic.  Cardiovascular:     Rate and Rhythm: Normal rate and regular rhythm.  Pulmonary:     Effort: Pulmonary effort is normal.     Breath sounds: Normal breath sounds.  Skin:    Comments: Left lower extremity with erythematous dry patches.  Neurological:     Mental Status: He is alert.     Lab Results  Component Value Date   WBC 5.5 07/30/2021   HGB 14.7 07/30/2021   HCT 42.0 07/30/2021   PLT 219 07/30/2021   GLUCOSE 98 07/30/2021  CHOL 188 07/30/2021   TRIG 53 07/30/2021   HDL 94 07/30/2021   LDLCALC 84 07/30/2021   ALT 22 07/30/2021   AST 21 07/30/2021   NA 143 07/30/2021   K 4.6 07/30/2021   CL 100 07/30/2021   CREATININE 0.74 (L) 07/30/2021   BUN 11 07/30/2021   CO2 24 07/30/2021   INR 0.97 12/24/2016   HGBA1C 4.8 07/30/2021     Assessment & Plan:   Problem List Items Addressed This Visit       Musculoskeletal and Integument   Eczema    Treating with topical Triamcinolone.        Other   Low back pain - Primary    Prednisone if needed during upcoming trip.      Relevant Medications   predniSONE (DELTASONE) 10 MG tablet   Other Visit Diagnoses     Immunization due       Relevant Orders   Flu vaccine trivalent PF, 6mos and older(Flulaval,Afluria,Fluarix,Fluzone) (Completed)       Meds ordered this encounter  Medications   predniSONE (DELTASONE) 10 MG tablet    Sig: 50 mg daily x 2 days, then 40 mg daily x 2 days, then 30 mg daily x 2 days, then 20 mg daily x 2  days, then 10 mg daily x 2 days.    Dispense:  30 tablet    Refill:  0   triamcinolone ointment (KENALOG) 0.5 %    Sig: Apply 1 Application topically 2 (two) times daily.    Dispense:  30 g    Refill:  0    Follow-up:  Return in about 6 months (around 07/15/2023).  Everlene Other DO [facility]

## 2023-01-14 NOTE — Assessment & Plan Note (Signed)
Treating with topical Triamcinolone.

## 2023-01-14 NOTE — Patient Instructions (Signed)
Try CeraVe.  Medications as directed.  Follow up in 6 months.

## 2023-01-14 NOTE — Assessment & Plan Note (Signed)
Prednisone if needed during upcoming trip.

## 2023-01-29 ENCOUNTER — Ambulatory Visit (INDEPENDENT_AMBULATORY_CARE_PROVIDER_SITE_OTHER): Payer: BC Managed Care – PPO

## 2023-01-29 DIAGNOSIS — J309 Allergic rhinitis, unspecified: Secondary | ICD-10-CM

## 2023-02-19 ENCOUNTER — Ambulatory Visit (INDEPENDENT_AMBULATORY_CARE_PROVIDER_SITE_OTHER): Payer: Self-pay

## 2023-02-19 DIAGNOSIS — J309 Allergic rhinitis, unspecified: Secondary | ICD-10-CM | POA: Diagnosis not present

## 2023-02-26 ENCOUNTER — Ambulatory Visit (INDEPENDENT_AMBULATORY_CARE_PROVIDER_SITE_OTHER): Payer: Self-pay

## 2023-02-26 DIAGNOSIS — J309 Allergic rhinitis, unspecified: Secondary | ICD-10-CM | POA: Diagnosis not present

## 2023-03-02 ENCOUNTER — Other Ambulatory Visit: Payer: Self-pay | Admitting: Cardiology

## 2023-03-03 ENCOUNTER — Ambulatory Visit (INDEPENDENT_AMBULATORY_CARE_PROVIDER_SITE_OTHER): Payer: Self-pay

## 2023-03-03 DIAGNOSIS — J309 Allergic rhinitis, unspecified: Secondary | ICD-10-CM

## 2023-03-06 ENCOUNTER — Other Ambulatory Visit: Payer: Self-pay

## 2023-03-06 ENCOUNTER — Ambulatory Visit
Admission: EM | Admit: 2023-03-06 | Discharge: 2023-03-06 | Disposition: A | Discharge status: Home / Self Care | Payer: 59 | Attending: Family Medicine | Admitting: Family Medicine

## 2023-03-06 ENCOUNTER — Encounter: Payer: Self-pay | Admitting: Emergency Medicine

## 2023-03-06 DIAGNOSIS — H00012 Hordeolum externum right lower eyelid: Secondary | ICD-10-CM

## 2023-03-06 MED ORDER — POLYMYXIN B-TRIMETHOPRIM 10000-0.1 UNIT/ML-% OP SOLN: 1.0000 [drp] | Freq: Four times a day (QID) | OPHTHALMIC | 0 refills | Status: DC

## 2023-03-06 NOTE — ED Provider Notes (Signed)
[facility]    CSN: [id number] Arrival date & time: 03/06/23  1049      History   Chief Complaint Chief Complaint  Patient presents with   Facial Swelling    HPI Trevor Rollins is a 53 y.o. male.   Patient presenting today with 1 day history of left medial lower eyelid redness, swelling, tenderness that started yesterday.  He denies injury to the area, drainage, visual change, eye pain, headache, nausea, vomiting, fevers, new facial products use.  So for now try anything over-the-counter for symptoms.    Past Medical History:  Diagnosis Date   Allergy    Anxiety    Asthma    DDD (degenerative disc disease)    pt denies   Deafness in left ear    Eczema 03/21/2021   ED (erectile dysfunction)    Hyperlipidemia    Hypertension    Hypertriglyceridemia    Insomnia     Patient Active Problem List   Diagnosis Date Noted   Erectile dysfunction 09/21/2022   Low back pain 07/30/2021   History of cardiomyopathy 03/21/2021   Eczema 03/21/2021   OSA (obstructive sleep apnea) 04/17/2020   RSD (reflex sympathetic dystrophy) 08/09/2019   Primary hypertension 09/08/2012   Hyperlipidemia 09/08/2012   Generalized anxiety disorder 09/08/2012    Past Surgical History:  Procedure Laterality Date   ORIF ANKLE FRACTURE Right 03/01/2014   Procedure: OPEN REDUCTION INTERNAL FIXATION (ORIF) RIGHT ANKLE FRACTURE;  Surgeon: Cheral Almas, MD;  Location: [facility] OR;  Service: Orthopedics;  Laterality: Right;   SPINAL CORD STIMULATOR BATTERY EXCHANGE N/A 12/24/2016   Procedure: Replacement of implantable pulse generator,;  Surgeon: Odette Fraction, MD;  Location: [facility] OR;  Service: Neurosurgery;  Laterality: N/A;  Replacement of implantable pulse generator, possible replacement of leads on spinal cord stimulator   SPINAL CORD STIMULATOR IMPLANT     TONSILLECTOMY     WISDOM TOOTH EXTRACTION         Home Medications    Prior to Admission medications   Medication Sig Start Date  End Date Taking? Authorizing Provider  trimethoprim-polymyxin b (POLYTRIM) ophthalmic solution Place 1 drop into the right eye every 6 (six) hours. 03/06/23  Yes Particia Nearing, PA-C  amLODipine (NORVASC) 10 MG tablet take 1 tablet by mouth once daily. 03/02/23   Antoine Poche, MD  azelastine (ASTELIN) 0.1 % nasal spray Place 2 sprays into both nostrils as needed for rhinitis. Use in each nostril as directed 09/18/22   Tommie Sams, DO  carvedilol (COREG) 3.125 MG tablet take (1) tablet twice daily. 03/02/23   Antoine Poche, MD  citalopram (CELEXA) 20 MG tablet take 1 tablet once daily 09/29/22   Cook, Jayce G, DO  diazepam (VALIUM) 5 MG tablet Take 1 tablet (5 mg total) by mouth 2 (two) times daily as needed. 09/18/22   Tommie Sams, DO  EPINEPHrine 0.3 mg/0.3 mL IJ SOAJ injection SMARTSIG:Milliliter(s) IM 02/20/22   [provider]  fluticasone (FLONASE) 50 MCG/ACT nasal spray Administer 2 sprays in each nostril daily. 01/06/22   [provider]  losartan (COZAAR) 50 MG tablet take 1 tablet by mouth once daily. 03/02/23   Antoine Poche, MD  predniSONE (DELTASONE) 10 MG tablet 50 mg daily x 2 days, then 40 mg daily x 2 days, then 30 mg daily x 2 days, then 20 mg daily x 2 days, then 10 mg daily x 2 days. 01/14/23   Tommie Sams, DO  sildenafil (VIAGRA) 50 MG tablet Take 1-2 tablets (50-100 mg total) by mouth daily as needed for erectile dysfunction. 1 hour prior to intercourse. 09/18/22   Tommie Sams, DO  triamcinolone ointment (KENALOG) 0.5 % Apply 1 Application topically 2 (two) times daily. 01/14/23   Tommie Sams, DO    Family History Family History  Problem Relation Age of Onset   Cancer Sister    Stroke Other     Social History Social History   Tobacco Use   Smoking status: Never   Smokeless tobacco: Never  Vaping Use   Vaping status: Never Used  Substance Use Topics   Alcohol use: Yes    Comment: occasional   Drug use: No     Allergies    Penicillins   Review of Systems Review of Systems Per HPI  Physical Exam Triage Vital Signs ED Triage Vitals  Encounter Vitals Group     BP 03/06/23 1119 114/74     Systolic BP Percentile --      Diastolic BP Percentile --      Pulse Rate 03/06/23 1119 82     Resp 03/06/23 1119 20     Temp 03/06/23 1119 98.2 F (36.8 C)     Temp Source 03/06/23 1119 Oral     SpO2 03/06/23 1119 96 %     Weight --      Height --      Head Circumference --      Peak Flow --      Pain Score 03/06/23 1116 2     Pain Loc --      Pain Education --      Exclude from Growth Chart --    No data found.  Updated Vital Signs BP 114/74 (BP Location: Right Arm)   Pulse 82   Temp 98.2 F (36.8 C) (Oral)   Resp 20   SpO2 96%   Visual Acuity Right Eye Distance:   Left Eye Distance:   Bilateral Distance:    Right Eye Near:   Left Eye Near:    Bilateral Near:     Physical Exam Vitals and nursing note reviewed.  Constitutional:      Appearance: Normal appearance.  HENT:     Head: Atraumatic.     Mouth/Throat:     Mouth: Mucous membranes are moist.  Eyes:     Extraocular Movements: Extraocular movements intact.     Pupils: Pupils are equal, round, and reactive to light.     Comments: Pustular lesion present to the inner right lower eyelid medially, not actively draining or bleeding.  Some erythema and edema to the coordinating area externally just below the eyelid  Cardiovascular:     Rate and Rhythm: Normal rate.  Pulmonary:     Effort: Pulmonary effort is normal.  Musculoskeletal:        General: Normal range of motion.     Cervical back: Normal range of motion and neck supple.  Skin:    General: Skin is warm and dry.  Neurological:     General: No focal deficit present.     Mental Status: He is oriented to person, place, and time.     Motor: No weakness.     Gait: Gait normal.  Psychiatric:        Mood and Affect: Mood normal.        Thought Content: Thought content normal.         Judgment: Judgment normal.  UC Treatments / Results  Labs (all labs ordered are listed, but only abnormal results are displayed) Labs Reviewed - No data to display  EKG   Radiology No results found.  Procedures Procedures (including critical care time)  Medications Ordered in UC Medications - No data to display  Initial Impression / Assessment and Plan / UC Course  I have reviewed the triage vital signs and the nursing notes.  Pertinent labs & imaging results that were available during my care of the patient were reviewed by me and considered in my medical decision making (see chart for details).     Consistent with stye.  Visual acuity declined as eye itself is benign and vision intact per patient.  Discussed warm compresses, over-the-counter pain relievers, Polytrim drops, lubricating drops.  Return for worsening symptoms.  Final Clinical Impressions(s) / UC Diagnoses   Final diagnoses:  Hordeolum externum of right lower eyelid   Discharge Instructions   None    ED Prescriptions     Medication Sig Dispense Auth. Provider   trimethoprim-polymyxin b (POLYTRIM) ophthalmic solution Place 1 drop into the right eye every 6 (six) hours. 10 mL Particia Nearing, [place]      PDMP not reviewed this encounter.   Particia Nearing, [place] 03/06/23 1330

## 2023-03-06 NOTE — ED Triage Notes (Signed)
Pt reports right sided facial swelling since yesterday. Denies any known injury, self-care medications, foods. Denies visual changes.

## 2023-03-26 ENCOUNTER — Other Ambulatory Visit: Payer: Self-pay | Admitting: Orthopaedic Surgery

## 2023-03-26 ENCOUNTER — Ambulatory Visit: Payer: BC Managed Care – PPO | Admitting: Family Medicine

## 2023-03-26 DIAGNOSIS — M5416 Radiculopathy, lumbar region: Secondary | ICD-10-CM

## 2023-03-26 DIAGNOSIS — M48062 Spinal stenosis, lumbar region with neurogenic claudication: Secondary | ICD-10-CM

## 2023-04-02 ENCOUNTER — Ambulatory Visit (INDEPENDENT_AMBULATORY_CARE_PROVIDER_SITE_OTHER): Payer: 59

## 2023-04-02 DIAGNOSIS — J309 Allergic rhinitis, unspecified: Secondary | ICD-10-CM | POA: Diagnosis not present

## 2023-04-06 NOTE — Discharge Instructions (Signed)

## 2023-04-07 ENCOUNTER — Ambulatory Visit
Admission: RE | Admit: 2023-04-07 | Discharge: 2023-04-07 | Disposition: A | Discharge status: Home / Self Care | Payer: 59 | Source: Ambulatory Visit | Attending: Orthopaedic Surgery | Admitting: Orthopaedic Surgery

## 2023-04-07 DIAGNOSIS — M5416 Radiculopathy, lumbar region: Secondary | ICD-10-CM

## 2023-04-07 DIAGNOSIS — M48062 Spinal stenosis, lumbar region with neurogenic claudication: Secondary | ICD-10-CM

## 2023-04-07 MED ORDER — IOPAMIDOL (ISOVUE-M 200) INJECTION 41%
20.0000 mL | Freq: Once | INTRAMUSCULAR | Status: AC
  Administered 2023-04-07: 20 mL via INTRATHECAL

## 2023-04-07 MED ORDER — DIAZEPAM 5 MG PO TABS
10.0000 mg | ORAL_TABLET | Freq: Once | ORAL | Status: AC
  Administered 2023-04-07: 10 mg via ORAL

## 2023-04-07 MED ORDER — MEPERIDINE HCL 50 MG/ML IJ SOLN: 50.0000 mg | Freq: Once | INTRAMUSCULAR | Status: DC | PRN

## 2023-04-07 MED ORDER — ONDANSETRON HCL 4 MG/2ML IJ SOLN: 4.0000 mg | Freq: Once | INTRAMUSCULAR | Status: DC | PRN

## 2023-04-23 ENCOUNTER — Ambulatory Visit: Payer: BC Managed Care – PPO | Admitting: Family Medicine

## 2023-04-23 ENCOUNTER — Ambulatory Visit (INDEPENDENT_AMBULATORY_CARE_PROVIDER_SITE_OTHER): Payer: Self-pay

## 2023-04-23 DIAGNOSIS — J309 Allergic rhinitis, unspecified: Secondary | ICD-10-CM | POA: Diagnosis not present

## 2023-05-10 DIAGNOSIS — J3081 Allergic rhinitis due to animal (cat) (dog) hair and dander: Secondary | ICD-10-CM | POA: Diagnosis not present

## 2023-05-10 NOTE — Progress Notes (Signed)
 VIALS MADE 05-10-23

## 2023-05-11 DIAGNOSIS — J302 Other seasonal allergic rhinitis: Secondary | ICD-10-CM | POA: Diagnosis not present

## 2023-05-21 ENCOUNTER — Telehealth: Payer: Self-pay | Admitting: Cardiology

## 2023-05-21 NOTE — Telephone Encounter (Signed)
 Called patient LMOVM needs to be scheduled for an appointment.

## 2023-05-21 NOTE — Telephone Encounter (Signed)
 Left message to call back and schedule appt in office for preop clearance.

## 2023-05-21 NOTE — Telephone Encounter (Signed)
   Pre-operative Risk Assessment    Patient Name: Trevor Rollins  DOB: [birth year] MRN: [id number]      Request for Surgical Clearance    Procedure:   L5-S1 TLIF  Date of Surgery:  Clearance TBD                                 Surgeon:  Sharolyn Douglas, M.D. Surgeon's Group or Practice Name:  [facility] Phone number:  [phone] Fax number:  [phone]   Type of Clearance Requested:   - Medical  - Pharmacy:  Hold Please advise office and pt if they need to hold  medication.      Type of Anesthesia:  General    Additional requests/questions:  Please advise surgeon/provider what medications should be held. Please fax a copy of Clearance  to the surgeon's office.  Signed, Delaine Lame   05/21/2023, 12:28 PM

## 2023-05-21 NOTE — Telephone Encounter (Signed)
   Name: KOHNER ORLICK  DOB: [birth year]  MRN: [id number]  Primary Cardiologist: Dina Rich, MD  Chart reviewed as part of pre-operative protocol coverage. Because of Rainier Feuerborn Pellegrino's past medical history and time since last visit, he will require a follow-up in-office visit in order to better assess preoperative cardiovascular risk.  Pre-op covering staff: - Please schedule appointment and call patient to inform them. If patient already had an upcoming appointment within acceptable timeframe, please add "pre-op clearance" to the appointment notes so provider is aware. - Please contact requesting surgeon's office via preferred method (i.e, phone, fax) to inform them of need for appointment prior to surgery.    Roe Rutherford Ranisha Allaire, PA  05/21/2023, 1:04 PM

## 2023-05-21 NOTE — Telephone Encounter (Signed)
 I send a message to [facility] scheduling team to reach out to the pt with an appt.

## 2023-05-24 NOTE — Telephone Encounter (Signed)
 Pt has appt with Lasalle Pointer, NP 07/13/23. I will update all parties involved.

## 2023-05-28 ENCOUNTER — Other Ambulatory Visit: Payer: Self-pay | Admitting: Cardiology

## 2023-05-28 ENCOUNTER — Ambulatory Visit: Attending: Nurse Practitioner | Admitting: Nurse Practitioner

## 2023-05-28 ENCOUNTER — Encounter: Payer: Self-pay | Admitting: Nurse Practitioner

## 2023-05-28 VITALS — BP 126/82 | HR 90 | Ht 73.0 in | Wt 227.4 lb

## 2023-05-28 DIAGNOSIS — I10 Essential (primary) hypertension: Secondary | ICD-10-CM | POA: Diagnosis not present

## 2023-05-28 DIAGNOSIS — Z13220 Encounter for screening for lipoid disorders: Secondary | ICD-10-CM

## 2023-05-28 DIAGNOSIS — Z01810 Encounter for preprocedural cardiovascular examination: Secondary | ICD-10-CM

## 2023-05-28 DIAGNOSIS — Z8679 Personal history of other diseases of the circulatory system: Secondary | ICD-10-CM

## 2023-05-28 DIAGNOSIS — E785 Hyperlipidemia, unspecified: Secondary | ICD-10-CM

## 2023-05-28 NOTE — Patient Instructions (Addendum)
 Medication Instructions:  Your physician recommends that you continue on your current medications as directed. Please refer to the Current Medication list given to you today.  Labwork: Next week at [facility]   Testing/Procedures: None   Follow-Up: Your physician recommends that you schedule a follow-up appointment in: 1 year   Any Other Special Instructions Will Be Listed Below (If Applicable).  If you need a refill on your cardiac medications before your next appointment, please call your pharmacy.

## 2023-05-28 NOTE — Progress Notes (Signed)
 Cardiology Office Note:  .   Date:  05/28/2023 ID:  PADRAIG NHAN, DOB 09/26/1970, MRN [id number] PCP: Cook, Jayce G, DO  [facility] Providers Cardiologist:  Alvan Carrier, MD    History of Present Illness: .   Trevor Rollins is a 53 y.o. male with a PMH of cardiomyopathy, HTN, and OSA on CPAP, who presents today for pre-operative cardiovascular risk assessment.   TTE in 2021 showed EF 40-45%, moderate RV dysfunction, was in setting of septic shock, possibly stress induced. 01/2020 EF returned to normal, normal RV function.   Last seen by Dr. Carrier Alvan on April 20, 2022. Was doing great at the time. Recommended by Dr. Alvan to f/u as needed.   Today he presents for pre-operative cardiovascular risk assessment. He is pending L5 - S1 TLIF with Dr. Royden Schneider of [facility]. Our office has been asked about recs regarding if any medication needs to be held. He is doing well. Doing well as he has been having less stress recently.  Denies any acute cardiac plaints or issues. Denies any chest pain, shortness of breath, palpitations, syncope, presyncope, dizziness, orthopnea, PND, swelling or significant weight changes, acute bleeding, or claudication.  ROS:. Negative. See HPI.  Studies Reviewed: SABRA    EKG:  EKG Interpretation Date/Time:  Friday May 28 2023 15:11:33 EDT Ventricular Rate:  90 PR Interval:  176 QRS Duration:  84 QT Interval:  350 QTC Calculation: 428 R Axis:   0  Text Interpretation: Normal sinus rhythm Normal ECG When compared with ECG of 29-Oct-2019 05:56, Nonspecific T wave abnormality no longer evident in Inferior leads Confirmed by Miriam Norris [phone]) on 05/28/2023 3:38:38 PM   Echo 01/2020:  1. Left ventricular ejection fraction, by estimation, is 55 to 60%. The  left ventricle has normal function. The left ventricle has no regional  wall motion abnormalities. Left ventricular diastolic parameters were  normal. Global longitudinal  strain does  not track completely and is not reported.   2. Right ventricular systolic function is normal. The right ventricular  size is normal. There is normal pulmonary artery systolic pressure. The  estimated right ventricular systolic pressure is 29.8 mmHg.   3. The mitral valve is grossly normal. Trivial mitral valve  regurgitation.   4. The aortic valve is tricuspid. Aortic valve regurgitation is not  visualized. Mild to moderate aortic valve sclerosis/calcification is  present, without any evidence of aortic stenosis.   5. The inferior vena cava is normal in size with greater than 50%  respiratory variability, suggesting right atrial pressure of 3 mmHg.   Comparison(s): Echocardiogram done 10/28/19 showed an Ef of 40-45%.   Physical Exam:   VS:  BP 126/82   Pulse 90   Ht 6' 1 (1.854 m)   Wt 227 lb 6.4 oz (103.1 kg)   SpO2 96%   BMI 30.00 kg/m    Wt Readings from Last 3 Encounters:  05/28/23 227 lb 6.4 oz (103.1 kg)  01/14/23 224 lb (101.6 kg)  10/08/22 219 lb (99.3 kg)    GEN: Well nourished, well developed in no acute distress NECK: No JVD; No carotid bruits CARDIAC: S1/S2, RRR, no murmurs, rubs, gallops RESPIRATORY:  Clear to auscultation without rales, wheezing or rhonchi  ABDOMEN: Soft, non-tender, non-distended EXTREMITIES:  No edema; No deformity   ASSESSMENT AND PLAN: .    Preoperative cardiovascular risk assessment Mr. Vahle perioperative risk of a major cardiac event is 6.6% according to the Revised Cardiac  Risk Index (RCRI).  Therefore, he is at high risk for perioperative complications.   His functional capacity is good at 5.62 METs according to the Duke Activity Status Index (DASI). Recommendations: According to ACC/AHA guidelines, no further cardiovascular testing needed.  The patient may proceed to surgery at acceptable risk.   Antiplatelet and/or Anticoagulation Recommendations: Patient is not on any antiplatelet or anticoagulation medicine that needs  to be held prior to procedure. Will obtain CBC, CMET.     2. Hx of cardiomyopathy Stage C, NYHA class I symptoms.  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  EF recovered in December 2021. Euvolemic and well compensated on exam.  Continue current medication regimen. Low sodium diet, fluid restriction <2L, and daily weights encouraged. Educated to contact our office for weight gain of 2 lbs overnight or 5 lbs in one week.  3. HTN Blood pressure stable. Discussed to monitor BP at home at least 2 hours after medications and sitting for 5-10 minutes.  No medication changes at this time. Heart healthy diet and regular cardiovascular exercise encouraged.  Obtaining labs as mentioned above.  4. Screening for HLD No recent labs on file.  Will obtain FLP in addition to labs as mentioned above. Heart healthy diet and regular cardiovascular exercise encouraged.     Dispo: Follow-up with Dr. Dorn Ross or APP in 1 year or sooner if any changes.  Signed, Almarie Crate, NP

## 2023-06-04 ENCOUNTER — Ambulatory Visit (INDEPENDENT_AMBULATORY_CARE_PROVIDER_SITE_OTHER): Payer: Self-pay

## 2023-06-04 DIAGNOSIS — J309 Allergic rhinitis, unspecified: Secondary | ICD-10-CM | POA: Diagnosis not present

## 2023-06-21 ENCOUNTER — Ambulatory Visit: Admitting: Allergy

## 2023-06-21 ENCOUNTER — Encounter: Payer: Self-pay | Admitting: Allergy

## 2023-06-21 VITALS — BP 122/80 | HR 94 | Temp 98.2°F | Resp 18 | Ht 73.0 in | Wt 223.4 lb

## 2023-06-21 DIAGNOSIS — J3089 Other allergic rhinitis: Secondary | ICD-10-CM

## 2023-06-21 DIAGNOSIS — J302 Other seasonal allergic rhinitis: Secondary | ICD-10-CM

## 2023-06-21 MED ORDER — AZELASTINE HCL 0.1 % NA SOLN: 2.0000 | Freq: Two times a day (BID) | NASAL | 5 refills | Status: AC | PRN

## 2023-06-21 MED ORDER — EPINEPHRINE 0.3 MG/0.3ML IJ SOAJ: 0.3000 mg | INTRAMUSCULAR | 1 refills | Status: AC | PRN

## 2023-06-21 NOTE — Patient Instructions (Addendum)
 Seasonal and perennial allergic rhinitis - continue avoidance measures for grasses, ragweed, weeds, trees, indoor molds, outdoor molds, dust mites, cat, dog, and cockroach. - continue allergy  shots per schedule.  You are at maintenance dosing.  Continue to have access to epinephrine  device on your allergy  shot days.  - use nasal Astelin  2 sprays each nostril twice a day as needed for runny nose/throat clearing.  With using nasal sprays point tip of bottle toward eye on same side nostril and lean head slightly forward for best technique.   - if needed can you antihistamine like zyrtec, allegra or xyzal daily as needed  Follow-up in 1 year or sooner if needed

## 2023-06-21 NOTE — Progress Notes (Signed)
 Follow-up Note  RE: Trevor Rollins MRN: [id number] DOB: 08-04-1970 Date of Office Visit: 06/21/2023   History of present illness: Trevor Rollins is a 53 y.o. male presenting today for follow-up of allergic rhinitis.  He was last seen in the office on 03/30/2022 by Dr. Idolina Maker. Discussed the use of AI scribe software for clinical note transcription with the patient, who gave verbal consent to proceed.  He is currently on maintenance dosing for allergy  shots, which he started last year.  He has not had any large local or systemic reactions and tolerates injections well.  He has access to an epinephrine  device which she would like refilled today.  He does feel like he has done well over the past year and does note that this year seems to be better than last year regards to his allergy  control.  He does not take any oral allergy  medications but would like a nose spray. He primarily experiences rhinorrhea and frequently clears his throat. He has not used antihistamine nasal sprays like azelastine  or Astepro  before.  He does not have an inhaler and has not experienced any breathing issues or required breathing treatments in the past year.   Review of systems: 10pt ROS negative unless noted above in HPI  Past medical/social/surgical/family history have been reviewed and are unchanged unless specifically indicated below.  No changes  Medication List: Current Outpatient Medications  Medication Sig Dispense Refill   amLODipine  (NORVASC ) 10 MG tablet take 1 tablet by mouth once daily. 90 tablet 1   carvedilol  (COREG ) 3.125 MG tablet take (1) tablet twice daily. 180 tablet 1   citalopram  (CELEXA ) 20 MG tablet take 1 tablet once daily 90 tablet 3   diazepam  (VALIUM ) 5 MG tablet Take 1 tablet (5 mg total) by mouth 2 (two) times daily as needed. 30 tablet 3   losartan  (COZAAR ) 50 MG tablet take 1 tablet by mouth once daily. 90 tablet 1   No current facility-administered medications for this visit.      Known medication allergies: Allergies  Allergen Reactions   Penicillins Other (See Comments)    Childhood reaction "redness"   Has patient had a PCN reaction causing immediate rash, facial/tongue/throat swelling, SOB or lightheadedness with hypotension: No  Has patient had a PCN reaction causing severe rash involving mucus membranes or skin necrosis: No  Has patient had a PCN reaction that required hospitalization: No  Has patient had a PCN reaction occurring within the last 10 years: No  If all of the above answers are "NO", then may proceed with Cephalosporin use.  Childhood reaction "redness" , Has patient had a PCN reaction causing immediate rash, facial/tongue/throat swelling, SOB or lightheadedness with hypotension: No, Has patient had a PCN reaction causing severe rash involving mucus membranes or skin necrosis: No, Has patient had a PCN reaction that required hospitalization: No, Has patient had a PCN reaction occurring within the last 10 years: No, If all of the above answers are "NO", then may proceed with Cephalosporin use.     Physical examination: Blood pressure 122/80, pulse 94, temperature 98.2 F (36.8 C), resp. rate 18, height 6\' 1"  (1.854 m), weight 223 lb 6 oz (101.3 kg), SpO2 95%.  General: Alert, interactive, in no acute distress. HEENT: PERRLA, TMs pearly gray, turbinates non-edematous with clear discharge, post-pharynx non erythematous. Neck: Supple without lymphadenopathy. Lungs: Clear to auscultation without wheezing, rhonchi or rales. {no increased work of breathing. CV: Normal S1, S2 without murmurs. Abdomen: Nondistended, nontender. Skin:  Warm and dry, without lesions or rashes. Extremities:  No clubbing, cyanosis or edema. Neuro:   Grossly intact.  Diagnositics/Labs: None today  Assessment and plan:   Seasonal and perennial allergic rhinitis - continue avoidance measures for grasses, ragweed, weeds, trees, indoor molds, outdoor molds, dust  mites, cat, dog, and cockroach. - continue allergy  shots per schedule.  You are at maintenance dosing.  Continue to have access to epinephrine  device on your allergy  shot days.  - use nasal Astelin  2 sprays each nostril twice a day as needed for runny nose/throat clearing.  With using nasal sprays point tip of bottle toward eye on same side nostril and lean head slightly forward for best technique.   - if needed can you antihistamine like zyrtec, allegra or xyzal daily as needed  Follow-up in 1 year or sooner if needed   I appreciate the opportunity to take part in Melchizedek's care. Please do not hesitate to contact me with questions.  Sincerely,   Catha Clink, MD Allergy /Immunology [facility] 

## 2023-07-09 ENCOUNTER — Ambulatory Visit: Payer: Self-pay | Admitting: Family Medicine

## 2023-07-09 LAB — CBC
Hematocrit: 42.1 % (ref 37.5–51.0)
Hemoglobin: 14.2 g/dL (ref 13.0–17.7)
MCH: 34.3 pg — ABNORMAL HIGH (ref 26.6–33.0)
MCHC: 33.7 g/dL (ref 31.5–35.7)
MCV: 102 fL — ABNORMAL HIGH (ref 79–97)
Platelets: 246 10*3/uL (ref 150–450)
RBC: 4.14 x10E6/uL (ref 4.14–5.80)
RDW: 12.1 % (ref 11.6–15.4)
WBC: 3.7 10*3/uL (ref 3.4–10.8)

## 2023-07-09 LAB — CMP14+EGFR
ALT: 21 IU/L (ref 0–44)
AST: 27 IU/L (ref 0–40)
Albumin: 4.7 g/dL (ref 3.8–4.9)
Alkaline Phosphatase: 83 IU/L (ref 44–121)
BUN/Creatinine Ratio: 14 (ref 9–20)
BUN: 12 mg/dL (ref 6–24)
Bilirubin Total: 0.7 mg/dL (ref 0.0–1.2)
CO2: 24 mmol/L (ref 20–29)
Calcium: 10.2 mg/dL (ref 8.7–10.2)
Chloride: 98 mmol/L (ref 96–106)
Creatinine, Ser: 0.87 mg/dL (ref 0.76–1.27)
Globulin, Total: 2.8 g/dL (ref 1.5–4.5)
Glucose: 91 mg/dL (ref 70–99)
Potassium: 4.8 mmol/L (ref 3.5–5.2)
Sodium: 138 mmol/L (ref 134–144)
Total Protein: 7.5 g/dL (ref 6.0–8.5)
eGFR: 103 mL/min/{1.73_m2} (ref >59)

## 2023-07-09 LAB — LIPID PANEL
Chol/HDL Ratio: 2.2 ratio (ref 0.0–5.0)
Cholesterol, Total: 191 mg/dL (ref 100–199)
HDL: 85 mg/dL (ref >39)
LDL Chol Calc (NIH): 93 mg/dL (ref 0–99)
Triglycerides: 72 mg/dL (ref 0–149)
VLDL Cholesterol Cal: 13 mg/dL (ref 5–40)

## 2023-07-09 LAB — PSA: Prostate Specific Ag, Serum: 0.5 ng/mL (ref 0.0–4.0)

## 2023-07-13 ENCOUNTER — Other Ambulatory Visit: Payer: Self-pay

## 2023-07-13 ENCOUNTER — Ambulatory Visit: Admitting: Nurse Practitioner

## 2023-07-13 DIAGNOSIS — R718 Other abnormality of red blood cells: Secondary | ICD-10-CM

## 2023-07-15 ENCOUNTER — Encounter: Payer: Self-pay | Admitting: Family Medicine

## 2023-07-15 ENCOUNTER — Ambulatory Visit: Payer: BC Managed Care – PPO | Admitting: Family Medicine

## 2023-07-15 VITALS — BP 122/81 | HR 68 | Temp 98.4°F | Ht 73.0 in | Wt 228.0 lb

## 2023-07-15 DIAGNOSIS — Z23 Encounter for immunization: Secondary | ICD-10-CM

## 2023-07-15 DIAGNOSIS — I10 Essential (primary) hypertension: Secondary | ICD-10-CM | POA: Diagnosis not present

## 2023-07-15 DIAGNOSIS — E785 Hyperlipidemia, unspecified: Secondary | ICD-10-CM

## 2023-07-15 DIAGNOSIS — F411 Generalized anxiety disorder: Secondary | ICD-10-CM | POA: Diagnosis not present

## 2023-07-15 NOTE — Patient Instructions (Signed)
 Continue your medications.  Follow up in 6 months.  Be safe on your trips.

## 2023-07-18 MED ORDER — CITALOPRAM HYDROBROMIDE 20 MG PO TABS: 20.0000 mg | ORAL_TABLET | Freq: Every day | ORAL | 3 refills | Status: AC

## 2023-07-18 MED ORDER — AMLODIPINE BESYLATE 10 MG PO TABS: 10.0000 mg | ORAL_TABLET | Freq: Every day | ORAL | 3 refills | Status: AC

## 2023-07-18 MED ORDER — CARVEDILOL 3.125 MG PO TABS: ORAL_TABLET | ORAL | 3 refills | Status: AC

## 2023-07-18 NOTE — Assessment & Plan Note (Signed)
 Stable

## 2023-07-18 NOTE — Assessment & Plan Note (Signed)
 Stable on Celexa.  Continue.

## 2023-07-18 NOTE — Assessment & Plan Note (Signed)
 Stable.  Continue current medications.

## 2023-07-18 NOTE — Progress Notes (Signed)
 Subjective:  Patient ID: Trevor Rollins, male    DOB: 12-26-1970  Age: 53 y.o. MRN: [id number]  CC:   Chief Complaint  Patient presents with   Follow-up    6 month f/u hypertension , hyperlipidemia, low back pain     HPI:  53 year old male with the below mentioned medical problems presents for follow up.  He is feeling well.   HTN stable on Norvasc , Losartan , and Coreg .  Anxiety stable on Celexa .   Denies chest pain or SOB.   Amendable to pneumococcal vaccine today.    Labs reviewed. MCV elevated. Will proceed with B12 and Folate.  Patient Active Problem List   Diagnosis Date Noted   Erectile dysfunction 09/21/2022   Low back pain 07/30/2021   History of cardiomyopathy 03/21/2021   Eczema 03/21/2021   OSA (obstructive sleep apnea) 04/17/2020   RSD (reflex sympathetic dystrophy) 08/09/2019   Primary hypertension 09/08/2012   Hyperlipidemia 09/08/2012   Generalized anxiety disorder 09/08/2012    Social Hx   Social History   Socioeconomic History   Marital status: Married    Spouse name: Not on file   Number of children: Not on file   Years of education: Not on file   Highest education level: Not on file  Occupational History   Not on file  Tobacco Use   Smoking status: Never   Smokeless tobacco: Never  Vaping Use   Vaping status: Never Used  Substance and Sexual Activity   Alcohol use: Yes    Comment: occasional   Drug use: No   Sexual activity: Not on file  Other Topics Concern   Not on file  Social History Narrative   Not on file   Social Drivers of Health   Financial Resource Strain: Low Risk  (02/24/2023)   Received from [facility]   Overall Financial Resource Strain (CARDIA)    Difficulty of Paying Living Expenses: Not hard at all  Food Insecurity: No Food Insecurity (02/24/2023)   Received from [facility]   Hunger Vital Sign    Worried About Running Out of Food in the Last Year: Never true    Ran Out of Food in the Last Year: Never  true  Transportation Needs: No Transportation Needs (02/24/2023)   Received from [facility] - Transportation    Lack of Transportation (Medical): No    Lack of Transportation (Non-Medical): No  Physical Activity: Not on file  Stress: Not on file  Social Connections: Unknown (11/30/2022)   Received from [facility]   Social Network    Social Network: Not on file    Review of Systems Per HPI  Objective:  BP 122/81   Pulse 68   Temp 98.4 F (36.9 C)   Ht 6\' 1"  (1.854 m)   Wt 228 lb (103.4 kg)   SpO2 99%   BMI 30.08 kg/m      07/15/2023    1:40 PM 06/21/2023   10:11 AM 05/28/2023    3:07 PM  BP/Weight  Systolic BP 122 122 126  Diastolic BP 81 80 82  Wt. (Lbs) 228 223.38 227.4  BMI 30.08 kg/m2 29.47 kg/m2 30 kg/m2    Physical Exam Vitals and nursing note reviewed.  Constitutional:      General: He is not in acute distress.    Appearance: Normal appearance.  HENT:     Head: Normocephalic and atraumatic.  Eyes:     General:  Right eye: No discharge.        Left eye: No discharge.     Conjunctiva/sclera: Conjunctivae normal.  Cardiovascular:     Rate and Rhythm: Normal rate and regular rhythm.  Pulmonary:     Effort: Pulmonary effort is normal.     Breath sounds: Normal breath sounds. No wheezing, rhonchi or rales.  Neurological:     Mental Status: He is alert.  Psychiatric:        Mood and Affect: Mood normal.        Behavior: Behavior normal.     Lab Results  Component Value Date   WBC 3.7 07/08/2023   HGB 14.2 07/08/2023   HCT 42.1 07/08/2023   PLT 246 07/08/2023   GLUCOSE 91 07/08/2023   CHOL 191 07/08/2023   TRIG 72 07/08/2023   HDL 85 07/08/2023   LDLCALC 93 07/08/2023   ALT 21 07/08/2023   AST 27 07/08/2023   NA 138 07/08/2023   K 4.8 07/08/2023   CL 98 07/08/2023   CREATININE 0.87 07/08/2023   BUN 12 07/08/2023   CO2 24 07/08/2023   INR 0.97 12/24/2016   HGBA1C 4.8 07/30/2021     Assessment & Plan:  Primary  hypertension Assessment & Plan: Stable. Continue current medications.   Orders: -     amLODIPine  Besylate; Take 1 tablet (10 mg total) by mouth daily.  Dispense: 90 tablet; Refill: 3 -     Carvedilol ; Take 1 tablet twice daily.  Dispense: 180 tablet; Refill: 3  Immunization due -     Pneumococcal conjugate vaccine 20-valent  Hyperlipidemia, unspecified hyperlipidemia type Assessment & Plan: Stable.   Generalized anxiety disorder Assessment & Plan: Stable on Celexa . Continue.  Orders: -     Citalopram  Hydrobromide; Take 1 tablet (20 mg total) by mouth daily.  Dispense: 90 tablet; Refill: 3    Follow-up:  6 months.  Kathleen Papa DO [facility]

## 2023-07-23 ENCOUNTER — Ambulatory Visit (INDEPENDENT_AMBULATORY_CARE_PROVIDER_SITE_OTHER): Payer: Self-pay

## 2023-07-23 DIAGNOSIS — J309 Allergic rhinitis, unspecified: Secondary | ICD-10-CM | POA: Diagnosis not present

## 2023-09-10 ENCOUNTER — Ambulatory Visit (INDEPENDENT_AMBULATORY_CARE_PROVIDER_SITE_OTHER)

## 2023-09-10 DIAGNOSIS — J309 Allergic rhinitis, unspecified: Secondary | ICD-10-CM

## 2023-09-17 ENCOUNTER — Ambulatory Visit (INDEPENDENT_AMBULATORY_CARE_PROVIDER_SITE_OTHER): Payer: Self-pay

## 2023-09-17 DIAGNOSIS — J309 Allergic rhinitis, unspecified: Secondary | ICD-10-CM

## 2023-10-12 NOTE — Progress Notes (Deleted)
 SABRA

## 2023-10-14 ENCOUNTER — Ambulatory Visit: Payer: BC Managed Care – PPO | Admitting: Neurology

## 2023-10-25 ENCOUNTER — Other Ambulatory Visit: Payer: Self-pay

## 2023-10-25 ENCOUNTER — Encounter (HOSPITAL_COMMUNITY): Payer: Self-pay | Admitting: Emergency Medicine

## 2023-10-25 ENCOUNTER — Emergency Department (HOSPITAL_COMMUNITY)
Admission: EM | Admit: 2023-10-25 | Discharge: 2023-10-25 | Disposition: A | Discharge status: Home / Self Care | Attending: Emergency Medicine | Admitting: Emergency Medicine

## 2023-10-25 DIAGNOSIS — G8918 Other acute postprocedural pain: Secondary | ICD-10-CM | POA: Diagnosis not present

## 2023-10-25 DIAGNOSIS — M62830 Muscle spasm of back: Secondary | ICD-10-CM | POA: Diagnosis not present

## 2023-10-25 DIAGNOSIS — M549 Dorsalgia, unspecified: Secondary | ICD-10-CM | POA: Diagnosis present

## 2023-10-25 MED ORDER — DIAZEPAM 2 MG PO TABS
2.0000 mg | ORAL_TABLET | Freq: Once | ORAL | Status: AC
Start: 2023-10-25 — End: 2023-10-25
  Administered 2023-10-25: 2 mg via ORAL
  Filled 2023-10-25: qty 1

## 2023-10-25 MED ORDER — DIAZEPAM 5 MG/ML IJ SOLN
5.0000 mg | Freq: Once | INTRAMUSCULAR | Status: AC
  Administered 2023-10-25: 5 mg via INTRAVENOUS
  Filled 2023-10-25: qty 2

## 2023-10-25 MED ORDER — LIDOCAINE 5 % EX PTCH
1.0000 | MEDICATED_PATCH | CUTANEOUS | Status: DC
  Administered 2023-10-25: 1 via TRANSDERMAL
  Filled 2023-10-25: qty 1

## 2023-10-25 MED ORDER — KETOROLAC TROMETHAMINE 15 MG/ML IJ SOLN
15.0000 mg | Freq: Once | INTRAMUSCULAR | Status: AC
  Administered 2023-10-25: 15 mg via INTRAVENOUS
  Filled 2023-10-25: qty 1

## 2023-10-25 MED ORDER — LOPERAMIDE HCL 2 MG PO CAPS
4.0000 mg | ORAL_CAPSULE | Freq: Once | ORAL | Status: AC
  Administered 2023-10-25: 4 mg via ORAL
  Filled 2023-10-25: qty 2

## 2023-10-25 MED ORDER — LIDOCAINE 4 % EX PTCH: 1.0000 | MEDICATED_PATCH | Freq: Two times a day (BID) | CUTANEOUS | 0 refills | Status: AC

## 2023-10-25 NOTE — ED Triage Notes (Signed)
 Pt bib rcems w/ c/o back pain. Pt had a L5-S1 fusion x 2 weeks. Since that time pain has become progressively worse. Pt was attempting to use the toilet this morning and was unable to get up. EMS reported that with assistance pt was able to bear some weight. Pulses and sensation is present at baseline X 4 extremities. EMS reports incision is clean and dry with no signs of infection. Pt received 75mcg of fentanyl  on truck with ems. Pt is a&o x 4

## 2023-10-25 NOTE — Discharge Instructions (Addendum)
 Please perform simple/mild stretching exercises as we discussed. Apply ice for the next 24 hours.  20-30 min at a time, multiple times a day.  Tomorrow you can start alternating between ice and heat.  Expect a call from your neurosurgery team for a closer follow-up.

## 2023-10-25 NOTE — ED Provider Notes (Signed)
 [place] EMERGENCY DEPARTMENT AT [facility] Provider Note   CSN: [id number] Arrival date & time: 10/25/23  9294     Patient presents with: Back Pain   Trevor Rollins is a 53 y.o. male.   HPI     53 year old patient comes in with chief complaint of back pain.  Patient is status post lumbar spine fusion from 8-25 with [facility].  Patient states that he has had severe spasms ever since the surgery.  He has history of RSD, CRPD.  Patient denies any new urinary incontinence, retention, numbness or tingling in the legs, weakness in the legs.  Pain is responding to Norco, Robaxin , but worse with any activity.  Patient's next neurosurgery appointment is on 25th.  He does not think he can wait.  He had called neurosurgery postop, he was put on Robaxin , Medrol  Dosepak which he is taking.  Patient is on Norco.  Overall, symptoms are responding to medicine, but anytime he moves that he has excruciating discomfort.  Wife is at the bedside.  Wound appears to be healing well.  No fevers, chills.  Prior to Admission medications   Medication Sig Start Date End Date Taking? Authorizing Provider  amLODipine  (NORVASC ) 10 MG tablet Take 1 tablet (10 mg total) by mouth daily. 07/18/23   Cook, Jayce G, DO  azelastine  (ASTELIN ) 0.1 % nasal spray Place 2 sprays into both nostrils 2 (two) times daily as needed (runny nose/throat clearing). 06/21/23   Jeneal Danita Macintosh, MD  carvedilol  (COREG ) 3.125 MG tablet Take 1 tablet twice daily. 07/18/23   Cook, Jayce G, DO  citalopram  (CELEXA ) 20 MG tablet Take 1 tablet (20 mg total) by mouth daily. 07/18/23   Cook, Jayce G, DO  EPINEPHrine  (EPIPEN  2-PAK) 0.3 mg/0.3 mL IJ SOAJ injection Inject 0.3 mg into the muscle as needed for anaphylaxis. 06/21/23   Jeneal Danita Macintosh, MD  losartan  (COZAAR ) 50 MG tablet take 1 tablet by mouth once daily. 05/31/23   Alvan Dorn FALCON, MD    Allergies: Penicillins    Review of Systems  All other systems  reviewed and are negative.   Updated Vital Signs BP 128/78 (BP Location: Left Arm)   Pulse 89   Temp 98.2 F (36.8 C) (Oral)   Resp 18   Ht 6' 1 (1.854 m)   Wt 104.3 kg   SpO2 95%   BMI 30.34 kg/m   Physical Exam Vitals and nursing note reviewed.  Constitutional:      Appearance: He is well-developed.  HENT:     Head: Atraumatic.  Eyes:     Extraocular Movements: Extraocular movements intact.     Pupils: Pupils are equal, round, and reactive to light.  Cardiovascular:     Rate and Rhythm: Normal rate.  Pulmonary:     Effort: Pulmonary effort is normal.  Musculoskeletal:     Cervical back: Neck supple.     Comments: Reproducible tenderness over the left lower lateral back, tenderness also provoked by passive leg raise and attempts at flexing the hip  Skin:    General: Skin is warm.  Neurological:     Mental Status: He is alert and oriented to person, place, and time.     (all labs ordered are listed, but only abnormal results are displayed) Labs Reviewed - No data to display  EKG: None  Radiology: No results found.   Procedures   Medications Ordered in the ED  diazepam  (VALIUM ) injection 5 mg (has no administration in  time range)  lidocaine  (LIDODERM ) 5 % 1 patch (has no administration in time range)  ketorolac  (TORADOL ) 15 MG/ML injection 15 mg (has no administration in time range)  loperamide  (IMODIUM ) capsule 4 mg (has no administration in time range)                                    Medical Decision Making Risk Prescription drug management.   53 year old male comes in with chief complaint of back pain. Patient is postop from lumbar spine fusion on 8-25.  Subsequently, he has suffered through spasms.  He has history of RSD, CRPD.  I reviewed care everywhere including patient's op note.  Exam overall concerning for mostly spasm and localized discomfort.  Differential that was considered includes hematoma, seroma, infection, but that does not  appear to be the case.  Discussed with the patient need for him to continue to do some mild stretching and alternate between ice and heat.  I consulted neurosurgery and spoke with APP covering for Dr. Lucius. They will call the patient later today with a closer follow-up appointment.  Additionally, during that conversation they will discuss if patient needs different muscle relaxant and Robaxin  and if Medrol  Dosepak needs to be represcribed.  In the ER, will give patient Valium  and Toradol .  EMS gave patient fentanyl .  Final diagnoses:  Muscle spasm of back  Acute post-operative pain    ED Discharge Orders     None          Charlyn Sora, MD 10/25/23 [phone]

## 2023-11-16 ENCOUNTER — Ambulatory Visit: Payer: Self-pay

## 2023-11-16 ENCOUNTER — Ambulatory Visit: Payer: Self-pay | Admitting: Family Medicine

## 2023-11-16 NOTE — Telephone Encounter (Signed)
 Patient was already triaged today. Patient was scheduled for an appointment today-was told the appointment would be phone but per chart the visit is listed as in person. Patient called to make a new appointment. Scheduled for tomorrow for in person visit for 10:40 AM with PCP. Patient repeated back to Nurse Triage that the appointment would be in person.   Copied from CRM #[id number]. Topic: Clinical - Red Word Triage >> Nov 16, 2023  2:54 PM Fonda T wrote: [facility] that prompted transfer to Nurse Triage: Received call from patient, due to increased anxiety.  Appointment was scheduled with provider, however patient thought appointment was virtual as per patient reports that's what he was told at time of scheduling.   Per CAL, this appointment type needs to be in person.

## 2023-11-16 NOTE — Telephone Encounter (Signed)
 Appointment scheduled.

## 2023-11-16 NOTE — Telephone Encounter (Signed)
 FYI Only or Action Required?: Action required by provider: request for appointment.  Patient was last seen in primary care on 07/15/2023 by Cook, Jayce G, DO.  Called Nurse Triage reporting Anxiety.  Symptoms began several days ago.  Interventions attempted: Nothing.  Symptoms are: unchanged. Recent back surgery, and his work/clients causing stress, anxiety. Denies any thoughts of self harm. Appointment made.  Triage Disposition: See Physician Within 24 Hours  Patient/caregiver understands and will follow disposition?: Yes     Copied from CRM #[id number]. Topic: Clinical - Medication Question >> Nov 16, 2023 11:24 AM Tiffini S wrote: Reason for CRM: Patient  had a major back surgery- he is having anxiety/ panic attack Answer Assessment - Initial Assessment Questions 1. CONCERN: Did anything happen that prompted you to call today?      anxiety 2. ANXIETY SYMPTOMS: Can you describe how you (your loved one; patient) have been feeling? (e.g., tense, restless, panicky, anxious, keyed up, overwhelmed, sense of impending doom).      overwhelmed 3. ONSET: How long have you been feeling this way? (e.g., hours, days, weeks)     weeks 4. SEVERITY: How would you rate the level of anxiety? (e.g., 0 - 10; or mild, moderate, severe).     moderate 5. FUNCTIONAL IMPAIRMENT: How have these feelings affected your ability to do daily activities? Have you had more difficulty than usual doing your normal daily activities? (e.g., getting better, same, worse; self-care, school, work, interactions)     Work is causing 6. HISTORY: Have you felt this way before? Have you ever been diagnosed with an anxiety problem in the past? (e.g., generalized anxiety disorder, panic attacks, PTSD). If Yes, ask: How was this problem treated? (e.g., medicines, counseling, etc.)     panic 7. RISK OF HARM - SUICIDAL IDEATION: Do you ever have thoughts of hurting or killing yourself? If Yes, ask:  Do you have  these feelings now? Do you have a plan on how you would do this?     no 8. TREATMENT:  What has been done so far to treat this anxiety? (e.g., medicines, relaxation strategies). What has helped?     no 9. THERAPIST: Do you have a counselor or therapist? If Yes, ask: What is their name?     no 10. POTENTIAL TRIGGERS: Do you drink caffeinated beverages (e.g., coffee, colas, teas), and how much daily? Do you drink alcohol or use any drugs? Have you started any new medicines recently?       no 11. PATIENT SUPPORT: Who is with you now? Who do you live with? Do you have family or friends who you can talk to?        family 39. OTHER SYMPTOMS: Do you have any other symptoms? (e.g., feeling depressed, trouble concentrating, trouble sleeping, trouble breathing, palpitations or fast heartbeat, chest pain, sweating, nausea, or diarrhea)       Fast heart rate 13. PREGNANCY: Is there any chance you are pregnant? When was your last menstrual period?       N/a  Protocols used: Anxiety and Panic Attack-A-AH

## 2023-11-17 ENCOUNTER — Encounter: Payer: Self-pay | Admitting: Family Medicine

## 2023-11-17 ENCOUNTER — Ambulatory Visit: Payer: Self-pay | Admitting: Family Medicine

## 2023-11-17 VITALS — BP 143/89 | HR 116 | Ht 73.0 in | Wt 235.0 lb

## 2023-11-17 DIAGNOSIS — F411 Generalized anxiety disorder: Secondary | ICD-10-CM | POA: Diagnosis not present

## 2023-11-17 DIAGNOSIS — Z23 Encounter for immunization: Secondary | ICD-10-CM

## 2023-11-17 MED ORDER — DIAZEPAM 5 MG PO TABS: 5.0000 mg | ORAL_TABLET | Freq: Two times a day (BID) | ORAL | 1 refills | Status: DC | PRN

## 2023-11-17 NOTE — Patient Instructions (Signed)
 Medication as prescribed.  Take care  Dr. Adriana Simas

## 2023-11-18 NOTE — Progress Notes (Signed)
 Subjective:  Patient ID: Trevor Rollins, male    DOB: 1970-09-26  Age: 53 y.o. 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 Having attacks where he is having tremors, hyperventilating, hot and cold sweats    HPI:  53 year old male presents for evaluation of the above.  Has had recent surgery and has had to decrease his work hours. 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 This has created a lot of anxiety. Has led to panic attacks - tremulousness, hot and cold sweats, hyperventilating.  He is compliant with Celexa . Would like to discuss other treatments today.  Patient Active Problem List   Diagnosis Date Noted   Erectile dysfunction 09/21/2022   Low back pain 07/30/2021   History of cardiomyopathy 03/21/2021   Eczema 03/21/2021   OSA (obstructive sleep apnea) 04/17/2020   RSD (reflex sympathetic dystrophy) 08/09/2019   Primary hypertension 09/08/2012   Hyperlipidemia 09/08/2012   Generalized anxiety disorder 09/08/2012    Social Hx   Social History   Socioeconomic History   Marital status: Married    Spouse name: Not on file   Number of children: Not on file   Years of education: Not on file   Highest education level: Not on file  Occupational History   Not on file  Tobacco Use   Smoking status: Never   Smokeless tobacco: Never  Vaping Use   Vaping status: Never Used  Substance and Sexual Activity   Alcohol use: Yes    Comment: occasional   Drug use: No   Sexual activity: Not on file  Other Topics Concern   Not on file  Social History Narrative   Not on file   Social Drivers of Health   Financial Resource Strain: Low Risk  (02/24/2023)   Received from [facility]   Overall Financial Resource Strain (CARDIA)    Difficulty of Paying Living Expenses: Not hard at all  Food Insecurity: No Food  Insecurity (10/04/2023)   Received from [facility]   Hunger Vital Sign    Within the past 12 months, you worried that your food would run out before you got the money to buy more.: Never true    Within the past 12 months, the food you bought just didn't last and you didn't have money to get more.: Never true  Transportation Needs: No Transportation Needs (10/04/2023)   Received from [facility] - Transportation    In the past 12 months, has lack of transportation kept you from medical appointments or from getting medications?: No    In the past 12 months, has lack of transportation kept you from meetings, work, or from getting things needed for daily living?: No  Physical Activity: Not on file  Stress: No Stress Concern Present (10/04/2023)   Received from [facility] - Occupational Stress Questionnaire    Do you feel stress - tense, restless, nervous, or anxious, or unable to sleep at night because your mind is troubled all the time - these days?: Not at all  Social Connections: Unknown (11/30/2022)   Received from [facility]   Social Network    Social Network: Not on file    Review of Systems Per HPI  Objective:  BP ROLLEN)  143/89   Pulse (!) 116   Ht 6' 1 (1.854 m)   Wt 235 lb (106.6 kg)   SpO2 96%   BMI 31.00 kg/m      11/17/2023   10:35 AM 10/25/2023   10:10 AM 10/25/2023    7:40 AM  BP/Weight  Systolic BP 143 135 128  Diastolic BP 89 88 78  Wt. (Lbs) 235    BMI 31 kg/m2      Physical Exam Vitals and nursing note reviewed.  Constitutional:      General: He is not in acute distress.    Appearance: Normal appearance.  HENT:     Head: Normocephalic and atraumatic.  Cardiovascular:     Rate and Rhythm: Normal rate and regular rhythm.  Pulmonary:     Effort: Pulmonary effort is normal.     Breath sounds: Normal breath sounds.  Neurological:     Mental Status: He is alert.  Psychiatric:     Comments:  Anxious.     Lab Results  Component Value Date   WBC 3.7 07/08/2023   HGB 14.2 07/08/2023   HCT 42.1 07/08/2023   PLT 246 07/08/2023   GLUCOSE 91 07/08/2023   CHOL 191 07/08/2023   TRIG 72 07/08/2023   HDL 85 07/08/2023   LDLCALC 93 07/08/2023   ALT 21 07/08/2023   AST 27 07/08/2023   NA 138 07/08/2023   K 4.8 07/08/2023   CL 98 07/08/2023   CREATININE 0.87 07/08/2023   BUN 12 07/08/2023   CO2 24 07/08/2023   INR 0.97 12/24/2016   HGBA1C 4.8 07/30/2021     Assessment & Plan:  Generalized anxiety disorder Assessment & Plan: Chronic. Recent exacerbation/worsening. Continue Celexa . PRN Valium  as directed.  Orders: -     diazePAM ; Take 1 tablet (5 mg total) by mouth every 12 (twelve) hours as needed for anxiety or muscle spasms.  Dispense: 60 tablet; Refill: 1  Encounter for immunization -     Flu vaccine trivalent PF, 6mos and older(Flulaval,Afluria,Fluarix,Fluzone)    Follow-up:  Return if symptoms worsen or fail to improve.  Jacqulyn Ahle DO [facility]

## 2023-11-18 NOTE — Assessment & Plan Note (Signed)
 Chronic. Recent exacerbation/worsening. Continue Celexa . PRN Valium  as directed.

## 2023-12-31 ENCOUNTER — Ambulatory Visit: Admitting: Family Medicine

## 2023-12-31 ENCOUNTER — Ambulatory Visit (INDEPENDENT_AMBULATORY_CARE_PROVIDER_SITE_OTHER): Payer: Self-pay

## 2023-12-31 ENCOUNTER — Encounter: Payer: Self-pay | Admitting: Family Medicine

## 2023-12-31 ENCOUNTER — Other Ambulatory Visit: Payer: Self-pay

## 2023-12-31 VITALS — BP 122/82 | HR 96 | Temp 98.6°F | Resp 18 | Ht 73.0 in | Wt 234.6 lb

## 2023-12-31 DIAGNOSIS — J3089 Other allergic rhinitis: Secondary | ICD-10-CM | POA: Diagnosis not present

## 2023-12-31 DIAGNOSIS — J302 Other seasonal allergic rhinitis: Secondary | ICD-10-CM | POA: Diagnosis not present

## 2023-12-31 DIAGNOSIS — J309 Allergic rhinitis, unspecified: Secondary | ICD-10-CM | POA: Diagnosis not present

## 2023-12-31 NOTE — Progress Notes (Signed)
 [address] [place] [address] Dept: [phone]  FOLLOW UP NOTE  Patient ID: Trevor Rollins, male    DOB: Jan 25, 1971  Age: 53 y.o. MRN: [id number] Date of Office Visit: 12/31/2023  Assessment  Chief Complaint: Follow-up (Pt presents to the office to restart allergy  injections.)  HPI Trevor Rollins is a 53 year old male who presents to the clinic for follow-up visit.  He was last seen in this clinic on 06/21/2023 by Dr. Jeneal for evaluation of allergic rhinitis.  He began allergen immunotherapy directed toward grass pollen, weed pollen, tree pollen, ragweed pollen, dust mite, cat, dog, and mold and cockroach prior to 2016.  On 01/28/2023 he began rash directed toward grass pollen, weed pollen, tree pollen, ragweed pollen, dust mite, cat, dog, and mold with his last injection on 09/17/2023. In the interim, he reports that he has been traveling and also had lumbar laminectomy on 10/04/2023.  Discussed the use of AI scribe software for clinical note transcription with the patient, who gave verbal consent to proceed.  History of Present Illness Trevor Rollins is a 53 year old male who presents for resumption of allergy  injections after a three-month hiatus.  He has had a three-month gap in his allergy  injections, last received on September 17, 2023, due to international travel and subsequent back surgery. He wants to resume his allergy  shots, which he previously received monthly. Since starting the injections, he has not experienced sinus infections, which he used to have twice a year. EpiPen  set is up to date.   His current allergy  symptoms include rhinorrhea without significant nasal congestion or sneezing. No post-nasal drip is present. He has been using a nasal spray, possibly Flonase  or azelastine , but has not been taking an antihistamine. He also has a nasal rinse but uses it only when symptoms are severe.  He underwent back surgery three months ago, which involved fusion at L5  S1. He reports significant improvement in nerve pain post-surgery and is currently recovering from muscle atrophy. He was on a walker three weeks ago and is undergoing physical therapy.  His current medications are listed in the chart.    Drug Allergies:  Allergies  Allergen Reactions   Penicillins Other (See Comments)    Childhood reaction redness   Has patient had a PCN reaction causing immediate rash, facial/tongue/throat swelling, SOB or lightheadedness with hypotension: No  Has patient had a PCN reaction causing severe rash involving mucus membranes or skin necrosis: No  Has patient had a PCN reaction that required hospitalization: No  Has patient had a PCN reaction occurring within the last 10 years: No  If all of the above answers are NO, then may proceed with Cephalosporin use.  Childhood reaction redness , Has patient had a PCN reaction causing immediate rash, facial/tongue/throat swelling, SOB or lightheadedness with hypotension: No, Has patient had a PCN reaction causing severe rash involving mucus membranes or skin necrosis: No, Has patient had a PCN reaction that required hospitalization: No, Has patient had a PCN reaction occurring within the last 10 years: No, If all of the above answers are NO, then may proceed with Cephalosporin use.    Physical Exam: BP 122/82 (BP Location: Left Arm, Patient Position: Sitting, Cuff Size: Normal)   Pulse 96   Temp 98.6 F (37 C) (Temporal)   Resp 18   Ht 6' 1 (1.854 m)   Wt 234 lb 9.6 oz (106.4 kg)   SpO2 96%   BMI 30.95 kg/m  Physical Exam Vitals reviewed.  Constitutional:      Appearance: Normal appearance.  HENT:     Head: Normocephalic and atraumatic.     Right Ear: Tympanic membrane normal.     Left Ear: Tympanic membrane normal.     Nose:     Comments: Bilateral nares slightly erythematous with thin clear nasal drainage noted. Pahrynx normal. Ears normal. Eyes normal.    Mouth/Throat:     Pharynx:  Oropharynx is clear.  Eyes:     Conjunctiva/sclera: Conjunctivae normal.  Cardiovascular:     Rate and Rhythm: Normal rate and regular rhythm.     Heart sounds: Normal heart sounds. No murmur heard. Pulmonary:     Effort: Pulmonary effort is normal.     Breath sounds: Normal breath sounds.     Comments: Lungs clear to auscultation Musculoskeletal:        General: Normal range of motion.     Cervical back: Normal range of motion and neck supple.  Skin:    General: Skin is warm and dry.  Neurological:     Mental Status: He is alert and oriented to person, place, and time.  Psychiatric:        Mood and Affect: Mood normal.        Behavior: Behavior normal.        Thought Content: Thought content normal.        Judgment: Judgment normal.     Assessment and Plan: 1. Seasonal and perennial allergic rhinitis     Patient Instructions  Allergic rhinitis Continue allergen avoidance measures directed toward grass pollen, weed pollen, ragweed pollen, tree pollen, dust mite, cat, dog, and mold as listed below Continue an antihistamine once a day if needed for runny nose or itch Continue azelastine  2 sprays in each nostril up to twice a day if needed for nasal symptoms Consider saline nasal rinses as needed for nasal symptoms. Use this before any medicated nasal sprays for best result Continue allergy  immunotherapy and have access to an Epipen   Call the clinic if this treatment plan is not working well for you.  Follow up in 6 months or sooner if needed.  Return in about 6 months (around 06/29/2024), or if symptoms worsen or fail to improve.    Thank you for the opportunity to care for this patient.  Please do not hesitate to contact me with questions.  Arlean Mutter, FNP [facility] 

## 2023-12-31 NOTE — Patient Instructions (Signed)
 Allergic rhinitis Continue allergen avoidance measures directed toward grass pollen, weed pollen, ragweed pollen, tree pollen, dust mite, cat, dog, and mold as listed below Continue an antihistamine once a day if needed for runny nose or itch Continue azelastine  2 sprays in each nostril up to twice a day if needed for nasal symptoms Consider saline nasal rinses as needed for nasal symptoms. Use this before any medicated nasal sprays for best result Continue allergy  immunotherapy and have access to an Epipen   Call the clinic if this treatment plan is not working well for you.  Follow up in 6 months or sooner if needed.  Reducing Pollen Exposure The American Academy of Allergy , Asthma and Immunology suggests the following steps to reduce your exposure to pollen during allergy  seasons. Do not hang sheets or clothing out to dry; pollen may collect on these items. Do not mow lawns or spend time around freshly cut grass; mowing stirs up pollen. Keep windows closed at night.  Keep car windows closed while driving. Minimize morning activities outdoors, a time when pollen counts are usually at their highest. Stay indoors as much as possible when pollen counts or humidity is high and on windy days when pollen tends to remain in the air longer. Use air conditioning when possible.  Many air conditioners have filters that trap the pollen spores. Use a HEPA room air filter to remove pollen form the indoor air you breathe.  Control of Mold Allergen Mold and fungi can grow on a variety of surfaces provided certain temperature and moisture conditions exist.  Outdoor molds grow on plants, decaying vegetation and soil.  The major outdoor mold, Alternaria and Cladosporium, are found in very high numbers during hot and dry conditions.  Generally, a late Summer - Fall peak is seen for common outdoor fungal spores.  Rain will temporarily lower outdoor mold spore count, but counts rise rapidly when the rainy period  ends.  The most important indoor molds are Aspergillus and Penicillium.  Dark, humid and poorly ventilated basements are ideal sites for mold growth.  The next most common sites of mold growth are the bathroom and the kitchen.  Outdoor [employer] Use air conditioning and keep windows closed Avoid exposure to decaying vegetation. Avoid leaf raking. Avoid grain handling. Consider wearing a face mask if working in moldy areas.  Indoor Mold Control Maintain humidity below 50%. Clean washable surfaces with 5% bleach solution. Remove sources e.g. Contaminated carpets.   Control of Dust Mite Allergen Dust mites play a major role in allergic asthma and rhinitis. They occur in environments with high humidity wherever human skin is found. Dust mites absorb humidity from the atmosphere (ie, they do not drink) and feed on organic matter (including shed human and animal skin). Dust mites are a microscopic type of insect that you cannot see with the naked eye. High levels of dust mites have been detected from mattresses, pillows, carpets, upholstered furniture, bed covers, clothes, soft toys and any woven material. The principal allergen of the dust mite is found in its feces. A gram of dust may contain 1,000 mites and 250,000 fecal particles. Mite antigen is easily measured in the air during house cleaning activities. Dust mites do not bite and do not cause harm to humans, other than by triggering allergies/asthma.  Ways to decrease your exposure to dust mites in your home:  1. Encase mattresses, box springs and pillows with a mite-impermeable barrier or cover  2. Wash sheets, blankets and drapes weekly in  hot water (130 F) with detergent and dry them in a dryer on the hot setting.  3. Have the room cleaned frequently with a vacuum cleaner and a damp dust-mop. For carpeting or rugs, vacuuming with a vacuum cleaner equipped with a high-efficiency particulate air (HEPA) filter. The dust mite allergic  individual should not be in a room which is being cleaned and should wait 1 hour after cleaning before going into the room.  4. Do not sleep on upholstered furniture (eg, couches).  5. If possible removing carpeting, upholstered furniture and drapery from the home is ideal. Horizontal blinds should be eliminated in the rooms where the person spends the most time (bedroom, study, television room). Washable vinyl, roller-type shades are optimal.  6. Remove all non-washable stuffed toys from the bedroom. Wash stuffed toys weekly like sheets and blankets above.  7. Reduce indoor humidity to less than 50%. Inexpensive humidity monitors can be purchased at most hardware stores. Do not use a humidifier as can make the problem worse and are not recommended.  Bilin mold dust mite  Control of Dog or Cat Allergen Avoidance is the best way to manage a dog or cat allergy . If you have a dog or cat and are allergic to dog or cats, consider removing the dog or cat from the home. If you have a dog or cat but don't want to find it a new home, or if your family wants a pet even though someone in the household is allergic, here are some strategies that may help keep symptoms at bay:  Keep the pet out of your bedroom and restrict it to only a few rooms. Be advised that keeping the dog or cat in only one room will not limit the allergens to that room. Don't pet, hug or kiss the dog or cat; if you do, wash your hands with soap and water. High-efficiency particulate air (HEPA) cleaners run continuously in a bedroom or living room can reduce allergen levels over time. Regular use of a high-efficiency vacuum cleaner or a central vacuum can reduce allergen levels. Giving your dog or cat a bath at least once a week can reduce airborne allergen.

## 2024-01-12 ENCOUNTER — Ambulatory Visit: Payer: Self-pay | Admitting: Family Medicine

## 2024-01-12 ENCOUNTER — Encounter: Payer: Self-pay | Admitting: Family Medicine

## 2024-01-12 ENCOUNTER — Ambulatory Visit

## 2024-01-12 VITALS — BP 143/84 | HR 101 | Ht 73.5 in | Wt 240.0 lb

## 2024-01-12 DIAGNOSIS — J309 Allergic rhinitis, unspecified: Secondary | ICD-10-CM | POA: Diagnosis not present

## 2024-01-12 DIAGNOSIS — F411 Generalized anxiety disorder: Secondary | ICD-10-CM

## 2024-01-12 DIAGNOSIS — G8929 Other chronic pain: Secondary | ICD-10-CM

## 2024-01-12 MED ORDER — PREDNISONE 10 MG PO TABS: ORAL_TABLET | ORAL | 0 refills | Status: AC

## 2024-01-12 NOTE — Patient Instructions (Addendum)
Medication as needed. ° °Take care ° °Dr. Durward Matranga  °

## 2024-01-12 NOTE — Progress Notes (Signed)
 Subjective:  Patient ID: Trevor Rollins, male    DOB: July 12, 1970  Age: 53 y.o. MRN: [id number]  CC:   Chief Complaint  Patient presents with   Medication Refill    Refill on prednisone , will be going out of town and having to do a lot of walking    HPI:  53 year old male presents for follow up.  Doing well following recent lumbar surgery.  He would like to have prednisone  on hand in case it is needed for back pain (has upcoming travel).   Anxiety improved.   Patient Active Problem List   Diagnosis Date Noted   Erectile dysfunction 09/21/2022   Low back pain 07/30/2021   History of cardiomyopathy 03/21/2021   Eczema 03/21/2021   OSA (obstructive sleep apnea) 04/17/2020   RSD (reflex sympathetic dystrophy) 08/09/2019   Primary hypertension 09/08/2012   Hyperlipidemia 09/08/2012   Generalized anxiety disorder 09/08/2012    Social Hx   Social History   Socioeconomic History   Marital status: Married    Spouse name: Not on file   Number of children: Not on file   Years of education: Not on file   Highest education level: Not on file  Occupational History   Not on file  Tobacco Use   Smoking status: Never   Smokeless tobacco: Never  Vaping Use   Vaping status: Never Used  Substance and Sexual Activity   Alcohol use: Yes    Comment: occasional   Drug use: No   Sexual activity: Not on file  Other Topics Concern   Not on file  Social History Narrative   Not on file   Social Drivers of Health   Financial Resource Strain: Low Risk  (02/24/2023)   Received from [facility]   Overall Financial Resource Strain (CARDIA)    Difficulty of Paying Living Expenses: Not hard at all  Food Insecurity: No Food Insecurity (10/04/2023)   [facility]   Hunger Vital Sign    Within the past 12 months, you worried that your food would run out before you got the money to buy more.: Never true    Within the past 12 months, the food you bought just didn't last and  you didn't have money to get more.: Never true  Transportation Needs: No Transportation Needs (10/04/2023)   Received from [facility] - Transportation    In the past 12 months, has lack of transportation kept you from medical appointments or from getting medications?: No    In the past 12 months, has lack of transportation kept you from meetings, work, or from getting things needed for daily living?: No  Physical Activity: Not on file  Stress: No Stress Concern Present (10/04/2023)   Received from [facility] - Occupational Stress Questionnaire    Do you feel stress - tense, restless, nervous, or anxious, or unable to sleep at night because your mind is troubled all the time - these days?: Not at all  Social Connections: Unknown (06/22/2021)   Received from [facility]   Social Network    Social Network: Not on file    Review of Systems Per HPI  Objective:  BP (!) 143/84   Pulse (!) 101   Ht 6' 1.5 (1.867 m)   Wt 240 lb (108.9 kg)   SpO2 98%   BMI 31.23 kg/m      01/12/2024    1:16 PM 12/31/2023  10:53 AM 11/17/2023   10:35 AM  BP/Weight  Systolic BP 143 122 143  Diastolic BP 84 82 89  Wt. (Lbs) 240 234.6 235  BMI 31.23 kg/m2 30.95 kg/m2 31 kg/m2    Physical Exam Vitals and nursing note reviewed.  Constitutional:      General: He is not in acute distress.    Appearance: Normal appearance.  Cardiovascular:     Rate and Rhythm: Normal rate and regular rhythm.  Pulmonary:     Effort: Pulmonary effort is normal.     Breath sounds: Normal breath sounds. No wheezing, rhonchi or rales.  Neurological:     Mental Status: He is alert.  Psychiatric:        Mood and Affect: Mood normal.        Behavior: Behavior normal.     Lab Results  Component Value Date   WBC 3.7 07/08/2023   HGB 14.2 07/08/2023   HCT 42.1 07/08/2023   PLT 246 07/08/2023   GLUCOSE 91 07/08/2023   CHOL 191 07/08/2023   TRIG 72  07/08/2023   HDL 85 07/08/2023   LDLCALC 93 07/08/2023   ALT 21 07/08/2023   AST 27 07/08/2023   NA 138 07/08/2023   K 4.8 07/08/2023   CL 98 07/08/2023   CREATININE 0.87 07/08/2023   BUN 12 07/08/2023   CO2 24 07/08/2023   INR 0.97 12/24/2016   HGBA1C 4.8 07/30/2021     Assessment & Plan:  Chronic midline low back pain without sciatica Assessment & Plan: Prednisone  if needed.   Orders: -     predniSONE ; 50 mg daily x 2 days, then 40 mg daily x 2 days, then 30 mg daily x 2 days, then 20 mg daily x 2 days, then 10 mg daily x 2 days.  Dispense: 30 tablet; Refill: 0  Generalized anxiety disorder Assessment & Plan: Stable.    Follow-up:  Return if symptoms worsen or fail to improve.  Jacqulyn Ahle DO [facility]

## 2024-01-12 NOTE — Assessment & Plan Note (Signed)
 Stable

## 2024-01-12 NOTE — Assessment & Plan Note (Signed)
 Prednisone  if needed.

## 2024-01-13 ENCOUNTER — Other Ambulatory Visit: Payer: Self-pay | Admitting: Cardiology

## 2024-01-19 ENCOUNTER — Ambulatory Visit (INDEPENDENT_AMBULATORY_CARE_PROVIDER_SITE_OTHER)

## 2024-01-19 DIAGNOSIS — J309 Allergic rhinitis, unspecified: Secondary | ICD-10-CM | POA: Diagnosis not present

## 2024-03-03 ENCOUNTER — Ambulatory Visit (INDEPENDENT_AMBULATORY_CARE_PROVIDER_SITE_OTHER)

## 2024-03-03 DIAGNOSIS — J302 Other seasonal allergic rhinitis: Secondary | ICD-10-CM | POA: Diagnosis not present

## 2024-03-09 ENCOUNTER — Ambulatory Visit: Payer: Self-pay | Admitting: Family Medicine

## 2024-03-09 ENCOUNTER — Telehealth: Admitting: Family Medicine

## 2024-03-09 DIAGNOSIS — F419 Anxiety disorder, unspecified: Secondary | ICD-10-CM | POA: Diagnosis not present

## 2024-03-09 DIAGNOSIS — F411 Generalized anxiety disorder: Secondary | ICD-10-CM

## 2024-03-09 MED ORDER — DIAZEPAM 5 MG PO TABS: 5.0000 mg | ORAL_TABLET | Freq: Two times a day (BID) | ORAL | 0 refills | Status: AC | PRN

## 2024-03-09 NOTE — Telephone Encounter (Signed)
 Patient has an appointment today 03/09/2024

## 2024-03-09 NOTE — Progress Notes (Signed)
"   Virtual Visit via Video Note  I connected with Trevor Rollins on 03/09/24 at 11:40 AM EST by a video enabled telemedicine application and verified that I am speaking with the correct person using two identifiers.  Location: Patient: [name] (due to weather) Provider: Office   I discussed the limitations of evaluation and management by telemedicine and the availability of in person appointments. The patient expressed understanding and agreed to proceed.  History of Present Illness: 54 year old male with anxiety presents with worsening anxiety.  Patient states that due to recent weather and he has been unable to get out of his driveway.  He states that work has been difficult as well as he is fearful of falling.  He has had fairly recent back surgery.  He states that as a result, his anxiety has been heightened.  He has had some associated nausea.  Feels overwhelmed.  He is currently staying in a hotel due to weather.  He has left his prescription for diazepam  at his house.  He is requesting this medication today.   Observations/Objective: General: Overall well-appearing.  No acute distress. Psychiatric: Flat affect.  Assessment and Plan: 54 year old male with anxiety presents with exacerbation of anxiety. Brief supply of diazepam  sent in.  We discussed the possibility of increasing his Celexa .  If anxiety continues to be at a heightened state we will increase to 30 mg.  Follow Up Instructions:    I discussed the assessment and treatment plan with the patient. The patient was provided an opportunity to ask questions and all were answered. The patient agreed with the plan and demonstrated an understanding of the instructions.   The patient was advised to call back or seek an in-person evaluation if the symptoms worsen or if the condition fails to improve as anticipated.  I provided 5 minutes of non-face-to-face time during this encounter.   Trevor Rollins G Trevor Turnipseed, DO  "

## 2024-03-09 NOTE — Telephone Encounter (Signed)
 FYI Only or Action Required?: Action required by provider: medication refill request.  Patient was last seen in primary care on 01/12/2024 by Cook, Jayce G, DO.  Called Nurse Triage reporting Panic Attack forgot Valium .  Symptoms began yesterday.  Interventions attempted: Nothing.  Symptoms are: gradually improving.  Triage Disposition: See PCP When Office is Open (Within 3 Days)  Patient/caregiver understands and will follow disposition?:  Pt is in [place] and wanting small refill Valium   sent to [facility] [address] Dr. Lauralyn [phone]  Assisted with MyChart         Message from [place] sent at 03/09/2024  9:19 AM EST  Summary: Full blown panic attack   Reason for Triage: The patient called in stating he is currently out of town in [place] and has been going through a full blown Panic Attack since last night. He states he is prone to have these and has medicine for them but left it at home. He would like to speak with a nurse. I will transfer him to [facility].         Reason for Disposition  [1] Anxiety symptoms AND [2] has not been evaluated for this by doctor (or NP/PA)  Answer Assessment - Initial Assessment Questions 1. CONCERN: Did anything happen that prompted you to call today?      Panic attack  2. ANXIETY SYMPTOMS: Can you describe how you (your loved one; patient) have been feeling? (e.g., tense, restless, panicky, anxious, keyed up, overwhelmed, sense of impending doom).      Anxious about walking and fear of falling 3. ONSET: How long have you been feeling this way? (e.g., hours, days, weeks)     Yesterday evening and worsened this am  4. SEVERITY: How would you rate the level of anxiety? (e.g., 0 - 10; or mild, moderate, severe).     8/10  5. FUNCTIONAL IMPAIRMENT: How have these feelings affected your ability to do daily activities? Have you had more difficulty than usual doing your normal daily activities? (e.g., getting better,  same, worse; self-care, school, work, interactions)     In bed  barely able to go to bathroom  6. HISTORY: Have you felt this way before? Have you ever been diagnosed with an anxiety problem in the past? (e.g., generalized anxiety disorder, panic attacks, PTSD). If Yes, ask: How was this problem treated? (e.g., medicines, counseling, etc.)     Yes - after back surgery  7. RISK OF HARM - SUICIDAL IDEATION: Do you ever have thoughts of hurting or killing yourself? If Yes, ask:  Do you have these feelings now? Do you have a plan on how you would do this?     no 8. TREATMENT:  What has been done so far to treat this anxiety? (e.g., medicines, relaxation strategies). What has helped?     Mindfulness videos, benadryl  9. THERAPIST: Do you have a counselor or therapist? If Yes, ask: What is their name?     no 10. POTENTIAL TRIGGERS: Do you drink caffeinated beverages (e.g., coffee, colas, teas), and how much daily? Do you drink alcohol or use any drugs? Have you started any new medicines recently?       Post back surgery/angry clients/pt under pressure to ger things but is afraid of falling.  11. PATIENT SUPPORT: Who is with you now? Who do you live with? Do you have family or friends who you can talk to?        Sister  20.  OTHER SYMPTOMS: Do you have any other symptoms? (e.g., feeling depressed, trouble concentrating, trouble sleeping, trouble breathing, palpitations or fast heartbeat, chest pain, sweating, nausea, or diarrhea)       Nausea, hands trembling  Protocols used: Anxiety and Panic Attack-A-AH

## 2024-03-10 NOTE — Progress Notes (Signed)
 VIALS MADE ON 03/10/24

## 2024-03-17 ENCOUNTER — Ambulatory Visit (INDEPENDENT_AMBULATORY_CARE_PROVIDER_SITE_OTHER)

## 2024-03-17 DIAGNOSIS — J302 Other seasonal allergic rhinitis: Secondary | ICD-10-CM
